# Patient Record
Sex: Female | Born: 1945
Health system: Southern US, Community
[De-identification: ages and names within clinical notes are randomized; demographics above are authoritative.]

## PROBLEM LIST (undated history)

## (undated) DIAGNOSIS — D496 Neoplasm of unspecified behavior of brain: Secondary | ICD-10-CM

## (undated) DIAGNOSIS — I10 Essential (primary) hypertension: Secondary | ICD-10-CM

## (undated) DIAGNOSIS — J479 Bronchiectasis, uncomplicated: Secondary | ICD-10-CM

## (undated) DIAGNOSIS — T7840XA Allergy, unspecified, initial encounter: Secondary | ICD-10-CM

## (undated) DIAGNOSIS — E119 Type 2 diabetes mellitus without complications: Secondary | ICD-10-CM

## (undated) HISTORY — DX: Type 2 diabetes mellitus without complications: E11.9

## (undated) HISTORY — PX: BREAST BIOPSY: SHX20

## (undated) HISTORY — DX: Allergy, unspecified, initial encounter: T78.40XA

## (undated) HISTORY — PX: ABDOMINAL HYSTERECTOMY: SHX81

## (undated) HISTORY — DX: Neoplasm of unspecified behavior of brain: D49.6

## (undated) HISTORY — PX: HAND SURGERY: SHX662

## (undated) HISTORY — DX: Essential (primary) hypertension: I10

## (undated) HISTORY — DX: Bronchiectasis, uncomplicated: J47.9

## (undated) HISTORY — PX: STOMACH SURGERY: SHX791

---

## 2013-02-22 DIAGNOSIS — E669 Obesity, unspecified: Secondary | ICD-10-CM | POA: Insufficient documentation

## 2013-02-22 DIAGNOSIS — F411 Generalized anxiety disorder: Secondary | ICD-10-CM | POA: Insufficient documentation

## 2013-02-22 DIAGNOSIS — R32 Unspecified urinary incontinence: Secondary | ICD-10-CM | POA: Insufficient documentation

## 2013-06-19 DIAGNOSIS — K219 Gastro-esophageal reflux disease without esophagitis: Secondary | ICD-10-CM | POA: Insufficient documentation

## 2014-01-23 DIAGNOSIS — K5909 Other constipation: Secondary | ICD-10-CM | POA: Insufficient documentation

## 2014-01-23 DIAGNOSIS — E785 Hyperlipidemia, unspecified: Secondary | ICD-10-CM | POA: Insufficient documentation

## 2014-01-23 DIAGNOSIS — I6529 Occlusion and stenosis of unspecified carotid artery: Secondary | ICD-10-CM | POA: Insufficient documentation

## 2014-01-23 DIAGNOSIS — L209 Atopic dermatitis, unspecified: Secondary | ICD-10-CM | POA: Insufficient documentation

## 2014-01-23 DIAGNOSIS — D649 Anemia, unspecified: Secondary | ICD-10-CM | POA: Insufficient documentation

## 2014-01-23 DIAGNOSIS — E7800 Pure hypercholesterolemia, unspecified: Secondary | ICD-10-CM | POA: Insufficient documentation

## 2014-01-23 DIAGNOSIS — J309 Allergic rhinitis, unspecified: Secondary | ICD-10-CM | POA: Insufficient documentation

## 2014-05-15 DIAGNOSIS — E876 Hypokalemia: Secondary | ICD-10-CM | POA: Insufficient documentation

## 2014-09-21 DIAGNOSIS — I10 Essential (primary) hypertension: Secondary | ICD-10-CM | POA: Insufficient documentation

## 2015-01-11 DIAGNOSIS — K5730 Diverticulosis of large intestine without perforation or abscess without bleeding: Secondary | ICD-10-CM | POA: Insufficient documentation

## 2015-02-04 DIAGNOSIS — H43393 Other vitreous opacities, bilateral: Secondary | ICD-10-CM | POA: Insufficient documentation

## 2015-02-04 DIAGNOSIS — H43811 Vitreous degeneration, right eye: Secondary | ICD-10-CM | POA: Insufficient documentation

## 2015-05-25 DIAGNOSIS — Z9889 Other specified postprocedural states: Secondary | ICD-10-CM | POA: Insufficient documentation

## 2015-08-26 DIAGNOSIS — C49A0 Gastrointestinal stromal tumor, unspecified site: Secondary | ICD-10-CM | POA: Insufficient documentation

## 2015-09-14 DIAGNOSIS — N6310 Unspecified lump in the right breast, unspecified quadrant: Secondary | ICD-10-CM | POA: Insufficient documentation

## 2015-09-14 DIAGNOSIS — N2889 Other specified disorders of kidney and ureter: Secondary | ICD-10-CM | POA: Insufficient documentation

## 2015-10-21 DIAGNOSIS — J471 Bronchiectasis with (acute) exacerbation: Secondary | ICD-10-CM | POA: Insufficient documentation

## 2016-01-22 DIAGNOSIS — E11319 Type 2 diabetes mellitus with unspecified diabetic retinopathy without macular edema: Secondary | ICD-10-CM | POA: Insufficient documentation

## 2016-01-22 DIAGNOSIS — H2513 Age-related nuclear cataract, bilateral: Secondary | ICD-10-CM | POA: Insufficient documentation

## 2016-02-01 DIAGNOSIS — R7989 Other specified abnormal findings of blood chemistry: Secondary | ICD-10-CM | POA: Insufficient documentation

## 2016-02-23 DIAGNOSIS — M810 Age-related osteoporosis without current pathological fracture: Secondary | ICD-10-CM | POA: Insufficient documentation

## 2016-06-15 DIAGNOSIS — G8929 Other chronic pain: Secondary | ICD-10-CM | POA: Insufficient documentation

## 2016-12-06 DIAGNOSIS — E119 Type 2 diabetes mellitus without complications: Secondary | ICD-10-CM | POA: Insufficient documentation

## 2017-05-10 DIAGNOSIS — R55 Syncope and collapse: Secondary | ICD-10-CM | POA: Insufficient documentation

## 2018-09-19 DIAGNOSIS — R300 Dysuria: Secondary | ICD-10-CM | POA: Diagnosis not present

## 2018-09-19 DIAGNOSIS — I10 Essential (primary) hypertension: Secondary | ICD-10-CM | POA: Diagnosis not present

## 2018-09-19 DIAGNOSIS — E119 Type 2 diabetes mellitus without complications: Secondary | ICD-10-CM | POA: Diagnosis not present

## 2018-09-19 DIAGNOSIS — R8271 Bacteriuria: Secondary | ICD-10-CM | POA: Diagnosis not present

## 2018-09-19 DIAGNOSIS — E7800 Pure hypercholesterolemia, unspecified: Secondary | ICD-10-CM | POA: Diagnosis not present

## 2018-09-19 DIAGNOSIS — Z794 Long term (current) use of insulin: Secondary | ICD-10-CM | POA: Diagnosis not present

## 2018-09-19 DIAGNOSIS — N3000 Acute cystitis without hematuria: Secondary | ICD-10-CM | POA: Diagnosis not present

## 2018-09-26 DIAGNOSIS — E119 Type 2 diabetes mellitus without complications: Secondary | ICD-10-CM | POA: Diagnosis not present

## 2018-09-26 DIAGNOSIS — I10 Essential (primary) hypertension: Secondary | ICD-10-CM | POA: Diagnosis not present

## 2018-09-26 DIAGNOSIS — R300 Dysuria: Secondary | ICD-10-CM | POA: Diagnosis not present

## 2018-09-26 DIAGNOSIS — E7800 Pure hypercholesterolemia, unspecified: Secondary | ICD-10-CM | POA: Diagnosis not present

## 2018-10-04 ENCOUNTER — Other Ambulatory Visit: Payer: Self-pay

## 2018-10-04 DIAGNOSIS — J471 Bronchiectasis with (acute) exacerbation: Secondary | ICD-10-CM | POA: Diagnosis not present

## 2018-10-04 NOTE — Patient Outreach (Signed)
[facility]) Care Management  10/04/2018  Azhane Eckart 08/06/1946 [id number]   Referral Date: 10/04/2018 Referral Source: Nurse line Referral Reason: trouble breathing   Outreach Attempt: spoke with patient.  She is able to verify HIPAA.  Discussed reason for call.  She states that she did go to the urgent care attached to her pulmonary's office and she was given two prescriptions and she has had them filled.  She states one was an antibiotic but did not have the names. She states she feels some better and getting ready to go out.  Cautioned patient on going out and to make sure she is protected from the cold.  She verbalized understanding and denies any concerns.     Plan: RN CM will send in basket to C-SNP nurse of call and sign off case.      Jone Baseman, RN, MSN [facility] Care Management Care Management Coordinator Direct Line [phone] Toll Free: [phone]  Fax: [phone]

## 2018-10-10 ENCOUNTER — Other Ambulatory Visit: Payer: Self-pay

## 2018-10-10 NOTE — Patient Outreach (Signed)
  [facility]) Care Management Chronic Special Needs Program    10/10/2018  Name: Julea Hutto, DOB: 1945-10-22  MRN: [id number]    [facility]) Care Management Chronic Special Needs Program  10/10/2018  Name: Judie Hollick DOB: July 27, 1946  MRN: [id number]  Ms. Niomi Valent is enrolled in a Chronic Special Needs Plan.   Nurse advice line call received today: troubles with her breathing and has diabetes. Saw pulmonologist last week and prescribed prednisone and azithromycin (per record: finished prescriptions yesterday).   RNCM called follow up on nurse advice call; review health risk assessment and complete individualized care plan. Client answered. She reports she is feeling better then this morning when she called the nurse advice line. She states she has an appointment with her primary care on tomorrow. But states she has company and request a call back to complete call.    Plan: Chronic care management coordinator will attempt outreach in about an hour per client request.   Thea Silversmith, RN, MSN, [place] [phone]

## 2018-10-10 NOTE — Patient Outreach (Signed)
[facility]) Care Management Chronic Special Needs Program  10/10/2018  Name: Candice Pineda DOB: [birth year]  MRN: [id number]  Candice Pineda is enrolled in a chronic special needs plan for Diabetes. Chronic Care Management Coordinator telephoned client to review health risk assessment and to develop individualized care plan.  Introduced the chronic care management program, importance of client participation, and taking their care plan to all provider appointments and inpatient facilities.  Reviewed the transition of care process and possible referral to community care management.  Subjective: client reports she is feeling better, but still has periods of shortness of breath and feeling tired. Client reports she has an appointment with her primary care provider tomorrow.Client reports history of bronchiectasis. She states she is supposed to be using Symbicort inhaler, but is not able to afford it. She denies any other issues with her medications. Client is also requesting advanced directive information.  Outpatient Encounter Medications as of 10/10/2018  Medication Sig  . aspirin EC 81 MG tablet Take 81 mg by mouth daily.  . calcium-vitamin D (OSCAL WITH D) 500-200 MG-UNIT tablet Take 1 tablet by mouth 2 (two) times daily.  . ferrous sulfate 325 (65 FE) MG tablet Take 325 mg by mouth daily with breakfast.  . fluticasone (FLONASE) 50 MCG/ACT nasal spray Place 1 spray into both nostrils daily as needed for allergies or rhinitis.  . folic acid (FOLVITE) 762 MCG tablet Take 400 mcg by mouth daily.  . hydrochlorothiazide (HYDRODIURIL) 25 MG tablet Take 25 mg by mouth daily.  . insulin NPH-regular Human (70-30) 100 UNIT/ML injection Inject 15 Units into the skin 2 (two) times daily with a meal.  . losartan (COZAAR) 50 MG tablet Take 50 mg by mouth daily.  . metFORMIN (GLUCOPHAGE) 500 MG tablet Take 500 mg by mouth 2 (two) times daily with a meal.  . omeprazole (PRILOSEC) 40 MG capsule Take  40 mg by mouth daily.  . potassium chloride SA (K-DUR,KLOR-CON) 20 MEQ tablet Take 20 mEq by mouth 2 (two) times daily.  . simvastatin (ZOCOR) 40 MG tablet Take 40 mg by mouth daily.  . vitamin C (ASCORBIC ACID) 500 MG tablet Take 500 mg by mouth daily.  . budesonide-formoterol (SYMBICORT) 80-4.5 MCG/ACT inhaler Inhale 2 puffs into the lungs 2 (two) times daily.   No facility-administered encounter medications on file as of 10/10/2018.     Goals Addressed            This Visit's Progress   . Client understands the importance of follow-up with providers by attending scheduled visits      . Client will increase activity tolerance as evidence by verbalization of decrease shortness of breath with activity within the next 49months       Follow up with your doctor for recommendations. Silver Sneakers Benefit.     . Client will report abillity to obtain Medications within the next 6 months      . Client will use Assistive Devices as needed and verbalize understanding of device use      . Client will verbalize knowledge of chronic lung disease as evidenced by no ED visits or Inpatient stays related to chronic lung disease       . Client will verbalize knowledge of self management of Hypertension as evidences by BP reading of 140/90 or less; or as defined by provider      . HEMOGLOBIN A1C < 7.0      . Maintain timely refills of diabetic medication as prescribed  within the year .      [place] Obtain annual  Lipid Profile, LDL-C      . Obtain Annual Eye (retinal)  Exam       . Obtain Annual Foot Exam      . Obtain annual screen for micro albuminuria (urine) , nephropathy (kidney problems)      . Obtain Hemoglobin A1C at least 2 times per year      . Visit Primary Care Provider or Endocrinologist at least 2 times per year          Plan:  Send successful outreach letter with a copy of their individualized care plan, Send individual care plan to provider and Send educational material.  Chronic care  management coordination will follow up next month.  Will refer client to:  Social Work and Pharmacy   RNCM will increase to Moderate Tier due to reports of pulmonary issues/difficulty getting inhaler.   Thea Silversmith, RN, MSN, [place] [phone]

## 2018-10-11 ENCOUNTER — Telehealth: Payer: Self-pay | Admitting: Pharmacist

## 2018-10-11 DIAGNOSIS — E119 Type 2 diabetes mellitus without complications: Secondary | ICD-10-CM | POA: Diagnosis not present

## 2018-10-11 DIAGNOSIS — R0602 Shortness of breath: Secondary | ICD-10-CM | POA: Diagnosis not present

## 2018-10-11 DIAGNOSIS — R5383 Other fatigue: Secondary | ICD-10-CM | POA: Diagnosis not present

## 2018-10-11 DIAGNOSIS — R05 Cough: Secondary | ICD-10-CM | POA: Diagnosis not present

## 2018-10-11 NOTE — Patient Outreach (Signed)
[facility]) Care Management  [place]   10/11/2018  Candice Pineda Jun 13, 1946 [id number]  Reason for referral: Medication Assistance with Symbicort  Referral source: [employer] Current insurance:[employer]  PMHx includes but not limited to: COPD, T2DM, HTN, HLD  Outreach:  Successful telephone call with Ms. Maselli.  HIPAA identifiers verified. Patient agreeable to review medications telephonically.  Subjective:  Patient does not have a Symbicort inhaler nor a rescue inhaler. Patient reports she has not tried to fill her Symbicort this year. She reports checking her BG twice daily.   Objective:  Allergies  Allergen Reactions  . Tetanus Toxoids     Arm extremely painful, arm turned red and swollen    Medications Reviewed Today    Reviewed by Elayne Guerin, [facility] (Pharmacist) on 10/11/18 at [place] List Status: <None>  Medication Order Taking? Sig Documenting Provider Last Dose Status Informant  albuterol (VENTOLIN HFA) 108 (90 Base) MCG/ACT inhaler [id number] No INHALE 2 PUFFS EVERY 4 HOURS AS NEEDED WHEEZING AND FOR SHORTNESS OF BREATH [provider] Not Taking Active   aspirin EC 81 MG tablet [id number] Yes Take 81 mg by mouth daily. [provider] Taking Active Self  Blood Glucose Monitoring Suppl (ACCU-CHEK AVIVA) device [id number] Yes Use daily to check blood sugar daily [provider] Taking Active   budesonide-formoterol (SYMBICORT) 80-4.5 MCG/ACT inhaler [id number] No Inhale 2 puffs into the lungs 2 (two) times daily. [provider] Not Taking Active Self  calcium-vitamin D (OSCAL WITH D) 500-200 MG-UNIT tablet [id number] Yes Take 1 tablet by mouth 2 (two) times daily. [provider] Taking Active   ferrous sulfate 325 (65 FE) MG tablet [id number] Yes Take 325 mg by mouth daily with breakfast. [provider] Taking Active Self  fluticasone (FLONASE) 50 MCG/ACT nasal spray  [id number] Yes Place 1 spray into both nostrils daily as needed for allergies or rhinitis. [provider] Taking Active Self  folic acid (FOLVITE) 024 MCG tablet [id number] Yes Take 400 mcg by mouth daily. [provider] Taking Active Self  hydrochlorothiazide (HYDRODIURIL) 25 MG tablet [id number] Yes Take 25 mg by mouth daily. [provider] Taking Active Self  insulin NPH-regular Human (70-30) 100 UNIT/ML injection [id number] Yes Inject 15 Units into the skin 2 (two) times daily with a meal. [provider] Taking Active Self  LORazepam (ATIVAN) 0.5 MG tablet [id number] Yes Take 1/2 - 1 tab po qhs prn anxiety or insomnia [provider] Taking Active   losartan (COZAAR) 50 MG tablet [id number] Yes Take 50 mg by mouth daily. [provider] Taking Active Self  metFORMIN (GLUCOPHAGE) 500 MG tablet [id number] Yes Take 500 mg by mouth 2 (two) times daily with a meal. [provider] Taking Active Self  omeprazole (PRILOSEC) 40 MG capsule [id number] Yes Take 40 mg by mouth daily. [provider] Taking Active Self  potassium chloride SA (K-DUR,KLOR-CON) 20 MEQ tablet [id number] Yes Take 20 mEq by mouth 2 (two) times daily. [provider] Taking Active Self  simvastatin (ZOCOR) 40 MG tablet [id number] Yes Take 40 mg by mouth daily. [provider] Taking Active Self  vitamin C (ASCORBIC ACID) 500 MG tablet [id number] Yes Take 500 mg by mouth daily. [provider] Taking Active Self          Assessment:  Care coordination call to [facility] who report no Symbicort prescription on file.  Care coordination call to Dr. Elijio Miles, patient's pulmonologist. Left  urgent message that patient does not have any active prescriptions for inhalers (specficially Symbicort) at her pharmacy and requested prescription be sent in.   Drugs sorted by system:  Neurologic: lorazepam   Cardiovascular: aspirin 81mg ,  hydrochlorothiazide, losartan, simvastatin  Pulmonary/Allergy: fluticasone nasal   Gastrointestinal: omeprazole   Endocrine: insulin NPH- regular 70/30, metformin  Vitamins/Minerals/Supplements: calcium-vitamin D, ferrous sulfate, folic acid, potassium, vitamin C  Medication Review Findings:  . Omeprazole: patient's bottle omeprazole says take twice a day, but patient is taking once a day . Metformin: could be further optimized to 1000mg  BID given uncontrolled A1c 8.4%  . Insulin NPH-regular: could be optimized on a long-acting regimen      Plan: Will follow up with patient next week to apply for patient assistance.    [place] Faster PharmD Candidate Class of 2020  [facility]

## 2018-10-14 ENCOUNTER — Other Ambulatory Visit: Payer: Self-pay | Admitting: Pharmacist

## 2018-10-14 ENCOUNTER — Other Ambulatory Visit: Payer: Self-pay

## 2018-10-14 NOTE — Patient Outreach (Signed)
  [facility]) Care Management Chronic Special Needs Program    10/14/2018  Name: Candice Pineda, DOB: 1945-11-11  MRN: [id number]   [name] is enrolled in a chronic special needs plan for Diabetes.   RNCM returned call to client-Client left voice message this morning regarding cost of her inhaler. RNCM spoke with [facility] pharmacist, Denyse Amass who has already been in contact today and is working on client's medication issue. RNCM spoke with client and she has no other needs at this time.   Plan: continue to follow as previously scheduled.  Thea Silversmith, RN, MSN, [place] [phone]

## 2018-10-14 NOTE — Patient Outreach (Signed)
[place] [facility]) Care Management  10/14/2018  Bailey Faiella [birth year] [id number]   Patient was called to follow up on medication assistance. HIPAA identifiers were obtained. Patient confirmed that a Spiriva prescription was sent to her pharmacy but when she went to pick it up, the copay was $90.  [facility] pharmacy was called and the Pharmacy Technician said the prescription was filled for generic Symbicort. She was asked to rebill the prescription and fill it for the brand name Symbicort as generic Symbicort is a tier 4 medication on [place] formulary. Brand name Symbicort is a tier 3 medications which carries a $45 copay.   Although patient said she would be able to pay the $45 this time, it is cost prohibitive for her.  If her provider would agree, Symbicort could be substituted for [facility] and the patient could also get Proventil for PRN use if necessary. Both Dulera and Proventil HFA can be obtained from [facility] Patient Assistance Program at no cost.  Patient said she would go and pick up the Symbicort today.  An attempt was made to complete an Extra Help application for the patient today via telephone.  Unfortunately, there was an issue with her Social Security Number according to the website.   Plan: Send a barrier's letter to the patient's provider to request a therapeutic substitution and signature of Patient Assistance Forms for [facility] and Proventil HFA.  Send patient the [employer] Application so she can take it with her to her provider's office.   Since patient will have at least 1 month of therapy, follow up with her before her next visit with her Pulmonologist on 11/04/2018.   Elayne Guerin, PharmD, [place] Clinical Pharmacist [phone]

## 2018-10-14 NOTE — Patient Outreach (Signed)
[place] [facility]) Care Management  10/14/2018  Candice Pineda 20-Sep-1945 [id number]  Successful outreach to the patient on today's date, HIPAA identifiers confirmed. BSW introduced self to the patient and the reason for today's call, indicating the patient had been referred for completion of advance directives. The patient is interested in learning what advance directives are and why they are needed. BSW educated the patient on the importance of an advance directive and situations they would be utilized. The patient is agreeable to BSW mailing an advance directive to her home for review. BSW will plan to outreach the patient in the next three weeks to confirm receipt of resource. BSW will assist the patient with completion if desired by the patient.  Daneen Schick, BSW, CDP [facility] [phone]

## 2018-10-15 ENCOUNTER — Other Ambulatory Visit: Payer: Self-pay | Admitting: Pharmacy Technician

## 2018-10-15 NOTE — Patient Outreach (Signed)
[place] Candice Pineda [facility] Care Management  10/15/2018  [name] 11-21-1945 [id number]    Received patient assistance referral from [place] for [name] and Proventil HFA through [employer] patient assistance program.  Prepared patient and provider portions to be mailed to patient so that patient can take both parts of the application to her upcoming Pulmonary appointment.  Will followup with patient in 5-7 business days to confirm receipt of application.  Shawnda Mauney P. Emersyn Kotarski, [place] Management [phone]

## 2018-10-16 NOTE — Telephone Encounter (Signed)
This encounter was created in error - please disregard.

## 2018-10-21 ENCOUNTER — Other Ambulatory Visit: Payer: Self-pay

## 2018-10-21 NOTE — Patient Outreach (Signed)
  [place] [facility]) Care Management Chronic Special Needs Program    10/21/2018  Name: Candice Pineda, DOB: 1946/03/17  MRN: [id number]   Ms.LoisBracyis enrolled in a chronic special needs plan forDiabetes.  RNCM received voice message from client that she received an application for her inhaler. Client with questions regarding next steps in the process. RNCM relayed information to pharmacy with request to follow up with client.   Plan: RNCM will continue to follow.  Thea Silversmith, RN, MSN, [place] [phone]

## 2018-10-22 ENCOUNTER — Telehealth: Payer: Self-pay | Admitting: Pharmacist

## 2018-10-22 NOTE — Telephone Encounter (Signed)
-----   Message from Luretha Rued, RN sent at 10/21/2018 12:34 PM EST ----- Regarding: received call W[place] Fillers,  I received a call from Ms. Valentina Gu stating that she received the application for the inhaler and she had a question about what she is to do with the application. She mentioned something about a September 16th appointment. Can you follow up with her on this? Thank you for all you do.  Thea Silversmith, RN, MSN, [place] [phone]

## 2018-10-22 NOTE — Patient Outreach (Signed)
[place] [facility]) Care Management  10/22/2018  Teshia Mahone 1946-08-20 [id number]  Patient was called regarding medication assistance. Unfortunately , she did not answer the phone. HIPAA compliant message was left on her voicemail.  An inbasket message was sent from Thea Silversmith stating the patient had questions about her next steps.    The plan from the 10/14/2018 visit was as follows:   Send a barrier's letter to the patient's provider to request a therapeutic substitution and signature of Patient Assistance Forms for [facility] and Proventil HFA.  Send patient the [employer] Application so she can take it with her to her provider's office.   Since patient will have at least 1 month of therapy, follow up with her before her next visit with her Pulmonologist on 11/04/2018.   Plan: Call patient back in 3-5 business days.   Elayne Guerin, PharmD, [place] Clinical Pharmacist [phone]

## 2018-10-24 ENCOUNTER — Other Ambulatory Visit: Payer: Self-pay | Admitting: Pharmacist

## 2018-10-25 ENCOUNTER — Other Ambulatory Visit: Payer: Self-pay

## 2018-10-25 NOTE — Patient Outreach (Signed)
[facility]) Care Management  10/25/2018  Candice Pineda Jul 03, 1946 [id number]   Patient called me back. HIPAA identifiers were obtained. She was educated on taking the patient assistance forms she was given to her provider's office on 11/04/18.     Elayne Guerin, PharmD, [place] Clinical Pharmacist [phone]

## 2018-10-25 NOTE — Patient Outreach (Signed)
[facility] [facility]) Care Management  10/25/2018  Candice Pineda 1946-05-12 [id number]  Unsuccessful outreach to the patient to confirm receipt of mailed resources. BSW left a HIPAA compliant voice message requesting a return call. BSW will attempt to contact the patient within the next four business days.  Daneen Schick, BSW, CDP [facility] [phone]

## 2018-10-28 ENCOUNTER — Ambulatory Visit: Payer: Self-pay

## 2018-10-28 ENCOUNTER — Other Ambulatory Visit: Payer: Self-pay

## 2018-10-28 NOTE — Patient Outreach (Signed)
[place] [facility]) Care Management  10/28/2018  Lacinda Curvin 1946/08/03 [id number]  Second unsuccessful outreach attempt to confirm receipt of mailed advance directive packet and assist with completion. BSW left a HIPAA compliant voice message requesting a return call. BSW will mail the patient an unsuccessful outreach letter. The patient will be contacted within the next week as a third and final outreach attempt.  Daneen Schick, BSW, CDP [facility] [phone]

## 2018-10-30 ENCOUNTER — Ambulatory Visit: Payer: PPO | Admitting: Pharmacist

## 2018-10-31 ENCOUNTER — Ambulatory Visit: Payer: PPO

## 2018-11-01 ENCOUNTER — Ambulatory Visit: Payer: Self-pay

## 2018-11-01 ENCOUNTER — Ambulatory Visit: Payer: Self-pay | Admitting: Pharmacist

## 2018-11-01 ENCOUNTER — Other Ambulatory Visit: Payer: Self-pay

## 2018-11-01 NOTE — Patient Outreach (Signed)
[place] [facility]) Care Management  11/01/2018  Candice Pineda 1946/02/24 [id number]   Third unsuccessful outreach attempt to confirm receipt of mailed advance directive packet and assist with completion. BSW left a HIPAA compliant voice message requesting a return call. Unsuccessful outreach letter mailed by Nadara Eaton, on 10/28/18.  BSW closing case due to inability to contact.   Ronn Melena, BSW Social Worker [phone]

## 2018-11-04 DIAGNOSIS — J310 Chronic rhinitis: Secondary | ICD-10-CM | POA: Diagnosis not present

## 2018-11-04 DIAGNOSIS — J479 Bronchiectasis, uncomplicated: Secondary | ICD-10-CM | POA: Diagnosis not present

## 2018-11-06 ENCOUNTER — Other Ambulatory Visit: Payer: Self-pay | Admitting: Pharmacist

## 2018-11-06 ENCOUNTER — Ambulatory Visit: Payer: Self-pay | Admitting: Pharmacist

## 2018-11-06 NOTE — Patient Outreach (Addendum)
[place] [facility]) Care Management  11/06/2018  Candice Pineda [birth year] [id number]   Patient was called regarding medication assistance and to follow up after her pulmonary appointment at [facility].  According to the note from Dr. Greggory Stallion on 11/04/2018, the patient is still on Symbicort and has not been changed to [facility] as requested. She was started on a prednisone taper.  Patient was sent the actual application to take to her provider's office. A barrier's letter was also sent to her provider's office on 10/14/2018.  Plan:  Call patient back in 5-7 business days.   Elayne Guerin, PharmD, [place] Clinical Pharmacist [phone]

## 2018-11-07 ENCOUNTER — Other Ambulatory Visit: Payer: Self-pay | Admitting: Pharmacist

## 2018-11-07 NOTE — Patient Outreach (Signed)
[place] [facility]) Care Management  11/07/2018  Candice Pineda [birth year] [id number]   Patient called me back. HIPAA identifiers were obtained. Patient confirmed she went to her pulmonology appointment on 11/04/2018 and that she took the patient assistance forms for [facility] to her provider.    A barrier's letter was sent to her provider last month explaining that the patient was having difficulty affording Symbicort but had not spent the required $600 in out-of-pocket medication expenses that is required by the AZ&Me Program. If deemed therapeutically appropriate by her provider, she could be switched to [facility] which is available from [employer] and does not require an out-of-pocket spend. The barriers letter also requested that the signed application be sent back to [facility] for [place] to handle the correspondence with the program. (In an effort to alleviate stress for the provider.)  Her office visit note from 11/04/2018 was reviewed and there was no mention of Dulera.  Patient confirmed that she gave all of her information to her provider's nurse who said she would fax the application in for her.  Patient said her provider said she should be able to get Symbicort at no cost for 1 year.  That is true IF she spends the $600 in out-of-pocket medication expenses (which she has not and proof would need to accompany the application).  Plan: Left a message for Kellie Simmering CMA to give me a call back for clarification Route note to [facility] Follow up in 1 week. Close pharmacy episode if the provider's office will be taking over patient assistance.   Candice Pineda, PharmD, [place] Clinical Pharmacist [phone]

## 2018-11-08 ENCOUNTER — Other Ambulatory Visit: Payer: Self-pay | Admitting: Pharmacist

## 2018-11-08 NOTE — Patient Outreach (Signed)
[place] [facility]) Care Management  11/08/2018  Adhira Jamil [birth year] [id number]   Butch Penny from [place] office called me back. I explained to her why we sent an application for [facility] to their office (because the patient has not met the out-of-pocket expenditure to receive Symbicort at no cost from AZ&Me).    Butch Penny said she would discuss Dulera with Elijio Miles and if approved send the application directly from their office to [employer].  Plan: Close patient's pharmacy case as her medication assistance will be handled by her provider.   Patient is still part of the CSNP program.   Elayne Guerin, PharmD, [place] Clinical Pharmacist [phone]

## 2018-11-11 ENCOUNTER — Other Ambulatory Visit: Payer: Self-pay

## 2018-11-11 ENCOUNTER — Other Ambulatory Visit: Payer: Self-pay | Admitting: Pharmacist

## 2018-11-11 NOTE — Patient Outreach (Signed)
[place] [facility]) Care Management  11/11/2018  Aracely Rickett June 05, 1946 [id number]   Jasmin from Dr. Elane Fritz office called regarding Ruthe Mannan for Ms. Demchak.  Jasmin and I discussed Dulera and Proventil HFA being available from [employer]. Jasmin said she would download the application and complete the process with the patient.  Elayne Guerin, PharmD, [place] Clinical Pharmacist [phone]  (Pharmacy case will remain closed)

## 2018-11-11 NOTE — Patient Outreach (Signed)
  [place] [facility]) Care Management Chronic Special Needs Program  11/11/2018  Name: Candice Pineda DOB: Mar 12, 1946  MRN: [id number]  [name] is enrolled in a chronic special needs plan for Diabetes. Reviewed and updated care plan.  Subjective: Client reports feeling better and reports breathing improved. She reports her doctors are completing paperwork for her inhaler. RNCM reinforced what she needs to do to get more strips for her meter. She denies any other issues or concerns.   Goals Addressed            This Visit's Progress   . Client understands the importance of follow-up with providers by attending scheduled visits   On track   . Client will increase activity tolerance as evidence by verbalization of decrease shortness of breath with activity within the next 37months   On track    Follow up with your doctor for recommendations. Silver Sneakers Benefit.     . Client will report abillity to obtain Medications within the next 6 months   On track   . Client will use Assistive Devices as needed and verbalize understanding of device use   On track   . Client will verbalize knowledge of chronic lung disease as evidenced by no ED visits or Inpatient stays related to chronic lung disease    On track   . Client will verbalize knowledge of self management of Hypertension as evidences by BP reading of 140/90 or less; or as defined by provider   On track   . Maintain timely refills of diabetic medication as prescribed within the year .   On track   . Obtain annual  Lipid Profile, LDL-C   On track   . Obtain Annual Eye (retinal)  Exam    On track   . Obtain Annual Foot Exam   On track   . Obtain annual screen for micro albuminuria (urine) , nephropathy (kidney problems)   On track   . Obtain Hemoglobin A1C at least 2 times per year   On track   . Visit Primary Care Provider or Endocrinologist at least 2 times per year    On track      Plan:  Send updated care plan to  client. Chronic care management coordinator will outreach in:  6 Months   Thea Silversmith, RN, MSN, [place] [phone]   .

## 2018-11-13 DIAGNOSIS — E119 Type 2 diabetes mellitus without complications: Secondary | ICD-10-CM | POA: Diagnosis not present

## 2018-11-13 DIAGNOSIS — L600 Ingrowing nail: Secondary | ICD-10-CM | POA: Diagnosis not present

## 2018-11-15 ENCOUNTER — Ambulatory Visit: Payer: Self-pay | Admitting: Pharmacist

## 2018-11-15 DIAGNOSIS — H524 Presbyopia: Secondary | ICD-10-CM | POA: Diagnosis not present

## 2018-11-15 DIAGNOSIS — H0100A Unspecified blepharitis right eye, upper and lower eyelids: Secondary | ICD-10-CM | POA: Diagnosis not present

## 2018-11-15 DIAGNOSIS — Z794 Long term (current) use of insulin: Secondary | ICD-10-CM | POA: Diagnosis not present

## 2018-11-15 DIAGNOSIS — H52203 Unspecified astigmatism, bilateral: Secondary | ICD-10-CM | POA: Insufficient documentation

## 2018-11-15 DIAGNOSIS — H2513 Age-related nuclear cataract, bilateral: Secondary | ICD-10-CM | POA: Diagnosis not present

## 2018-11-15 DIAGNOSIS — E113292 Type 2 diabetes mellitus with mild nonproliferative diabetic retinopathy without macular edema, left eye: Secondary | ICD-10-CM | POA: Diagnosis not present

## 2018-11-19 ENCOUNTER — Other Ambulatory Visit: Payer: Self-pay | Admitting: Pharmacist

## 2018-11-19 NOTE — Patient Outreach (Signed)
[place] [facility]) Care Management  11/19/2018  Candice Pineda 1946-09-15 [id number]   Patient called to inquire about Symbicort vs Dulera. HIPAA identifies were obtained. Patient said she was approved for LIS. She went to pick up Symbicort from her local pharmacy and her copay was $8.95.  Patient was educated to ask her provider to send all her chronic medications in for 90-day supplies.  Since patient has Full LIS, she will not be affected by the donut hole.  Plan: Patient's case will remain closed.  Elayne Guerin, PharmD, [place] Clinical Pharmacist [phone]

## 2019-01-29 DIAGNOSIS — R5382 Chronic fatigue, unspecified: Secondary | ICD-10-CM | POA: Diagnosis not present

## 2019-01-29 DIAGNOSIS — E119 Type 2 diabetes mellitus without complications: Secondary | ICD-10-CM | POA: Diagnosis not present

## 2019-02-05 DIAGNOSIS — I6521 Occlusion and stenosis of right carotid artery: Secondary | ICD-10-CM | POA: Diagnosis not present

## 2019-02-05 DIAGNOSIS — E119 Type 2 diabetes mellitus without complications: Secondary | ICD-10-CM | POA: Diagnosis not present

## 2019-02-05 DIAGNOSIS — K219 Gastro-esophageal reflux disease without esophagitis: Secondary | ICD-10-CM | POA: Diagnosis not present

## 2019-02-05 DIAGNOSIS — E7800 Pure hypercholesterolemia, unspecified: Secondary | ICD-10-CM | POA: Diagnosis not present

## 2019-02-07 DIAGNOSIS — I6523 Occlusion and stenosis of bilateral carotid arteries: Secondary | ICD-10-CM | POA: Diagnosis not present

## 2019-02-07 DIAGNOSIS — I6521 Occlusion and stenosis of right carotid artery: Secondary | ICD-10-CM | POA: Diagnosis not present

## 2019-02-19 DIAGNOSIS — K640 First degree hemorrhoids: Secondary | ICD-10-CM | POA: Diagnosis not present

## 2019-02-19 DIAGNOSIS — K5903 Drug induced constipation: Secondary | ICD-10-CM | POA: Diagnosis not present

## 2019-02-28 DIAGNOSIS — Z08 Encounter for follow-up examination after completed treatment for malignant neoplasm: Secondary | ICD-10-CM | POA: Diagnosis not present

## 2019-02-28 DIAGNOSIS — Z8509 Personal history of malignant neoplasm of other digestive organs: Secondary | ICD-10-CM | POA: Diagnosis not present

## 2019-02-28 DIAGNOSIS — C49A0 Gastrointestinal stromal tumor, unspecified site: Secondary | ICD-10-CM | POA: Diagnosis not present

## 2019-03-07 DIAGNOSIS — L0292 Furuncle, unspecified: Secondary | ICD-10-CM | POA: Diagnosis not present

## 2019-03-07 DIAGNOSIS — K644 Residual hemorrhoidal skin tags: Secondary | ICD-10-CM | POA: Diagnosis not present

## 2019-03-21 DIAGNOSIS — R21 Rash and other nonspecific skin eruption: Secondary | ICD-10-CM | POA: Diagnosis not present

## 2019-03-26 ENCOUNTER — Other Ambulatory Visit: Payer: Self-pay | Admitting: Pharmacist

## 2019-03-26 NOTE — Patient Outreach (Addendum)
[place] [facility]) Care Management  03/26/2019  Candice Pineda 1945/10/21 [id number]   Patient was called due to statin adherence. Unfortunately, she did not answer her phone. HIPAA compliant message was left on her voicemail.  [facility] said they did not have simvastatin on the patient's profile.    Care Everywhere was researched, simvastatin was still on the medication list from [place] and spoke with Ples Specter, CPhT.  Patient had Simvastatin filled on the following dates for a 30 day supply:  10/14/2018 11/10/2018 3/10/20020 12/16/2018 01/09/2019 02/06/2019 03/05/2019  Since she has LIS, she is eligible to have a 32-month supply filled for a $8.95.  Spoke with Karrie Meres, PharmD. Lennette Bihari said he spoke with Deanne at [facility] and was told they did not have any fill history of Simvastatin.   Plan: Await call back from patient. Call patient back in 5-7 business days.   Elayne Guerin, PharmD, [place] Clinical Pharmacist [phone]

## 2019-04-02 ENCOUNTER — Other Ambulatory Visit: Payer: Self-pay | Admitting: Pharmacist

## 2019-04-02 NOTE — Patient Outreach (Signed)
[place] [facility]) Care Management  04/02/2019  Cait Locust 08-Jul-1946 [id number]   Patient was called about simvastatin adherence. HIPAA identifiers were obtained.  Patient said she was still taking simvastatin despite there not being any fill history to support a refill.    Patient was asked to read off the fill date and she said 07/16/2005.  She said she uses old bottles.  It was discussed that her pharmacy did not have a fill history for simvastatin and also did not have it on her profile.  Patient's provider (Dr[facility] Loistine [facility]as at [facility]) was called and a message was left requesting a new prescription for a 90 day supply of  Simvastatin be sent to the patient's pharmacy.  Patient also wondered about a bill she received from her medical provider.   [facility] Concierge was called with the patient on conference call to decipher her bill.  Plan: Call patient back/follow up  in 7-10 business days about Simvastatin.   Elayne Guerin, PharmD, [place] Clinical Pharmacist [phone]

## 2019-04-04 DIAGNOSIS — Z20828 Contact with and (suspected) exposure to other viral communicable diseases: Secondary | ICD-10-CM | POA: Diagnosis not present

## 2019-04-04 DIAGNOSIS — M25511 Pain in right shoulder: Secondary | ICD-10-CM | POA: Diagnosis not present

## 2019-05-12 ENCOUNTER — Other Ambulatory Visit: Payer: Self-pay

## 2019-05-12 NOTE — Patient Outreach (Signed)
\   [place] [facility]) Care Management Chronic Special Needs Program  05/12/2019  Name: Candice Pineda DOB: 1946/09/11  MRN: [id number]  [name] is enrolled in a Chronic Special Needs Plan. RNCM called to follow up and review individualized care plan. No answer. HIPPA compliant message left.   Plan: Await return call. Chronic care management coordinator will attempt outreach witinin 1-2 weeks, if no return call.  Thea Silversmith, RN, MSN, [place] [phone]

## 2019-05-16 ENCOUNTER — Other Ambulatory Visit: Payer: Self-pay

## 2019-05-16 NOTE — Patient Outreach (Signed)
  [place] [facility]) Care Management Chronic Special Needs Program  05/16/2019  Name: Candice Pineda DOB: 05/26/1946  MRN: [id number]  [name] is enrolled in a Chronic Special Needs Plan. RNCM called to follow up and review individualized care plan. No answer. HIPPA compliant message left. 2nd outreach attempt to follow up.  Plan: Chronic care management coordinator will attempt outreach within 1-2 weeks.    Thea Silversmith, RN, MSN, [place] [phone]

## 2019-05-19 ENCOUNTER — Other Ambulatory Visit: Payer: Self-pay

## 2019-05-19 NOTE — Patient Outreach (Signed)
[place] [facility]) Care Management Chronic Special Needs Program  05/19/2019  Name: Candice Pineda DOB: 1946/04/01  MRN: [id number]  [name] is enrolled in a chronic special needs plan for Diabetes. Outreach to follow up, review and update care plan.  RNCM received return call from client.  Subjective: Client reports she is managing her health. She denies any difficulty with obtaining medications at this time. She reports she has been attending scheduled visits and has upcoming appointments with her pulmonologist on 05/23/2019, foot doctor on 05/29/2019 and primary care provider on 06/16/2019. She reports an eye exam is scheduled for December this year. Client reports her primary issue is that she is unable to pay her total electric bill and request if assistance if available. Client is agreeable to a [facility] social work referral.   Goals Addressed            This Visit's Progress   . COMPLETED: Client understands the importance of follow-up with providers by attending scheduled visits   On track    Voiced understanding    . Client will increase activity tolerance as evidence by verbalization of decrease shortness of breath with activity within the next 53months   On track    Continue to take medications as prescribed. Continue to attend follow up visits as scheduled.     . COMPLETED: Client will report abillity to obtain Medications within the next 6 months   On track    Voiced ability to obtain medications.    . COMPLETED: Client will use Assistive Devices as needed and verbalize understanding of device use   On track    Denies any difficulty with use of glucose meter.    . Client will verbalize knowledge of chronic lung disease as evidenced by no ED visits or Inpatient stays related to chronic lung disease    On track   . COMPLETED: Client will verbalize knowledge of self management of Hypertension as evidences by BP reading of 140/90 or less; or as defined by provider   On  track    Verbalized takes medications as prescribed, attends follow up appointments as scheduled, monitors salt intake.     . COMPLETED: HEMOGLOBIN A1C < 7       Per note Controlled A1C 7.4; Goal <8.    . Maintain timely refills of diabetic medication as prescribed within the year .   On track   . COMPLETED: Obtain annual  Lipid Profile, LDL-C       Done 01/29/2019    . Obtain Annual Eye (retinal)  Exam    On track    Reports upcoming appointment December 2020.    Marland Kitchen Obtain Annual Foot Exam   On track    Client reports upcoming visit scheduled for September 10,2020    . Obtain annual screen for micro albuminuria (urine) , nephropathy (kidney problems)   On track   . COMPLETED: Obtain Hemoglobin A1C at least 2 times per year       Per chart done 09/26/2018 and 02/05/2019    . COMPLETED: Visit Primary Care Provider or Endocrinologist at least 2 times per year        Per chart PCP at least two time/year.       Plan:  Send individual care plan to provider; send updated care plan to client. Chronic care management coordinator will outreach within 6 Months. Will refer to:  Social Work    Thea Silversmith, RN, MSN, [facility] Chronic Care Management Coordinator [place] [phone]   .

## 2019-05-21 ENCOUNTER — Other Ambulatory Visit: Payer: Self-pay

## 2019-05-21 ENCOUNTER — Ambulatory Visit: Payer: Self-pay

## 2019-05-21 NOTE — Patient Outreach (Signed)
[place] [facility]) Care Management  05/21/2019  Nairi Rominger 1946/07/13 [id number]   Social work referral received from [facility], Thea Silversmith, to assist patient with resources for paying past due electricity bill.   Unsuccessful outreach to patient today.  Left voicemail message and mailed Unsuccessful Outreach Letter. Will attempt to reach again within four business days.  Ronn Melena, BSW Social Worker [phone]

## 2019-05-22 ENCOUNTER — Other Ambulatory Visit: Payer: Self-pay

## 2019-05-22 NOTE — Patient Outreach (Signed)
[place] [facility]) Care Management  05/22/2019  Candice Pineda [birth year] [id number]   Social work referral received from [facility], Thea Silversmith, to assist patient with resources for paying past due electricity bill.   Left voicemail message for patient yesterday.  Did receive return call from her but was on phone with another patient at the time.  Attempted to call her back yesterday but she did not answer.   Called again today but had to leave another voicemail message.  Unsuccessful Outreach letter mailed yesterday.  Will make final attempt to reach again within four business days.  Ronn Melena, BSW Social Worker [phone]

## 2019-05-23 ENCOUNTER — Ambulatory Visit: Payer: HMO

## 2019-05-23 DIAGNOSIS — J309 Allergic rhinitis, unspecified: Secondary | ICD-10-CM | POA: Diagnosis not present

## 2019-05-23 DIAGNOSIS — J479 Bronchiectasis, uncomplicated: Secondary | ICD-10-CM | POA: Diagnosis not present

## 2019-05-23 DIAGNOSIS — Z23 Encounter for immunization: Secondary | ICD-10-CM | POA: Diagnosis not present

## 2019-05-27 ENCOUNTER — Other Ambulatory Visit: Payer: Self-pay

## 2019-05-27 NOTE — Patient Outreach (Signed)
[place] [facility]) Care Management  05/27/2019  Candice Pineda 1946-08-27 [id number]   Social work referral received from [facility], Thea Silversmith, to assist patient with resources for paying past due electricity bill.  Successful outreach to patient today.  Patient reports that she was able to "scrape together" the money to pay past due bill.  She did request that a list of resources be mailed to her for future use as she often has difficulty paying all of her bills.  She was provided with contact information for the following:  [place] [phone] [employer] [place] [phone] [place] [phone] [place] [employer] [phone] [place] [phone] [employer] [phone] [place] 211 Just dial 211.    Will follow up with her within the next two weeks to ensure receipt of resources.  Ronn Melena, BSW Social Worker [phone]

## 2019-05-29 DIAGNOSIS — M2042 Other hammer toe(s) (acquired), left foot: Secondary | ICD-10-CM | POA: Diagnosis not present

## 2019-05-29 DIAGNOSIS — E119 Type 2 diabetes mellitus without complications: Secondary | ICD-10-CM | POA: Diagnosis not present

## 2019-05-29 DIAGNOSIS — L603 Nail dystrophy: Secondary | ICD-10-CM | POA: Diagnosis not present

## 2019-05-29 DIAGNOSIS — M2041 Other hammer toe(s) (acquired), right foot: Secondary | ICD-10-CM | POA: Diagnosis not present

## 2019-06-03 DIAGNOSIS — E559 Vitamin D deficiency, unspecified: Secondary | ICD-10-CM | POA: Diagnosis not present

## 2019-06-03 DIAGNOSIS — E119 Type 2 diabetes mellitus without complications: Secondary | ICD-10-CM | POA: Diagnosis not present

## 2019-06-10 ENCOUNTER — Ambulatory Visit: Payer: Self-pay

## 2019-06-10 ENCOUNTER — Other Ambulatory Visit: Payer: Self-pay

## 2019-06-10 DIAGNOSIS — E119 Type 2 diabetes mellitus without complications: Secondary | ICD-10-CM | POA: Diagnosis not present

## 2019-06-10 DIAGNOSIS — E7800 Pure hypercholesterolemia, unspecified: Secondary | ICD-10-CM | POA: Diagnosis not present

## 2019-06-10 DIAGNOSIS — I10 Essential (primary) hypertension: Secondary | ICD-10-CM | POA: Diagnosis not present

## 2019-06-10 DIAGNOSIS — Z0000 Encounter for general adult medical examination without abnormal findings: Secondary | ICD-10-CM | POA: Diagnosis not present

## 2019-06-10 DIAGNOSIS — E559 Vitamin D deficiency, unspecified: Secondary | ICD-10-CM | POA: Diagnosis not present

## 2019-06-10 NOTE — Patient Outreach (Signed)
[place] [facility]) Care Management  06/10/2019  Candice Pineda August 29, 1946 [id number]   Attempted to contact patient to ensure receipt of financial resources mailed on 05/27/19.  Left voicemail message.  Will attempt to reach again within four business days.  Ronn Melena, BSW Social Worker [phone]

## 2019-06-13 ENCOUNTER — Ambulatory Visit: Payer: Self-pay

## 2019-06-13 ENCOUNTER — Other Ambulatory Visit: Payer: Self-pay

## 2019-06-13 NOTE — Patient Outreach (Signed)
[place] [facility]) Care Management  06/13/2019  Tanaysha Maready April 21, 1946 [id number]   Second unsuccessful outreach to patient to ensure receipt of financial resources on 05/27/19.  Will attempt to reach again within four business days.  Ronn Melena, BSW Social Worker [phone]

## 2019-06-16 ENCOUNTER — Other Ambulatory Visit: Payer: Self-pay

## 2019-06-16 ENCOUNTER — Ambulatory Visit: Payer: Self-pay

## 2019-06-16 NOTE — Patient Outreach (Signed)
[place] [facility]) Care Management  06/16/2019  Lajoyce Bonito 07-07-1946 [id number]   Third unsuccessful outreach to patient to ensure receipt of financial resources on 05/27/19.  Closing social work case due to inability to maintain contact.   Ronn Melena, BSW Social Worker [phone]

## 2019-06-24 DIAGNOSIS — S0990XA Unspecified injury of head, initial encounter: Secondary | ICD-10-CM | POA: Diagnosis not present

## 2019-06-24 DIAGNOSIS — M25561 Pain in right knee: Secondary | ICD-10-CM | POA: Diagnosis not present

## 2019-06-24 DIAGNOSIS — S0003XA Contusion of scalp, initial encounter: Secondary | ICD-10-CM | POA: Diagnosis not present

## 2019-06-24 DIAGNOSIS — M542 Cervicalgia: Secondary | ICD-10-CM | POA: Diagnosis not present

## 2019-06-24 DIAGNOSIS — E669 Obesity, unspecified: Secondary | ICD-10-CM | POA: Diagnosis not present

## 2019-06-24 DIAGNOSIS — S8991XA Unspecified injury of right lower leg, initial encounter: Secondary | ICD-10-CM | POA: Diagnosis not present

## 2019-06-24 DIAGNOSIS — S0093XA Contusion of unspecified part of head, initial encounter: Secondary | ICD-10-CM | POA: Diagnosis not present

## 2019-06-24 DIAGNOSIS — S8002XA Contusion of left knee, initial encounter: Secondary | ICD-10-CM | POA: Diagnosis not present

## 2019-06-24 DIAGNOSIS — S8001XA Contusion of right knee, initial encounter: Secondary | ICD-10-CM | POA: Diagnosis not present

## 2019-06-26 DIAGNOSIS — Z1231 Encounter for screening mammogram for malignant neoplasm of breast: Secondary | ICD-10-CM | POA: Diagnosis not present

## 2019-07-02 DIAGNOSIS — M25561 Pain in right knee: Secondary | ICD-10-CM | POA: Diagnosis not present

## 2019-07-02 DIAGNOSIS — M25562 Pain in left knee: Secondary | ICD-10-CM | POA: Diagnosis not present

## 2019-07-02 DIAGNOSIS — W101XXA Fall (on)(from) sidewalk curb, initial encounter: Secondary | ICD-10-CM | POA: Diagnosis not present

## 2019-07-02 DIAGNOSIS — R519 Headache, unspecified: Secondary | ICD-10-CM | POA: Diagnosis not present

## 2019-08-14 DIAGNOSIS — Z1211 Encounter for screening for malignant neoplasm of colon: Secondary | ICD-10-CM | POA: Diagnosis not present

## 2019-09-05 DIAGNOSIS — J479 Bronchiectasis, uncomplicated: Secondary | ICD-10-CM | POA: Diagnosis not present

## 2019-09-05 DIAGNOSIS — J301 Allergic rhinitis due to pollen: Secondary | ICD-10-CM | POA: Diagnosis not present

## 2019-09-09 DIAGNOSIS — R05 Cough: Secondary | ICD-10-CM | POA: Diagnosis not present

## 2019-09-09 DIAGNOSIS — J309 Allergic rhinitis, unspecified: Secondary | ICD-10-CM | POA: Diagnosis not present

## 2019-09-09 DIAGNOSIS — R062 Wheezing: Secondary | ICD-10-CM | POA: Diagnosis not present

## 2019-09-09 DIAGNOSIS — J479 Bronchiectasis, uncomplicated: Secondary | ICD-10-CM | POA: Diagnosis not present

## 2019-09-18 DIAGNOSIS — R05 Cough: Secondary | ICD-10-CM | POA: Diagnosis not present

## 2019-09-18 DIAGNOSIS — R062 Wheezing: Secondary | ICD-10-CM | POA: Diagnosis not present

## 2019-09-18 DIAGNOSIS — R197 Diarrhea, unspecified: Secondary | ICD-10-CM | POA: Diagnosis not present

## 2019-09-18 DIAGNOSIS — R0981 Nasal congestion: Secondary | ICD-10-CM | POA: Diagnosis not present

## 2019-09-18 DIAGNOSIS — J029 Acute pharyngitis, unspecified: Secondary | ICD-10-CM | POA: Diagnosis not present

## 2019-09-18 DIAGNOSIS — Z20828 Contact with and (suspected) exposure to other viral communicable diseases: Secondary | ICD-10-CM | POA: Diagnosis not present

## 2019-09-18 DIAGNOSIS — R5383 Other fatigue: Secondary | ICD-10-CM | POA: Diagnosis not present

## 2019-09-18 DIAGNOSIS — H9209 Otalgia, unspecified ear: Secondary | ICD-10-CM | POA: Diagnosis not present

## 2019-09-19 HISTORY — PX: COLONOSCOPY: SHX174

## 2019-10-07 DIAGNOSIS — Z1329 Encounter for screening for other suspected endocrine disorder: Secondary | ICD-10-CM | POA: Diagnosis not present

## 2019-10-07 DIAGNOSIS — I10 Essential (primary) hypertension: Secondary | ICD-10-CM | POA: Diagnosis not present

## 2019-10-07 DIAGNOSIS — E119 Type 2 diabetes mellitus without complications: Secondary | ICD-10-CM | POA: Diagnosis not present

## 2019-10-07 DIAGNOSIS — E7800 Pure hypercholesterolemia, unspecified: Secondary | ICD-10-CM | POA: Diagnosis not present

## 2019-10-13 DIAGNOSIS — E7800 Pure hypercholesterolemia, unspecified: Secondary | ICD-10-CM | POA: Diagnosis not present

## 2019-10-13 DIAGNOSIS — E119 Type 2 diabetes mellitus without complications: Secondary | ICD-10-CM | POA: Diagnosis not present

## 2019-10-13 DIAGNOSIS — R718 Other abnormality of red blood cells: Secondary | ICD-10-CM | POA: Diagnosis not present

## 2019-10-13 DIAGNOSIS — E113292 Type 2 diabetes mellitus with mild nonproliferative diabetic retinopathy without macular edema, left eye: Secondary | ICD-10-CM | POA: Diagnosis not present

## 2019-10-13 DIAGNOSIS — E559 Vitamin D deficiency, unspecified: Secondary | ICD-10-CM | POA: Diagnosis not present

## 2019-10-14 ENCOUNTER — Other Ambulatory Visit: Payer: Self-pay

## 2019-10-14 NOTE — Patient Outreach (Signed)
  [place] [facility]) Care Management Chronic Special Needs Program    10/14/2019  Name: Candice Pineda, DOB: 31-Oct-1945  MRN: [id number]   [name] is enrolled in a chronic special needs plan for Diabetes. RNCM called to complete health risk assessment and update individualized care plan. RNCM spoke with client who reports this is not a good time and request call be rescheduled. In addition client states someone called her to ask her the questions. However, health risk assessment is not available.   Plan RNCM will follow up call within the next 1-2 weeks to allow time to see if health risk assessment has already been completed.  Thea Silversmith, RN, MSN, [place] [phone]

## 2019-10-21 DIAGNOSIS — B028 Zoster with other complications: Secondary | ICD-10-CM | POA: Diagnosis not present

## 2019-11-04 ENCOUNTER — Other Ambulatory Visit: Payer: Self-pay

## 2019-11-04 NOTE — Patient Outreach (Signed)
[place] [facility]) Care Management Chronic Special Needs Program  11/04/2019  Name: Jlisa Brys DOB: Jun 28, 1946  MRN: [id number]  Ms. Tamikia Siebert is enrolled in a chronic special needs plan for Diabetes. Chronic Care Management Coordinator telephoned client to complete health risk assessment and to update individualized care plan.  Reviewed the chronic care management program, importance of client participation, and taking their care plan to all provider appointments and inpatient facilities.    Subjective: reports history of diabetes, lung problems states bronchiectasis and high blood pressure. She states she is independent with activities of daily living. She states she attends all provider visits and denies any transportation issues. She reports she does not have a blood pressure cuff and is unable to afford one. Client has not completed her advanced directive and is receptive to receiving another packet to complete at her leisure.   Medications reviewed. Client denies any difficulty obtaining  Medications and denies any questions.  Client with a question regarding usage of the OTC card.   Goals Addressed            This Visit's Progress   . COMPLETED: Client will increase activity tolerance as evidence by verbalization of decrease shortness of breath with activity within the next 43months       11/04/2019 client reports since beginning 2020 increase in activity tolerance-continues to be independent with activities of daily living.      . Client will verbalize knowledge of chronic lung disease as evidenced by no ED visits or Inpatient stays related to chronic lung disease    On track    Year 2020-No ED visits or hospitalizations related to chronic lung disease.  It is important for you to attend provider visits as scheduled for recommendations and needed prescriptions. Mailed education: "Bronchiectasis in adults". Please review and call if you have any questions. Discussed the  importance of having prescribed medications/inhalers and using as prescribed.     . Client will verbalize knowledge of self management of Hypertension as evidences by BP reading of 140/90 or less; or as defined by provider   On track    Follow up with your doctor as scheduled. Ask your doctor "what is my target blood pressure range". I am having a Blood Pressure cuff sent to you. Please call if you have any questions.   Monitor your blood pressure periodically and take to your doctor's appointment.  Continue to take your medications as recommended by your provider. Mailed education article: "High blood pressure: what you can do". Please call if you have any questions.    Marland Kitchen HEMOGLOBIN A1C < 8 (pt-stated)       Per chart note: primary care provider goal <8.   A1C 7.6 on 10/07/2019 Diabetes self management actions:  Glucose monitoring per provider recommendations  Eat Healthy  Check feet daily  Visit provider every 3-6 months as directed  Hbg A1C level every 3-6 months.  Eye Exam yearly  Ask your doctor, "what is my Target A1C goal?"  Ask your doctor, "what is my Target blood sugar range?"     . COMPLETED: Maintain timely refills of diabetic medication as prescribed within the year .       2020-Client denies any difficulty obtaining medication.    . Obtain annual  Lipid Profile, LDL-C   On track    Per review done 01/29/2019 Last done 10/07/2019.  It is important to follow up with your primary care provider as scheduled for yearly physical and recommended labs/procedures.    Marland Kitchen  Obtain Annual Eye (retinal)  Exam    On track    Client reports done-"I made all my appointments" It is important to follow up with your primary care provider as scheduled for recommended exam. Next appointment with eye doctor Opthalmology 02/25/2020    . Obtain Annual Foot Exam   On track    Client reports done year 2020.   Per chart foot exam done 10/13/2019 It is important to follow up with your  provider as scheduled for recommended exam.     . Obtain annual screen for micro albuminuria (urine) , nephropathy (kidney problems)   On track    Per client-Done 2020  It is important to follow up with your primary care provider as scheduled for recommended exam and labs.    Illa Level Hemoglobin A1C at least 2 times per year   On track    Per chart done 09/26/2018 and 02/05/2019  It is important to follow up with your primary care provider as scheduled for yearly physical and recommended labs.     . Visit Primary Care Provider or Endocrinologist at least 2 times per year    On track    It is important to follow up with your primary care provider as scheduled for yearly physical and recommended labs/procedures. If you do not have an annual exam scheduled, please call and schedule. Per chart: Next appointment with primary care provider 04/26/2020      Covid-19 precautions discussed. RNCM encouraged client to call 24 hour nurse advice line as needed. RNCM encouraged client to call healthcare concierge for benefits questions. Client encouraged to call RNCM for resource needs or with health questions or concerns.  Warm transfer to health care concierge regarding OTC card usage.  Plan: send care plan to client; send care plan to primary care. send educational material; send blood pressure monitor to client. Send advanced care packet. RNCM will outreach per tier level in 6 months.    Thea Silversmith, RN, MSN, [place] [phone]

## 2019-12-02 DIAGNOSIS — J309 Allergic rhinitis, unspecified: Secondary | ICD-10-CM | POA: Diagnosis not present

## 2019-12-02 DIAGNOSIS — B351 Tinea unguium: Secondary | ICD-10-CM | POA: Diagnosis not present

## 2019-12-02 DIAGNOSIS — M2041 Other hammer toe(s) (acquired), right foot: Secondary | ICD-10-CM | POA: Diagnosis not present

## 2019-12-02 DIAGNOSIS — J479 Bronchiectasis, uncomplicated: Secondary | ICD-10-CM | POA: Diagnosis not present

## 2019-12-02 DIAGNOSIS — M2042 Other hammer toe(s) (acquired), left foot: Secondary | ICD-10-CM | POA: Diagnosis not present

## 2019-12-02 DIAGNOSIS — E119 Type 2 diabetes mellitus without complications: Secondary | ICD-10-CM | POA: Diagnosis not present

## 2019-12-29 ENCOUNTER — Other Ambulatory Visit: Payer: Self-pay

## 2019-12-30 ENCOUNTER — Telehealth: Payer: Self-pay | Admitting: Nurse Practitioner

## 2019-12-30 ENCOUNTER — Encounter: Payer: Self-pay | Admitting: Nurse Practitioner

## 2019-12-30 ENCOUNTER — Ambulatory Visit (INDEPENDENT_AMBULATORY_CARE_PROVIDER_SITE_OTHER): Payer: HMO | Admitting: Nurse Practitioner

## 2019-12-30 VITALS — BP 124/60 | HR 77 | Temp 97.5°F | Ht 70.08 in | Wt 178.8 lb

## 2019-12-30 DIAGNOSIS — Z794 Long term (current) use of insulin: Secondary | ICD-10-CM

## 2019-12-30 DIAGNOSIS — E1169 Type 2 diabetes mellitus with other specified complication: Secondary | ICD-10-CM | POA: Diagnosis not present

## 2019-12-30 DIAGNOSIS — I10 Essential (primary) hypertension: Secondary | ICD-10-CM

## 2019-12-30 DIAGNOSIS — I6529 Occlusion and stenosis of unspecified carotid artery: Secondary | ICD-10-CM

## 2019-12-30 DIAGNOSIS — E7800 Pure hypercholesterolemia, unspecified: Secondary | ICD-10-CM

## 2019-12-30 DIAGNOSIS — R7989 Other specified abnormal findings of blood chemistry: Secondary | ICD-10-CM | POA: Diagnosis not present

## 2019-12-30 LAB — CBC
HCT: 35.8 % — ABNORMAL LOW (ref 36.0–46.0)
Hemoglobin: 11.4 g/dL — ABNORMAL LOW (ref 12.0–15.0)
MCHC: 31.7 g/dL (ref 30.0–36.0)
MCV: 70.3 fl — ABNORMAL LOW (ref 78.0–100.0)
Platelets: 289 10*3/uL (ref 150.0–400.0)
RBC: 5.1 Mil/uL (ref 3.87–5.11)
RDW: 16.4 % — ABNORMAL HIGH (ref 11.5–15.5)
WBC: 9.1 10*3/uL (ref 4.0–10.5)

## 2019-12-30 LAB — IBC + FERRITIN
Ferritin: 166.7 ng/mL (ref 10.0–291.0)
Iron: 69 ug/dL (ref 42–145)
Saturation Ratios: 22.9 % (ref 20.0–50.0)
Transferrin: 215 mg/dL (ref 212.0–360.0)

## 2019-12-30 LAB — HEPATIC FUNCTION PANEL
ALT: 13 U/L (ref 0–35)
AST: 15 U/L (ref 0–37)
Albumin: 3.9 g/dL (ref 3.5–5.2)
Alkaline Phosphatase: 59 U/L (ref 39–117)
Bilirubin, Direct: 0.1 mg/dL (ref 0.0–0.3)
Total Bilirubin: 0.4 mg/dL (ref 0.2–1.2)
Total Protein: 7.2 g/dL (ref 6.0–8.3)

## 2019-12-30 LAB — LIPID PANEL
Cholesterol: 212 mg/dL — ABNORMAL HIGH (ref 0–200)
HDL: 93.2 mg/dL (ref >39.00)
LDL Cholesterol: 106 mg/dL — ABNORMAL HIGH (ref 0–99)
NonHDL: 119.02
Total CHOL/HDL Ratio: 2
Triglycerides: 64 mg/dL (ref 0.0–149.0)
VLDL: 12.8 mg/dL (ref 0.0–40.0)

## 2019-12-30 LAB — MICROALBUMIN / CREATININE URINE RATIO
Creatinine,U: 51.1 mg/dL
Microalb Creat Ratio: 1.4 mg/g (ref 0.0–30.0)
Microalb, Ur: <0.7 mg/dL (ref 0.0–1.9)

## 2019-12-30 LAB — BASIC METABOLIC PANEL
BUN: 21 mg/dL (ref 6–23)
CO2: 31 mEq/L (ref 19–32)
Calcium: 10.1 mg/dL (ref 8.4–10.5)
Chloride: 100 mEq/L (ref 96–112)
Creatinine, Ser: 0.8 mg/dL (ref 0.40–1.20)
GFR: 69.9 mL/min (ref >60.00)
Glucose, Bld: 174 mg/dL — ABNORMAL HIGH (ref 70–99)
Potassium: 4.4 mEq/L (ref 3.5–5.1)
Sodium: 138 mEq/L (ref 135–145)

## 2019-12-30 LAB — HEMOGLOBIN A1C: Hgb A1c MFr Bld: 7.3 % — ABNORMAL HIGH (ref 4.6–6.5)

## 2019-12-30 LAB — TSH: TSH: 0.78 u[IU]/mL (ref 0.35–4.50)

## 2019-12-30 MED ORDER — ONETOUCH ULTRASOFT LANCETS MISC: 1.0000 | Freq: Two times a day (BID) | 3 refills | Status: DC

## 2019-12-30 MED ORDER — ONETOUCH ULTRA VI STRP: 1.0000 | ORAL_STRIP | Freq: Two times a day (BID) | 3 refills | Status: DC

## 2019-12-30 NOTE — Telephone Encounter (Signed)
Patient called to let Candice Pineda know the brand of BP machine she uses is a One Touch Ultra.

## 2019-12-30 NOTE — Progress Notes (Signed)
Subjective:  Patient ID: Candice Pineda, female    DOB: Jan 28, 1946  Age: 74 y.o. MRN: [id number]  CC: Establish Care (New Pt-CPE-Pt had cereal and coffee at 6AM//eye exam due with Constance Haw Tsamis//ont sure on last colonoscopy or where)  HPI Ms. Hutcheson is here to establish care. Her previous pcp was Dr. Loistine Simas with [facility], last OV 10/2019. She is a widow with 2adult children. She lives alone. She has a hx of DM, HTN Hyperlipidemia, carotid stenosis, bronchiectasis, diabetic retinopathy, cataracts, and neuropathy, Elevated Ferritin She has a pulmonologist and retina specialist within [facility]. She plans to maintain these providers  DM: controlled with Hgb A1c of 7.6 Current use of metformin and humulin She denies any hypoglycemic episode, but does not check glucose at home. LDL at goal Negative urine microalbumin  Reviewed previous lab results and OV noted from previous pcp: Dr. Loistine Simas.  Reviewed past Medical, Social and Family history today.  Outpatient Medications Prior to Visit  Medication Sig Dispense Refill  . acyclovir cream (ZOVIRAX) 5 % Apply to affected area BID prn.    . albuterol (VENTOLIN HFA) 108 (90 Base) MCG/ACT inhaler INHALE 2 PUFFS EVERY 4 HOURS AS NEEDED WHEEZING AND FOR SHORTNESS OF BREATH    . aspirin EC 81 MG tablet Take 81 mg by mouth daily.    . Blood Glucose Calibration (ACCU-CHEK AVIVA) SOLN USE AS DIRECTED    . Blood Glucose Monitoring Suppl (ACCU-CHEK AVIVA) device Use daily to check blood sugar daily    . budesonide-formoterol (SYMBICORT) 80-4.5 MCG/ACT inhaler Inhale 2 puffs into the lungs 2 (two) times daily.    . calcium-vitamin D (OSCAL WITH D) 500-200 MG-UNIT tablet Take 1 tablet by mouth 2 (two) times daily.    . cetirizine (ZYRTEC) 10 MG tablet Take 10 mg by mouth daily.    Mariane Baumgarten Sodium (DSS) 100 MG CAPS Take by mouth.    . ferrous sulfate 325 (65 FE) MG tablet Take 325 mg by mouth daily with breakfast.    . fluticasone (FLONASE) 50 MCG/ACT nasal  spray Place 1 spray into both nostrils daily as needed for allergies or rhinitis.    . folic acid (FOLVITE) A999333 MCG tablet Take 400 mcg by mouth daily.    . hydrochlorothiazide (HYDRODIURIL) 25 MG tablet Take 25 mg by mouth daily.    . insulin NPH-regular Human (70-30) 100 UNIT/ML injection Inject 15 Units into the skin 2 (two) times daily with a meal.    . Lidocaine-Hydrocortisone Ace 3-0.5 % CREA Apply twice a day.    Marland Kitchen LORazepam (ATIVAN) 0.5 MG tablet Take 1/2 - 1 tab po qhs prn anxiety or insomnia    . losartan (COZAAR) 50 MG tablet Take 50 mg by mouth daily.    . metFORMIN (GLUCOPHAGE) 500 MG tablet Take 500 mg by mouth 2 (two) times daily with a meal.    . Multiple Vitamin (MULTI-VITAMIN DAILY PO) Take 1 tablet by mouth daily.    Marland Kitchen omeprazole (PRILOSEC) 40 MG capsule Take 40 mg by mouth daily.    . potassium chloride SA (K-DUR,KLOR-CON) 20 MEQ tablet Take 20 mEq by mouth 2 (two) times daily.    . simvastatin (ZOCOR) 40 MG tablet Take 40 mg by mouth daily.    Marland Kitchen triamcinolone ointment (KENALOG) 0.5 % Apply 3-4 times as needed.  Do not use on face.    . vitamin C (ASCORBIC ACID) 500 MG tablet Take 500 mg by mouth daily.    . Accu-Chek FastClix Lancets  MISC Frequency:   Dosage:0     Instructions:  Note:USE TO CHECK BLOOD SUGAR EVERY MORNING FASTING AND 2 HOURS AFTER DINNER    . omeprazole (PRILOSEC) 40 MG capsule Take by mouth.    Glory Rosebush ULTRA test strip CHECK BLOOD SUGAR TWICE DAILY    . potassium chloride SA (KLOR-CON) 20 MEQ tablet TAKE 1 TABLET TWICE DAILY     No facility-administered medications prior to visit.    ROS See HPI  Objective:  BP 124/60   Pulse 77   Temp (!) 97.5 F (36.4 C) (Tympanic)   Ht 5' 10.08" (1.78 m)   Wt 178 lb 12.8 oz (81.1 kg)   SpO2 97%   BMI 25.60 kg/m   BP Readings from Last 3 Encounters:  12/30/19 124/60    Wt Readings from Last 3 Encounters:  12/30/19 178 lb 12.8 oz (81.1 kg)    Physical Exam Constitutional:      Appearance: She is  obese.  Cardiovascular:     Rate and Rhythm: Normal rate and regular rhythm.     Pulses: Normal pulses.     Heart sounds: Normal heart sounds.  Pulmonary:     Effort: Pulmonary effort is normal.     Breath sounds: Normal breath sounds.  Abdominal:     General: Bowel sounds are normal.     Palpations: Abdomen is soft.  Musculoskeletal:     Right lower leg: Edema present.     Left lower leg: Edema present.  Skin:    General: Skin is warm and dry.     Comments: Abnormal foot exam  Neurological:     Mental Status: She is alert and oriented to person, place, and time.  Psychiatric:        Mood and Affect: Mood normal.        Behavior: Behavior normal.     Lab Results  Component Value Date   WBC 9.1 12/30/2019   HGB 11.4 (L) 12/30/2019   HCT 35.8 (L) 12/30/2019   PLT 289.0 12/30/2019   GLUCOSE 174 (H) 12/30/2019   CHOL 212 (H) 12/30/2019   TRIG 64.0 12/30/2019   HDL 93.20 12/30/2019   LDLCALC 106 (H) 12/30/2019   ALT 13 12/30/2019   AST 15 12/30/2019   NA 138 12/30/2019   K 4.4 12/30/2019   CL 100 12/30/2019   CREATININE 0.80 12/30/2019   BUN 21 12/30/2019   CO2 31 12/30/2019   TSH 0.78 12/30/2019   HGBA1C 7.3 (H) 12/30/2019   MICROALBUR <0.7 12/30/2019    Assessment & Plan:  This visit occurred during the SARS-CoV-2 public health emergency.  Safety protocols were in place, including screening questions prior to the visit, additional usage of staff PPE, and extensive cleaning of exam room while observing appropriate contact time as indicated for disinfecting solutions.   Misbah was seen today for establish care.  Diagnoses and all orders for this visit:  Essential hypertension -     Basic metabolic panel -     TSH -     CBC  Type 2 diabetes mellitus with other specified complication, with long-term current use of insulin (HCC) -     Microalbumin / creatinine urine ratio -     Hemoglobin A1c -     Basic metabolic panel -     Lancets (ONETOUCH ULTRASOFT)  lancets; 1 each by Other route in the morning and at bedtime. Use as instructed -     ONETOUCH ULTRA test strip; 1 each  by Other route in the morning and at bedtime. Use as instructed  Hypercholesterolemia -     Hepatic function panel -     Lipid panel  Asymptomatic carotid artery stenosis, unspecified laterality -     Hepatic function panel -     Lipid panel  Elevated ferritin level -     IBC + Ferritin  Elevated ferritin   I have discontinued Davisha Bauer's Accu-Chek FastClix Lancets. I have also changed her OneTouch Ultra. Additionally, I am having her start on onetouch ultrasoft. Lastly, I am having her maintain her vitamin C, calcium-vitamin D, ferrous sulfate, folic acid, fluticasone, aspirin EC, hydrochlorothiazide, losartan, potassium chloride SA, simvastatin, omeprazole, insulin NPH-regular Human, metFORMIN, budesonide-formoterol, Accu-Chek Aviva, albuterol, LORazepam, Multiple Vitamin (MULTI-VITAMIN DAILY PO), acyclovir cream, Accu-Chek Aviva, DSS, Lidocaine-Hydrocortisone Ace, triamcinolone ointment, and cetirizine.  Meds ordered this encounter  Medications  . Lancets (ONETOUCH ULTRASOFT) lancets    Sig: 1 each by Other route in the morning and at bedtime. Use as instructed    Dispense:  200 each    Refill:  3    Order Specific Question:   Supervising Provider    Answer:   Ronnald Nian [id number]  . ONETOUCH ULTRA test strip    Sig: 1 each by Other route in the morning and at bedtime. 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 Denies any hypoglycemic episodes Positive DM retinopathy and cataracts, has retina specialist within [facility]. LDL at goal Negative urine microalbumin  Advised to start glucose monitoring AM and PM F/up in 87month      Relevant Medications   Lancets (ONETOUCH ULTRASOFT) lancets   ONETOUCH ULTRA test strip     Other   Elevated ferritin    resolved      Hypercholesterolemia    Positive carotid stenosis, HTN, and DM LDL at 106 Current use of simvastatin.      Relevant Orders   Hepatic function panel (Completed)   Lipid panel (Completed)    Other Visit Diagnoses    Elevated ferritin level       Relevant Orders   IBC + Ferritin (Completed)       Follow-up: Return in about 3 months (around 03/30/2020) for DM and HTN, hyperlipidemia (F2F, 40mins).  [name], NP

## 2019-12-30 NOTE — Telephone Encounter (Signed)
Lancet and strip sent

## 2019-12-30 NOTE — Patient Instructions (Addendum)
Normal urine microalbuminuria, TSH, iron panel, renal and liver function. CBC indicates anemia HgbA1c of 7.3, DM is controlled. F/up in 72month  Call office with glucometer name Will send strips and lancets then It is important to check glucose before breakfast and before supper.  Hypoglycemia Hypoglycemia is when the sugar (glucose) level in your blood is too low. Signs of low blood sugar may include:  Feeling: ? Hungry. ? Worried or nervous (anxious). ? Sweaty and clammy. ? Confused. ? Dizzy. ? Sleepy. ? Sick to your stomach (nauseous).  Having: ? A fast heartbeat. ? A headache. ? A change in your vision. ? Tingling or no feeling (numbness) around your mouth, lips, or tongue. ? Jerky movements that you cannot control (seizure).  Having trouble with: ? Moving (coordination). ? Sleeping. ? Passing out (fainting). ? Getting upset easily (irritability). Low blood sugar can happen to people who have diabetes and people who do not have diabetes. Low blood sugar can happen quickly, and it can be an emergency. Treating low blood sugar Low blood sugar is often treated by eating or drinking something sugary right away, such as:  Fruit juice, 4-6 oz (120-150 mL).  Regular soda (not diet soda), 4-6 oz (120-150 mL).  Low-fat milk, 4 oz (120 mL).  Several pieces of hard candy.  Sugar or honey, 1 Tbsp (15 mL). Treating low blood sugar if you have diabetes If you can think clearly and swallow safely, follow the 15:15 rule:  Take 15 grams of a fast-acting carb (carbohydrate). Talk with your doctor about how much you should take.  Always keep a source of fast-acting carb with you, such as: ? Sugar tablets (glucose pills). Take 3-4 pills. ? 6-8 pieces of hard candy. ? 4-6 oz (120-150 mL) of fruit juice. ? 4-6 oz (120-150 mL) of regular (not diet) soda. ? 1 Tbsp (15 mL) honey or sugar.  Check your blood sugar 15 minutes after you take the carb.  If your blood sugar is still  at or below 70 mg/dL (3.9 mmol/L), take 15 grams of a carb again.  If your blood sugar does not go above 70 mg/dL (3.9 mmol/L) after 3 tries, get help right away.  After your blood sugar goes back to normal, eat a meal or a snack within 1 hour.  Treating very low blood sugar If your blood sugar is at or below 54 mg/dL (3 mmol/L), you have very low blood sugar (severe hypoglycemia). This may also cause:  Passing out.  Jerky movements you cannot control (seizure).  Losing consciousness (coma). This is an emergency. Do not wait to see if the symptoms will go away. Get medical help right away. Call your local emergency services (911 in the [place] Do not drive yourself to the hospital. If you have very low blood sugar and you cannot eat or drink, you may need a glucagon shot (injection). A family member or friend should learn how to check your blood sugar and how to give you a glucagon shot. Ask your doctor if you need to have a glucagon shot kit at home. Follow these instructions at home: General instructions  Take over-the-counter and prescription medicines only as told by your doctor.  Stay aware of your blood sugar as told by your doctor.  Limit alcohol intake to no more than 1 drink a day for nonpregnant women and 2 drinks a day for men. One drink equals 12 oz of beer (355 mL), 5 oz of wine (148 mL), or 1  oz of hard liquor (44 mL).  Keep all follow-up visits as told by your doctor. This is important. If you have diabetes:   Follow your diabetes care plan as told by your doctor. Make sure you: ? Know the signs of low blood sugar. ? Take your medicines as told. ? Follow your exercise and meal plan. ? Eat on time. Do not skip meals. ? Check your blood sugar as often as told by your doctor. Always check it before and after exercise. ? Follow your sick day plan when you cannot eat or drink normally. Make this plan ahead of time with your doctor.  Share your diabetes care plan  with: ? Your work or school. ? People you live with.  Check your pee (urine) for ketones: ? When you are sick. ? As told by your doctor.  Carry a card or wear jewelry that says you have diabetes. Contact a doctor if:  You have trouble keeping your blood sugar in your target range.  You have low blood sugar often. Get help right away if:  You still have symptoms after you eat or drink something sugary.  Your blood sugar is at or below 54 mg/dL (3 mmol/L).  You have jerky movements that you cannot control.  You pass out. These symptoms may be an emergency. Do not wait to see if the symptoms will go away. Get medical help right away. Call your local emergency services (911 in the [place] Do not drive yourself to the hospital. Summary  Hypoglycemia happens when the level of sugar (glucose) in your blood is too low.  Low blood sugar can happen to people who have diabetes and people who do not have diabetes. Low blood sugar can happen quickly, and it can be an emergency.  Make sure you know the signs of low blood sugar and know how to treat it.  Always keep a source of sugar (fast-acting carb) with you to treat low blood sugar. This information is not intended to replace advice given to you by your health care provider. Make sure you discuss any questions you have with your health care provider. Document Revised: 12/26/2018 Document Reviewed: 10/08/2015 Elsevier Patient Education  2020 [employer].

## 2020-01-01 NOTE — Telephone Encounter (Signed)
Pt notified to pick up lancets and strips from Pharmacy.

## 2020-01-03 NOTE — Assessment & Plan Note (Addendum)
Controlled with HgbA1c at 7.3 Current use of Humulin and merformin. Denies any hypoglycemic episodes Positive DM retinopathy and cataracts, has retina specialist within [facility]. LDL at goal Negative urine microalbumin  Advised to start glucose monitoring AM and PM F/up in 8month

## 2020-01-03 NOTE — Assessment & Plan Note (Signed)
BP at goal with losartan

## 2020-01-03 NOTE — Assessment & Plan Note (Signed)
resolved 

## 2020-01-03 NOTE — Assessment & Plan Note (Signed)
Positive carotid stenosis, HTN, and DM LDL at 106 Current use of simvastatin.

## 2020-01-30 DIAGNOSIS — R062 Wheezing: Secondary | ICD-10-CM | POA: Diagnosis not present

## 2020-01-30 DIAGNOSIS — J309 Allergic rhinitis, unspecified: Secondary | ICD-10-CM | POA: Diagnosis not present

## 2020-01-30 DIAGNOSIS — J479 Bronchiectasis, uncomplicated: Secondary | ICD-10-CM | POA: Diagnosis not present

## 2020-02-03 ENCOUNTER — Telehealth: Payer: Self-pay | Admitting: Nurse Practitioner

## 2020-02-03 NOTE — Telephone Encounter (Signed)
Baldo Ash I just received these and put them on your desk for your initials.

## 2020-02-03 NOTE — Telephone Encounter (Signed)
Erline Levine is calling and stated that she sent over a clearance for aspirin and haven't received it back yet. Fax number [phone]. CB is [phone]

## 2020-02-04 NOTE — Telephone Encounter (Signed)
Form signed.

## 2020-02-04 NOTE — Telephone Encounter (Signed)
Form signed and faxed

## 2020-02-23 ENCOUNTER — Other Ambulatory Visit: Payer: Self-pay

## 2020-02-23 NOTE — Patient Outreach (Signed)
  [facility]) Care Management Chronic Special Needs Program    02/23/2020  Name: Candice Pineda, DOB: 02-15-1946  MRN: [id number]   [name] is enrolled in a chronic special needs plan.  Care Coordination: RNCM received referral from [facility]. RNCM called to clarify referral and spoke with [place]. [name] states client called and request transportation to doctor appointment on 03/03/2020 in [place].    Plan: send referral to social work.  Thea Silversmith, RN, MSN, [place] [phone]

## 2020-02-24 ENCOUNTER — Other Ambulatory Visit: Payer: Self-pay | Admitting: *Deleted

## 2020-02-24 NOTE — Patient Outreach (Signed)
[facility] [facility]) Care Management  02/24/2020  Candice Pineda Sep 02, 1946 [id number]   CSW received high alert referral for pt on yesterday to assist with transportation needs.  CSW contacted pt and confirmed pt idenitity.  CSW introduced self, role and reason for call. Per pt, she can only drive "to the grocery store nearby" and has a daughter recovering from a stroke and unable.  Pt reports having 2 appointments back to back in [place] on 03/03/2020.  CSW has completed TTS transportation request form and await word from them with pt's confirmed plans. Pt appreciative.  Pt also mentioned needing some help with "a nuisance" outside her home.  "There is a tree overgrown and theres a lot of snakes.Marland KitchenMarland KitchenMarland KitchenMarland Kitchen want to get somebody to take care of it".  CSW acknowledged pt's concern and fears related to her safety.  CSW made suggestions to pt to call the city, landlord and/or Legal Aide to seek support/guidance.    CSW will plan to outreach pt again to confirm transportation has been arranged prior to 03/03/2020.  Eduard Clos, MSW, [place] Worker  [place] [phone]

## 2020-02-25 DIAGNOSIS — H52203 Unspecified astigmatism, bilateral: Secondary | ICD-10-CM | POA: Diagnosis not present

## 2020-02-25 DIAGNOSIS — H5201 Hypermetropia, right eye: Secondary | ICD-10-CM | POA: Diagnosis not present

## 2020-02-25 DIAGNOSIS — H35033 Hypertensive retinopathy, bilateral: Secondary | ICD-10-CM | POA: Diagnosis not present

## 2020-02-25 DIAGNOSIS — H524 Presbyopia: Secondary | ICD-10-CM | POA: Diagnosis not present

## 2020-02-25 DIAGNOSIS — H2513 Age-related nuclear cataract, bilateral: Secondary | ICD-10-CM | POA: Diagnosis not present

## 2020-02-25 DIAGNOSIS — E113292 Type 2 diabetes mellitus with mild nonproliferative diabetic retinopathy without macular edema, left eye: Secondary | ICD-10-CM | POA: Diagnosis not present

## 2020-02-25 DIAGNOSIS — H0100A Unspecified blepharitis right eye, upper and lower eyelids: Secondary | ICD-10-CM | POA: Diagnosis not present

## 2020-02-25 DIAGNOSIS — Z794 Long term (current) use of insulin: Secondary | ICD-10-CM | POA: Diagnosis not present

## 2020-03-01 ENCOUNTER — Other Ambulatory Visit: Payer: Self-pay

## 2020-03-01 NOTE — Patient Outreach (Signed)
  [facility] [facility]) Care Management Chronic Special Needs Program    03/01/2020  Name: Candice Pineda, DOB: 03/09/1946  MRN: [id number]   [name] is enrolled in a chronic special needs plan.  Coordination: Spoke with [facility] social worker-confirmed transportation arranged to provider visit.  Plan: outreach as previously scheduled.  Thea Silversmith, RN, MSN, [place] [phone]

## 2020-03-02 ENCOUNTER — Other Ambulatory Visit: Payer: Self-pay | Admitting: *Deleted

## 2020-03-02 NOTE — Patient Outreach (Signed)
[facility]) Care Management  03/02/2020  Candice Pineda 1945/11/28 [id number]   CSW received call this morning from pt inquiring about her transportation arrangements for tomorrow.  CSW communicated with the TTS transportation rep and have confirmed plans for her pickup tomorrow to go to 2 appointments in [facility] and to return home.  CSW called pt back to confirm her understanding of this- she concurs and is reminded to call CSW if problems arise or if other rides are needed prior to follow up call.   Eduard Clos, MSW, [place] Worker  [place] [phone]

## 2020-03-03 DIAGNOSIS — Z8509 Personal history of malignant neoplasm of other digestive organs: Secondary | ICD-10-CM | POA: Diagnosis not present

## 2020-03-03 DIAGNOSIS — Z08 Encounter for follow-up examination after completed treatment for malignant neoplasm: Secondary | ICD-10-CM | POA: Diagnosis not present

## 2020-03-03 DIAGNOSIS — C49A2 Gastrointestinal stromal tumor of stomach: Secondary | ICD-10-CM | POA: Diagnosis not present

## 2020-03-03 DIAGNOSIS — C49A0 Gastrointestinal stromal tumor, unspecified site: Secondary | ICD-10-CM | POA: Diagnosis not present

## 2020-03-12 ENCOUNTER — Other Ambulatory Visit: Payer: Self-pay | Admitting: *Deleted

## 2020-03-12 NOTE — Patient Outreach (Signed)
[facility]) Care Management  03/12/2020  Candice Pineda 1945/11/08 [id number]   CSW spoke with pt who reports her transportation arrangements went great.  CSW inquired about upcoming needs and provided pt with the direct number for pt to call  [facility] to request.([phone])  Per pt, she has a need for a ride in mid July. CSW instructed pt to call a week in advance or more and to call CSW if any issues arise. CSW will sign off at this time and advise PCP and [facility] team of above plans.    Eduard Clos, MSW, [place] Worker  [place] [phone]

## 2020-03-25 ENCOUNTER — Ambulatory Visit (INDEPENDENT_AMBULATORY_CARE_PROVIDER_SITE_OTHER): Payer: HMO

## 2020-03-25 VITALS — Ht 70.0 in | Wt 178.0 lb

## 2020-03-25 DIAGNOSIS — Z0000 Encounter for general adult medical examination without abnormal findings: Secondary | ICD-10-CM

## 2020-03-25 NOTE — Patient Instructions (Addendum)
[name] , Thank you for taking time to come for your Medicare Wellness Visit. I appreciate your ongoing commitment to your health goals. Please review the following plan we discussed and let me know if I can assist you in the future.   Screening recommendations/referrals: Colonoscopy: Completed 03/11/2015 per our discussion.  Mammogram: Completed 06/2019 per our discussion Bone Density: Unsure of last date. To be completed with next mammogram Recommended yearly ophthalmology/optometry visit for glaucoma screening and checkup Recommended yearly dental visit for hygiene and checkup  Vaccinations: Influenza vaccine: Up to date-Due 05/2020 Pneumococcal vaccine: Completed vaccines Tdap vaccine: Allergic Shingles vaccine: Discuss with pharmacy   Covid-19:Completed vaccines  Advanced directives: Discussed. Information mailed  Conditions/risks identified: See problem list  Next appointment: Follow up in one year for your annual wellness visit 03/31/2021 @ 9am  Preventive Care 65 Years and Older, Female Preventive care refers to lifestyle choices and visits with your health care provider that can promote health and wellness. What does preventive care include?  A yearly physical exam. This is also called an annual well check.  Dental exams once or twice a year.  Routine eye exams. Ask your health care provider how often you should have your eyes checked.  Personal lifestyle choices, including:  Daily care of your teeth and gums.  Regular physical activity.  Eating a healthy diet.  Avoiding tobacco and drug use.  Limiting alcohol use.  Practicing safe sex.  Taking low-dose aspirin every day.  Taking vitamin and mineral supplements as recommended by your health care provider. What happens during an annual well check? The services and screenings done by your health care provider during your annual well check will depend on your age, overall health, lifestyle risk factors, and  family history of disease. Counseling  Your health care provider may ask you questions about your:  Alcohol use.  Tobacco use.  Drug use.  Emotional well-being.  Home and relationship well-being.  Sexual activity.  Eating habits.  History of falls.  Memory and ability to understand (cognition).  Work and work Statistician.  Reproductive health. Screening  You may have the following tests or measurements:  Height, weight, and BMI.  Blood pressure.  Lipid and cholesterol levels. These may be checked every 5 years, or more frequently if you are over 52 years old.  Skin check.  Lung cancer screening. You may have this screening every year starting at age 43 if you have a 30-pack-year history of smoking and currently smoke or have quit within the past 15 years.  Fecal occult blood test (FOBT) of the stool. You may have this test every year starting at age 17.  Flexible sigmoidoscopy or colonoscopy. You may have a sigmoidoscopy every 5 years or a colonoscopy every 10 years starting at age 83.  Hepatitis C blood test.  Hepatitis B blood test.  Sexually transmitted disease (STD) testing.  Diabetes screening. This is done by checking your blood sugar (glucose) after you have not eaten for a while (fasting). You may have this done every 1-3 years.  Bone density scan. This is done to screen for osteoporosis. You may have this done starting at age 54.  Mammogram. This may be done every 1-2 years. Talk to your health care provider about how often you should have regular mammograms. Talk with your health care provider about your test results, treatment options, and if necessary, the need for more tests. Vaccines  Your health care provider may recommend certain vaccines, such as:  Influenza  vaccine. This is recommended every year.    Zoster vaccine. You may need this after age 65.  Pneumococcal 13-valent conjugate (PCV13) vaccine. One dose is recommended after age  53.  Pneumococcal polysaccharide (PPSV23) vaccine. One dose is recommended after age 9. Talk to your health care provider about which screenings and vaccines you need and how often you need them. This information is not intended to replace advice given to you by your health care provider. Make sure you discuss any questions you have with your health care provider. Document Released: 10/01/2015 Document Revised: 05/24/2016 Document Reviewed: 07/06/2015 Elsevier Interactive Patient Education  2017 [place] Prevention in the Home Falls can cause injuries. They can happen to people of all ages. There are many things you can do to make your home safe and to help prevent falls. What can I do on the outside of my home?  Regularly fix the edges of walkways and driveways and fix any cracks.  Remove anything that might make you trip as you walk through a door, such as a raised step or threshold.  Trim any bushes or trees on the path to your home.  Use bright outdoor lighting.  Clear any walking paths of anything that might make someone trip, such as rocks or tools.  Regularly check to see if handrails are loose or broken. Make sure that both sides of any steps have handrails.  Any raised decks and porches should have guardrails on the edges.  Have any leaves, snow, or ice cleared regularly.  Use sand or salt on walking paths during winter.  Clean up any spills in your garage right away. This includes oil or grease spills. What can I do in the bathroom?  Use night lights.  Install grab bars by the toilet and in the tub and shower. Do not use towel bars as grab bars.  Use non-skid mats or decals in the tub or shower.  If you need to sit down in the shower, use a plastic, non-slip stool.  Keep the floor dry. Clean up any water that spills on the floor as soon as it happens.  Remove soap buildup in the tub or shower regularly.  Attach bath mats securely with double-sided  non-slip rug tape.  Do not have throw rugs and other things on the floor that can make you trip. What can I do in the bedroom?  Use night lights.  Make sure that you have a light by your bed that is easy to reach.  Do not use any sheets or blankets that are too big for your bed. They should not hang down onto the floor.  Have a firm chair that has side arms. You can use this for support while you get dressed.  Do not have throw rugs and other things on the floor that can make you trip. What can I do in the kitchen?  Clean up any spills right away.  Avoid walking on wet floors.  Keep items that you use a lot in easy-to-reach places.  If you need to reach something above you, use a strong step stool that has a grab bar.  Keep electrical cords out of the way.  Do not use floor polish or wax that makes floors slippery. If you must use wax, use non-skid floor wax.  Do not have throw rugs and other things on the floor that can make you trip. What can I do with my stairs?  Do not leave any items  on the stairs.  Make sure that there are handrails on both sides of the stairs and use them. Fix handrails that are broken or loose. Make sure that handrails are as long as the stairways.  Check any carpeting to make sure that it is firmly attached to the stairs. Fix any carpet that is loose or worn.  Avoid having throw rugs at the top or bottom of the stairs. If you do have throw rugs, attach them to the floor with carpet tape.  Make sure that you have a light switch at the top of the stairs and the bottom of the stairs. If you do not have them, ask someone to add them for you. What else can I do to help prevent falls?  Wear shoes that:  Do not have high heels.  Have rubber bottoms.  Are comfortable and fit you well.  Are closed at the toe. Do not wear sandals.  If you use a stepladder:  Make sure that it is fully opened. Do not climb a closed stepladder.  Make sure that both  sides of the stepladder are locked into place.  Ask someone to hold it for you, if possible.  Clearly mark and make sure that you can see:  Any grab bars or handrails.  First and last steps.  Where the edge of each step is.  Use tools that help you move around (mobility aids) if they are needed. These include:  Canes.  Walkers.  Scooters.  Crutches.  Turn on the lights when you go into a dark area. Replace any light bulbs as soon as they burn out.  Set up your furniture so you have a clear path. Avoid moving your furniture around.  If any of your floors are uneven, fix them.  If there are any pets around you, be aware of where they are.  Review your medicines with your doctor. Some medicines can make you feel dizzy. This can increase your chance of falling. Ask your doctor what other things that you can do to help prevent falls. This information is not intended to replace advice given to you by your health care provider. Make sure you discuss any questions you have with your health care provider. Document Released: 07/01/2009 Document Revised: 02/10/2016 Document Reviewed: 10/09/2014 Elsevier Interactive Patient Education  2017 [employer].

## 2020-03-25 NOTE — Progress Notes (Signed)
Subjective:   Candice Pineda is a 74 y.o. female who presents for an Initial Medicare Annual Wellness Visit.  I connected with [name] today by telephone and verified that I am speaking with the correct person using two identifiers. Location patient: home Location provider: work Persons participating in the virtual visit: patient, Marine scientist.    I discussed the limitations, risks, security and privacy concerns of performing an evaluation and management service by telephone and the availability of in person appointments. I also discussed with the patient that there may be a patient responsible charge related to this service. The patient expressed understanding and verbally consented to this telephonic visit.    Interactive audio and video telecommunications were attempted between this provider and patient, however failed, due to patient having technical difficulties OR patient did not have access to video capability.  We continued and completed visit with audio only.  Some vital signs may be absent or patient reported.   Time Spent with patient on telephone encounter: 35 minutes  Review of Systems      Cardiac Risk Factors include: advanced age (>47men, >56 women);diabetes mellitus;dyslipidemia;hypertension;sedentary lifestyle     Objective:    Today's Vitals   03/25/20 0900  Weight: 178 lb (80.7 kg)  Height: 5\' 10"  (1.778 m)   Body mass index is 25.54 kg/m.  Advanced Directives 03/25/2020 11/04/2019 10/10/2018  Does Patient Have a Medical Advance Directive? No No No  Would patient like information on creating a medical advance directive? Yes (MAU/Ambulatory/Procedural Areas - Information given) Yes (MAU/Ambulatory/Procedural Areas - Information given) -    Current Medications (verified) Outpatient Encounter Medications as of 03/25/2020  Medication Sig  . albuterol (VENTOLIN HFA) 108 (90 Base) MCG/ACT inhaler INHALE 2 PUFFS EVERY 4 HOURS AS NEEDED WHEEZING AND FOR SHORTNESS OF BREATH  .  aspirin EC 81 MG tablet Take 81 mg by mouth daily.  . Blood Glucose Calibration (ACCU-CHEK AVIVA) SOLN USE AS DIRECTED  . Blood Glucose Monitoring Suppl (ACCU-CHEK AVIVA) device Use daily to check blood sugar daily  . budesonide-formoterol (SYMBICORT) 80-4.5 MCG/ACT inhaler Inhale 2 puffs into the lungs 2 (two) times daily.  . calcium-vitamin D (OSCAL WITH D) 500-200 MG-UNIT tablet Take 1 tablet by mouth 2 (two) times daily.  . ferrous sulfate 325 (65 FE) MG tablet Take 325 mg by mouth daily with breakfast.  . folic acid (FOLVITE) 413 MCG tablet Take 400 mcg by mouth daily.  . hydrochlorothiazide (HYDRODIURIL) 25 MG tablet Take 25 mg by mouth daily.  . insulin NPH-regular Human (70-30) 100 UNIT/ML injection Inject 15 Units into the skin 2 (two) times daily with a meal.  . Lancets (ONETOUCH ULTRASOFT) lancets 1 each by Other route in the morning and at bedtime. Use as instructed  . losartan (COZAAR) 50 MG tablet Take 50 mg by mouth daily.  . metFORMIN (GLUCOPHAGE) 500 MG tablet Take 500 mg by mouth 2 (two) times daily with a meal.  . Multiple Vitamin (MULTI-VITAMIN DAILY PO) Take 1 tablet by mouth daily.  Marland Kitchen omeprazole (PRILOSEC) 40 MG capsule Take 40 mg by mouth daily.  Glory Rosebush ULTRA test strip 1 each by Other route in the morning and at bedtime. Use as instructed  . potassium chloride SA (K-DUR,KLOR-CON) 20 MEQ tablet Take 20 mEq by mouth 2 (two) times daily.  . simvastatin (ZOCOR) 40 MG tablet Take 40 mg by mouth daily.  . vitamin C (ASCORBIC ACID) 500 MG tablet Take 500 mg by mouth daily.  Marland Kitchen acyclovir cream (ZOVIRAX)  5 % Apply to affected area BID prn. (Patient not taking: Reported on 03/25/2020)  . cetirizine (ZYRTEC) 10 MG tablet Take 10 mg by mouth daily. (Patient not taking: Reported on 03/25/2020)  . Docusate Sodium (DSS) 100 MG CAPS Take by mouth. (Patient not taking: Reported on 03/25/2020)  . fluticasone (FLONASE) 50 MCG/ACT nasal spray Place 1 spray into both nostrils daily as needed for  allergies or rhinitis. (Patient not taking: Reported on 03/25/2020)  . Lidocaine-Hydrocortisone Ace 3-0.5 % CREA Apply twice a day. (Patient not taking: Reported on 03/25/2020)  . LORazepam (ATIVAN) 0.5 MG tablet Take 1/2 - 1 tab po qhs prn anxiety or insomnia (Patient not taking: Reported on 03/25/2020)  . triamcinolone ointment (KENALOG) 0.5 % Apply 3-4 times as needed.  Do not use on face. (Patient not taking: Reported on 03/25/2020)   No facility-administered encounter medications on file as of 03/25/2020.    Allergies (verified) Montelukast, Tetanus toxoids, and Alendronate   History: Past Medical History:  Diagnosis Date  . Allergy   . Bronchiectasis ([place])   . Diabetes mellitus without complication ([place])   . Hypertension    Past Surgical History:  Procedure Laterality Date  . ABDOMINAL HYSTERECTOMY     Family History  Problem Relation Age of Onset  . Hypertension Sister   . Hypertension Brother   . Stroke Daughter    Social History   Socioeconomic History  . Marital status: Widowed    Spouse name: Not on file  . Number of children: Not on file  . Years of education: Not on file  . Highest education level: Not on file  Occupational History  . Occupation: Retired  Tobacco Use  . Smoking status: Never Smoker  . Smokeless tobacco: Never Used  Vaping Use  . Vaping Use: Never used  Substance and Sexual Activity  . Alcohol use: Yes    Comment: occasional  . Drug use: Not Currently  . Sexual activity: Not Currently  Other Topics Concern  . Not on file  Social History Narrative  . Not on file   Social Determinants of Health   Financial Resource Strain: Medium Risk  . Difficulty of Paying Living Expenses: Somewhat hard  Food Insecurity: No Food Insecurity  . Worried About Charity fundraiser in the Last Year: Never true  . Ran Out of Food in the Last Year: Never true  Transportation Needs: No Transportation Needs  . Lack of Transportation (Medical): No  . Lack of  Transportation (Non-Medical): No  Physical Activity:   . Days of Exercise per Week:   . Minutes of Exercise per Session:   Stress: No Stress Concern Present  . Feeling of Stress : Not at all  Social Connections: Moderately Integrated  . Frequency of Communication with Friends and Family: Three times a week  . Frequency of Social Gatherings with Friends and Family: More than three times a week  . Attends Religious Services: More than 4 times per year  . Active Member of Clubs or Organizations: Yes  . Attends Archivist Meetings: More than 4 times per year  . Marital Status: Widowed    Tobacco Counseling Counseling given: Not Answered   Clinical Intake:  Pre-visit preparation completed: Yes  Pain : No/denies pain     Nutritional Status: BMI 25 -29 Overweight Nutritional Risks: None Diabetes: Yes CBG done?: Yes (133 fasting per pt) CBG resulted in Enter/ Edit results?: No Did pt. bring in CBG monitor from home?: No (virtual visit)  How often do you need to have someone help you when you read instructions, pamphlets, or other written materials from your doctor or pharmacy?: 1 - Never What is the last grade level you completed in school?: high school graduate  Nutrition Risk Assessment:  Has the patient had any N/V/D within the last 2 months?  No  Does the patient have any non-healing wounds?  No  Has the patient had any unintentional weight loss or weight gain?  No   Diabetes:  Is the patient diabetic?  Yes  If diabetic, was a CBG obtained today?  Yes 133 fasting per pt Did the patient bring in their glucometer from home?  No virtual visit How often do you monitor your CBG's? daily.   Financial Strains and Diabetes Management:  Are you having any financial strains with the device, your supplies or your medication? No .  Does the patient want to be seen by Chronic Care Management for management of their diabetes?  No  Would the patient like to be referred to  a Nutritionist or for Diabetic Management?  No   Diabetic Exams:   Diabetic Eye Exam: .02/2020-per patient-waiting on report  Diabetic Foot Exam: Completed 12/30/2019.   Interpreter Needed?: No  Information entered by :: Caroleen Hamman LPN   Activities of Daily Living In your present state of health, do you have any difficulty performing the following activities: 03/25/2020 11/04/2019  Hearing? N N  Vision? N N  Difficulty concentrating or making decisions? N N  Walking or climbing stairs? N N  Dressing or bathing? N N  Doing errands, shopping? N N  Preparing Food and eating ? N N  Using the Toilet? N N  In the past six months, have you accidently leaked urine? N N  Do you have problems with loss of bowel control? N N  Managing your Medications? N N  Managing your Finances? N N  Housekeeping or managing your Housekeeping? N N  Some recent data might be hidden     Immunizations and Health Maintenance Immunization History  Administered Date(s) Administered  . Influenza, High Dose Seasonal PF 05/26/2015  . Influenza,inj,Quad PF,6+ Mos 07/03/2017, 05/23/2019  . PFIZER SARS-COV-2 Vaccination 11/17/2019, 12/15/2019  . Pneumococcal Conjugate-13 01/12/2015, 01/12/2015  . Pneumococcal Polysaccharide-23 08/20/2012, 08/20/2012  . Zoster 09/18/2010, 09/18/2010   Health Maintenance Due  Topic Date Due  . Hepatitis C Screening  Never done  . OPHTHALMOLOGY EXAM  Never done  . DEXA SCAN  Never done    Patient Care Team: [name], [name], NP as PCP - General (Internal Medicine) Luretha Rued, RN as [place] any recent Medical Services you may have received from other than [facility] providers in the past year (date may be approximate).     Assessment:   This is a routine wellness examination for Candice Pineda.  Hearing/Vision screen  Hearing Screening   125Hz  250Hz  500Hz  1000Hz  2000Hz  3000Hz  4000Hz  6000Hz  8000Hz   Right ear:           Left ear:            Comments: No hearing loss  Vision Screening Comments: Last eye exam-02/2020  Dietary issues and exercise activities discussed: Current Exercise Habits: The patient does not participate in regular exercise at present, Exercise limited by: None identified  Goals Addressed            This Visit's Progress   . Patient Stated       Would  like to start exercising and continue eating healthy      Depression Screen PHQ 2/9 Scores 03/25/2020 12/30/2019 11/04/2019 10/10/2018  PHQ - 2 Score 0 0 0 0    Fall Risk Fall Risk  03/25/2020 12/30/2019  Falls in the past year? 0 -  Number falls in past yr: 0 0  Injury with Fall? 0 0  Follow up Falls prevention discussed -    FALL RISK PREVENTION PERTAINING TO THE HOME:  Any stairs in or around the home? No    Home free of loose throw rugs in walkways, pet beds, electrical cords, etc? Yes  Adequate lighting in your home to reduce risk of falls? Yes   ASSISTIVE DEVICES UTILIZED TO PREVENT FALLS:  Life alert? No  Use of a cane, walker or w/c? No  Grab bars in the bathroom? No  Shower chair or bench in shower? No  Elevated toilet seat or a handicapped toilet? No    TIMED UP AND GO:  Was the test performed? No . Virtual visit   Cognitive Function: Patient does word search frequently.     6CIT Screen 03/25/2020  What Year? 0 points  What month? 0 points  What time? 0 points  Count back from 20 0 points  Months in reverse 0 points  Repeat phrase 0 points  Total Score 0    Screening Tests Health Maintenance  Topic Date Due  . Hepatitis C Screening  Never done  . OPHTHALMOLOGY EXAM  Never done  . DEXA SCAN  Never done  . TETANUS/TDAP  03/25/2021 (Originally 11/19/1964)  . INFLUENZA VACCINE  04/18/2020  . HEMOGLOBIN A1C  06/30/2020  . FOOT EXAM  12/29/2020  . COLONOSCOPY  03/10/2025  . COVID-19 Vaccine  Completed  . PNA vac Low Risk Adult  Completed    Qualifies for Shingles Vaccine? Yes  Zostavax completed yes. Due  for Shingrix. Education has been provided regarding the importance of this vaccine. Pt has been advised to call insurance company to determine out of pocket expense. Advised may also receive vaccine at local pharmacy or Health Dept. Verbalized acceptance and understanding.  Tdap: Patient is allergic  Flu Vaccine: Due 05/2020  Pneumococcal Vaccine: Completed vaccines  COVID-19 vaccine:  Vaccines received  Cancer Screenings:  Colorectal Screening: Completed 03/11/2015. Per patient & KPN report.   Mammogram: Completed 06/2019-per patient. Repeat every year;   Bone Density: Patient unsure if or when she has had. She would like to have done when she has her next mammogram in October.  Lung Cancer Screening: (Low Dose CT Chest recommended if Age 3-80 years, 30 pack-year currently smoking OR have quit w/in 15years.) does not qualify.     Additional Screening:  Hepatitis C Screening: does qualify; Discuss with PCP   Dental Screening: Recommended annual dental exams for proper oral hygiene  Community Resource Referral:  CRR required this visit?  Yes   Patient would like information on assistance with utility bill     Plan:  I have personally reviewed and addressed the Medicare Annual Wellness questionnaire and have noted the following in the patient's chart:  A. Medical and social history B. Use of alcohol, tobacco or illicit drugs  C. Current medications and supplements D. Functional ability and status E.  Nutritional status F.  Physical activity G. Advance directives H. List of other physicians I.  Hospitalizations, surgeries, and ER visits in previous 12 months J.  [place] such as hearing and vision if needed, cognitive and  depression L. Referrals and appointments   In addition, I have reviewed and discussed with patient certain preventive protocols, quality metrics, and best practice recommendations. A written personalized care plan for preventive services as  well as general preventive health recommendations were provided to patient.  Due to this being a telephonic visit, the after visit summary with patients personalized plan was offered to patient via mail or my-chart. Per request, patient was mailed a copy of AVS.   Signed,    Marta Antu, LPN   02/23/3418  Nurse Health Advisor    Nurse Notes: None

## 2020-04-06 ENCOUNTER — Ambulatory Visit: Payer: HMO | Admitting: Nurse Practitioner

## 2020-04-12 ENCOUNTER — Other Ambulatory Visit: Payer: Self-pay

## 2020-04-12 ENCOUNTER — Telehealth: Payer: Self-pay | Admitting: Nurse Practitioner

## 2020-04-13 ENCOUNTER — Ambulatory Visit (INDEPENDENT_AMBULATORY_CARE_PROVIDER_SITE_OTHER): Payer: HMO | Admitting: Nurse Practitioner

## 2020-04-13 ENCOUNTER — Encounter: Payer: Self-pay | Admitting: Nurse Practitioner

## 2020-04-13 VITALS — BP 98/60 | HR 77 | Temp 96.5°F | Ht 70.0 in | Wt 183.2 lb

## 2020-04-13 DIAGNOSIS — R350 Frequency of micturition: Secondary | ICD-10-CM | POA: Diagnosis not present

## 2020-04-13 DIAGNOSIS — E7800 Pure hypercholesterolemia, unspecified: Secondary | ICD-10-CM | POA: Diagnosis not present

## 2020-04-13 DIAGNOSIS — R42 Dizziness and giddiness: Secondary | ICD-10-CM | POA: Insufficient documentation

## 2020-04-13 DIAGNOSIS — R700 Elevated erythrocyte sedimentation rate: Secondary | ICD-10-CM | POA: Diagnosis not present

## 2020-04-13 DIAGNOSIS — I952 Hypotension due to drugs: Secondary | ICD-10-CM | POA: Diagnosis not present

## 2020-04-13 DIAGNOSIS — Z794 Long term (current) use of insulin: Secondary | ICD-10-CM | POA: Diagnosis not present

## 2020-04-13 DIAGNOSIS — E162 Hypoglycemia, unspecified: Secondary | ICD-10-CM | POA: Diagnosis not present

## 2020-04-13 DIAGNOSIS — I10 Essential (primary) hypertension: Secondary | ICD-10-CM

## 2020-04-13 DIAGNOSIS — E113292 Type 2 diabetes mellitus with mild nonproliferative diabetic retinopathy without macular edema, left eye: Secondary | ICD-10-CM | POA: Diagnosis not present

## 2020-04-13 DIAGNOSIS — R600 Localized edema: Secondary | ICD-10-CM

## 2020-04-13 LAB — SEDIMENTATION RATE: Sed Rate: 110 mm/hr — ABNORMAL HIGH (ref 0–30)

## 2020-04-13 LAB — BASIC METABOLIC PANEL
BUN: 16 mg/dL (ref 6–23)
CO2: 31 mEq/L (ref 19–32)
Calcium: 10.4 mg/dL (ref 8.4–10.5)
Chloride: 100 mEq/L (ref 96–112)
Creatinine, Ser: 0.76 mg/dL (ref 0.40–1.20)
GFR: 74.11 mL/min (ref >60.00)
Glucose, Bld: 84 mg/dL (ref 70–99)
Potassium: 4.2 mEq/L (ref 3.5–5.1)
Sodium: 138 mEq/L (ref 135–145)

## 2020-04-13 LAB — CBC WITH DIFFERENTIAL/PLATELET
Basophils Relative: 1.4 % (ref 0.0–3.0)
Eosinophils Relative: 0.6 % (ref 0.0–5.0)
HCT: 36.4 % (ref 36.0–46.0)
Hemoglobin: 11.5 g/dL — ABNORMAL LOW (ref 12.0–15.0)
Lymphocytes Relative: 40.8 % (ref 12.0–46.0)
MCHC: 31.6 g/dL (ref 30.0–36.0)
MCV: 70.6 fl — ABNORMAL LOW (ref 78.0–100.0)
Monocytes Relative: 7.3 % (ref 3.0–12.0)
Neutrophils Relative %: 49.9 % (ref 43.0–77.0)
Platelets: 265 10*3/uL (ref 150.0–400.0)
RBC: 5.16 Mil/uL — ABNORMAL HIGH (ref 3.87–5.11)
RDW: 16 % — ABNORMAL HIGH (ref 11.5–15.5)
WBC: 9.5 10*3/uL (ref 4.0–10.5)

## 2020-04-13 LAB — HEMOGLOBIN A1C: Hgb A1c MFr Bld: 7.6 % — ABNORMAL HIGH (ref 4.6–6.5)

## 2020-04-13 LAB — CK: Total CK: 162 U/L (ref 7–177)

## 2020-04-13 MED ORDER — SIMVASTATIN 40 MG PO TABS: 40.0000 mg | ORAL_TABLET | Freq: Every day | ORAL | 3 refills | Status: DC

## 2020-04-13 MED ORDER — LOSARTAN POTASSIUM 25 MG PO TABS: 25.0000 mg | ORAL_TABLET | Freq: Every day | ORAL | 3 refills | Status: DC

## 2020-04-13 NOTE — Patient Instructions (Signed)
You will be contacted to schedule appt for echocardiogram and carotid US.  Go to lab for blood draw  Stop novolin due to hypoglycemia Decrease losartant to 25mg  due to hypotension.  Maintain DASH diet and adequate oral hydration.  Check glucose 3x/week in AM (before breakfast)  F/up in 2weeks (video)

## 2020-04-13 NOTE — Assessment & Plan Note (Signed)
Low BP noted today. Possibly related to dizziness and fatigue? BP Readings from Last 3 Encounters:  04/13/20 (!) 98/60  12/30/19 124/60   Decrease losartan to 25mg  Continue HCTZ and potassium due to chronic LE edema Repeat BMP Advised to maintain adquate oral hydration and DASh diet F/up in 2weeks

## 2020-04-13 NOTE — Assessment & Plan Note (Addendum)
Chronic, intermittent, worsening in last 10month reports syncopal episode in 2020 due to vertigo. Denies any cardiac work up in past.  CT head completed (no acute finding) carotid duplex done (bilateral 21-39% stenosis).  Unsure is related to hypoglycemic or hypotension or anemia? Ordered echocardiogram Repeat BMP, and CBC Check ESR. Made medication changes

## 2020-04-13 NOTE — Assessment & Plan Note (Signed)
Reports AM hypoglycemia:70s, symptomatic Does not check glucose daily due to finger callus and pain. Takes novolin 15units BID and metformin.  D/c novolin Continue metformin Repeat HgbA1c Check glucose 3x/week in AM F/up in 2weeks

## 2020-04-13 NOTE — Progress Notes (Addendum)
Subjective:  Patient ID: Candice Pineda, female    DOB: 01/16/1946  Age: 74 y.o. MRN: [id number]  CC: Hypertension (3 month follow up/DM)  Dizziness This is a recurrent problem. The current episode started more than 1 month ago. The problem occurs intermittently. The problem has been waxing and waning. Associated symptoms include vertigo. Pertinent negatives include no chills, diaphoresis, fatigue, fever, headaches, myalgias, nausea, numbness, urinary symptoms, visual change, vomiting or weakness. The symptoms are aggravated by bending, walking and twisting. She has tried nothing for the symptoms.  Urinary Frequency  This is a new problem. The current episode started 1 to 4 weeks ago. The problem occurs every urination. The problem has been unchanged. The pain is at a severity of 0/10. There is no history of pyelonephritis. Associated symptoms include frequency and urgency. Pertinent negatives include no chills, discharge, flank pain, hematuria, hesitancy, nausea, possible pregnancy, sweats or vomiting. She has tried nothing for the symptoms. There is no history of catheterization, kidney stones, recurrent UTIs or a urological procedure.  reports syncopal episode in 2020 due to vertigo. Denies any cardiac work up in past.  Denies any palpitations or chest pain. CT head completed (no acute finding) carotid duplex done (bilateral 21-39% stenosis).  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 Takes novolin 15units BID and metformin.  D/c novolin Continue metformin Repeat HgbA1c Check glucose 3x/week in AM F/up in 2weeks  Essential hypertension Low BP noted today. Possibly related to dizziness and fatigue? BP Readings from Last 3 Encounters:  04/13/20 (!) 98/60  12/30/19 124/60    Decrease losartan to 16m Continue HCTZ and potassium due to chronic LE edema Repeat BMP Advised to maintain adquate oral hydration and DASh diet F/up in 2weeks  Dizziness Chronic, intermittent, worsening in last 139monthreports syncopal episode in 2020 due to vertigo. Denies any cardiac work up in past.  CT head completed (no acute finding) carotid duplex done (bilateral 21-39% stenosis).  Unsure is related to hypoglycemic or hypotension or anemia? Ordered echocardiogram Repeat BMP, and CBC Check ESR. Made medication changes  Reviewed past Medical, Social and Family history today.  Outpatient Medications Prior to Visit  Medication Sig Dispense Refill  . albuterol (VENTOLIN HFA) 108 (90 Base) MCG/ACT inhaler INHALE 2 PUFFS EVERY 4 HOURS AS NEEDED WHEEZING AND FOR SHORTNESS OF BREATH    . Blood Glucose Calibration (ACCU-CHEK AVIVA) SOLN USE AS DIRECTED    . Blood Glucose Monitoring Suppl (ACCU-CHEK AVIVA) device Use daily to check blood sugar daily    . budesonide-formoterol (SYMBICORT) 80-4.5 MCG/ACT inhaler Inhale 2 puffs into the lungs 2 (two) times daily.    . calcium-vitamin D (OSCAL WITH D) 500-200 MG-UNIT tablet Take 1 tablet by mouth 2 (two) times daily.    . ferrous sulfate 325 (65 FE) MG tablet Take 325 mg by mouth daily with breakfast.    . fluticasone (FLONASE) 50 MCG/ACT nasal spray Place 1 spray into both nostrils daily as needed for allergies or rhinitis.     . folic acid (FOLVITE) 40258CG tablet Take 400 mcg by mouth daily.    . hydrochlorothiazide (HYDRODIURIL) 25 MG tablet Take 25 mg by mouth daily.    . Lancets (ONETOUCH ULTRASOFT) lancets 1 each by Other route in the morning and at bedtime. Use as instructed 200 each 3  .  metFORMIN (GLUCOPHAGE) 500 MG tablet Take 500 mg by mouth 2 (two) times daily with a meal.    . Multiple Vitamin (MULTI-VITAMIN DAILY PO) Take 1 tablet by mouth daily.    Marland Kitchen omeprazole (PRILOSEC) 40 MG capsule Take 40 mg by mouth daily.    Glory Rosebush ULTRA test strip 1 each by Other route in the morning and at bedtime. Use as instructed 200 each 3  . potassium chloride SA (K-DUR,KLOR-CON) 20 MEQ tablet Take 20 mEq by mouth 2 (two) times daily.    . insulin NPH-regular Human (70-30) 100 UNIT/ML injection Inject 15 Units into the skin 2 (two) times daily with a meal.    . losartan (COZAAR) 50 MG tablet Take 50 mg by mouth daily.    . simvastatin (ZOCOR) 40 MG tablet Take 40 mg by mouth daily.    Marland Kitchen aspirin EC 81 MG tablet Take 81 mg by mouth daily.    Marland Kitchen triamcinolone ointment (KENALOG) 0.5 % Apply 3-4 times as needed.  Do not use on face.    Marland Kitchen acyclovir cream (ZOVIRAX) 5 % Apply to affected area BID prn. (Patient not taking: Reported on 03/25/2020)    . cetirizine (ZYRTEC) 10 MG tablet Take 10 mg by mouth daily. (Patient not taking: Reported on 03/25/2020)    . Docusate Sodium (DSS) 100 MG CAPS Take by mouth. (Patient not taking: Reported on 03/25/2020)    . Lidocaine-Hydrocortisone Ace 3-0.5 % CREA Apply twice a day. (Patient not taking: Reported on 03/25/2020)    . LORazepam (ATIVAN) 0.5 MG tablet Take 1/2 - 1 tab po qhs prn anxiety or insomnia (Patient not taking: Reported on 03/25/2020)    . vitamin C (ASCORBIC ACID) 500 MG tablet Take 500 mg by mouth daily. (Patient not taking: Reported on 04/13/2020)     No facility-administered medications prior to visit.    ROS See HPI  Objective:  BP (!) 98/60 (BP Location: Left Arm, Patient Position: Sitting, Cuff Size: Normal)   Pulse 77   Temp (!) 96.5 F (35.8 C) (Temporal)   Ht _0  (1.778 m)   Wt 183 lb 3.2 oz (83.1 kg)   SpO2 99%   BMI 26.29 kg/m   Physical Exam Eyes:     Extraocular Movements: Extraocular movements intact.     Conjunctiva/sclera: Conjunctivae normal.     Pupils: Pupils are equal, round, and reactive to light.  Cardiovascular:     Rate and Rhythm: Normal rate and regular rhythm.     Pulses:          Carotid pulses are 0 on the right side and 0 on the left  side.    Heart sounds: Normal heart sounds.  Pulmonary:     Effort: Pulmonary effort is normal.     Breath sounds: Normal breath sounds.  Musculoskeletal:     Right lower leg: Edema present.     Left lower leg: Edema present.  Skin:    Findings: No erythema.  Neurological:     Mental Status: She is alert and oriented to person, place, and time.     Cranial Nerves: No cranial nerve deficit.  Psychiatric:        Mood and Affect: Mood normal.        Behavior: Behavior normal.        Thought Content: Thought content normal.     Assessment & Plan:  This visit occurred during the SARS-CoV-2 public health emergency.  Safety protocols were in place, including  screening questions prior to the visit, additional usage of staff PPE, and extensive cleaning of exam room while observing appropriate contact time as indicated for disinfecting solutions.   Kingslee was seen today for hypertension.  Diagnoses and all orders for this visit:  Essential hypertension -     losartan (COZAAR) 25 MG tablet; Take 1 tablet (25 mg total) by mouth daily. 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 -     CBC w/Diff  Hypoglycemia  Hypercholesterolemia -     simvastatin (ZOCOR) 40 MG tablet; Take 1 tablet (40 mg total) by mouth daily. -     CK (Creatine Kinase)  Dizziness -     CBC w/Diff -     Sedimentation rate -     Cancel: VAS US CAROTID; Future -     ECHOCARDIOGRAM COMPLETE; Future -     predniSONE (DELTASONE) 20 MG tablet; Take 2 tablets (40 mg total) by mouth daily with breakfast for 5 days. -     Sedimentation rate; Future -     C-reactive protein; Future -     Ambulatory referral to  Neurology  Bilateral leg edema -     ECHOCARDIOGRAM COMPLETE; Future  Urinary frequency -     Urinalysis w microscopic + reflex cultur  Elevated sed rate -     predniSONE (DELTASONE) 20 MG tablet; Take 2 tablets (40 mg total) by mouth daily with breakfast for 5 days. -     Sedimentation rate; Future -     C-reactive protein; Future -     Ambulatory referral to Neurology  Other orders -     REFLEXIVE URINE CULTURE    Problem List Items Addressed This Visit      Cardiovascular and Mediastinum   Essential hypertension - Primary    Low BP noted today. Possibly related to dizziness and fatigue? 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 Takes novolin 15units BID and metformin.  D/c novolin Continue metformin Repeat HgbA1c Check glucose 3x/week in AM F/up in 2weeks      Relevant Medications   losartan (COZAAR) 25 MG tablet   simvastatin (ZOCOR) 40 MG tablet   Other Relevant Orders   Hemoglobin A1c (Completed)     Other   Dizziness    Chronic, intermittent, worsening in last 160monthreports syncopal episode in 2020 due to vertigo. Denies any cardiac work up in past.  CT head completed (no acute finding) carotid  duplex done (bilateral 21-39% stenosis).  Unsure is related to hypoglycemic or hypotension or anemia? Ordered  echocardiogram Repeat BMP, and CBC Check ESR. Made medication changes      Relevant Medications   predniSONE (DELTASONE) 20 MG tablet   Other Relevant Orders   CBC w/Diff (Completed)   Sedimentation rate (Completed)   ECHOCARDIOGRAM COMPLETE   Sedimentation rate   C-reactive protein   Ambulatory referral to Neurology   Hypercholesterolemia   Relevant Medications   losartan (COZAAR) 25 MG tablet   simvastatin (ZOCOR) 40 MG tablet   Other Relevant Orders   CK (Creatine Kinase) (Completed)    Other Visit Diagnoses    Hypotension due to drugs       Relevant Medications   losartan (COZAAR) 25 MG tablet   simvastatin (ZOCOR) 40 MG tablet   Other Relevant Orders   CBC w/Diff (Completed)   Hypoglycemia       Bilateral leg edema       Relevant Orders   ECHOCARDIOGRAM COMPLETE   Urinary frequency       Relevant Orders   Urinalysis w microscopic + reflex cultur (Completed)   Elevated sed rate       Relevant Medications   predniSONE (DELTASONE) 20 MG tablet   Other Relevant Orders   Sedimentation rate   C-reactive protein   Ambulatory referral to Neurology      I have spent 60 mins with this patient regarding history taking, documentation, review of previous labs and radiology from previous pcp with [facility], formulating plan and discussing treatment options with patient.  Follow-up: Return in about 2 weeks (around 04/27/2020) for DM (video, 52mns).  CWilfred Lacy NP

## 2020-04-14 LAB — URINALYSIS W MICROSCOPIC + REFLEX CULTURE
Bacteria, UA: NONE SEEN /HPF
Bilirubin Urine: NEGATIVE
Glucose, UA: NEGATIVE
Hgb urine dipstick: NEGATIVE
Hyaline Cast: NONE SEEN /LPF
Ketones, ur: NEGATIVE
Leukocyte Esterase: NEGATIVE
Nitrites, Initial: NEGATIVE
Protein, ur: NEGATIVE
RBC / HPF: NONE SEEN /HPF (ref 0–2)
Specific Gravity, Urine: 1.011 (ref 1.001–1.03)
Squamous Epithelial / HPF: NONE SEEN /HPF (ref <=5)
WBC, UA: NONE SEEN /HPF (ref 0–5)
pH: <=5 (ref 5.0–8.0)

## 2020-04-14 LAB — NO CULTURE INDICATED

## 2020-04-14 NOTE — Telephone Encounter (Signed)
Error

## 2020-04-15 MED ORDER — PREDNISONE 20 MG PO TABS: 40.0000 mg | ORAL_TABLET | Freq: Every day | ORAL | 0 refills | Status: AC

## 2020-04-15 NOTE — Addendum Note (Signed)
Addended by: Leana Gamer on: 04/15/2020 05:22 PM   Modules accepted: Orders

## 2020-04-16 NOTE — Progress Notes (Signed)
Patient verbally understood lab results appointment scheduled for repeat labs. Per patient she will pick up Rx today and start on them.

## 2020-04-21 ENCOUNTER — Ambulatory Visit: Payer: HMO | Admitting: Neurology

## 2020-04-21 ENCOUNTER — Encounter: Payer: Self-pay | Admitting: Neurology

## 2020-04-21 VITALS — Ht 70.0 in | Wt 179.5 lb

## 2020-04-21 DIAGNOSIS — G4719 Other hypersomnia: Secondary | ICD-10-CM | POA: Diagnosis not present

## 2020-04-21 DIAGNOSIS — I951 Orthostatic hypotension: Secondary | ICD-10-CM

## 2020-04-21 DIAGNOSIS — R42 Dizziness and giddiness: Secondary | ICD-10-CM | POA: Diagnosis not present

## 2020-04-21 DIAGNOSIS — R55 Syncope and collapse: Secondary | ICD-10-CM | POA: Diagnosis not present

## 2020-04-21 MED ORDER — PYRIDOSTIGMINE BROMIDE 60 MG PO TABS
60.0000 mg | ORAL_TABLET | Freq: Three times a day (TID) | ORAL | 5 refills | Status: DC
Start: 2020-04-21 — End: 2020-11-05

## 2020-04-21 NOTE — Progress Notes (Signed)
[facility]  PATIENT: Candice Pineda DOB: Aug 14, 1946  REFERRING DOCTOR OR PCP: [name], NP SOURCE: Patient, notes from primary care, lab reports  _________________________________   HISTORICAL  CHIEF COMPLAINT:  Chief Complaint  Patient presents with  . New Patient (Initial Visit)    RM 12, alone. Internal referral from [name], NP at [facility] for dizziness and increased sed rate. Had more than one syncopal episode in 2020. Dizziness occurs with bending, walking and twisting. Saw PCP on 04/13/20 and she had low BP reading then and BP med adjusted. Pt has not been as dizzy since seeing PCP. She reports being more tired than normal. Has difficulty with breathing. Sees pulmonologist, on symbicort. Has echocardiogram scheduled for 04/26/20.     HISTORY OF PRESENT ILLNESS:  I had the pleasure of seeing patient, Candice Pineda, at [facility] for neurologic consultation regarding her episodes of syncope and lightheadedness.  She is a 74 year old woman with several episodes of syncope and many episodes of presyncope.   When she stands up, she often feels lightheaded and does worse as she begins walking.   All 3 episodes occurred about 1-2 minute after standing up.    Every episode went from lightheadedness to pre-syncope to syncope and witnesses say that she just slumped down.  She quickly regains consciousness but is lightheaded upon standing.  She went to the ED with one episode and her evaluation was normal.  She sometimes has some lightheadedness and never while lying down.   After laying sown, she notes she needs to sit a while before standing.    She has noted a fine tremor at times while holding something but not otherwise.    She denies weakness.   No SOB in general but she feels she breathes better in bed laying on her left side.    She denies numbness.   She has urinary frequency/urgency.     She has essential hypertension and  is on losartan 25 mg.  This was reduced recently.  She has DM and was on insulin but now is back off.   She has an Echocardiogram scheduled next week.     She snores a little bit has not woken up gasping/snorting.    She has some daytime sleepiness  EPWORTH SLEEPINESS SCALE  On a scale of 0 - 3 what is the chance of dozing:  Sitting and Reading:   0 Watching TV:    2 Sitting inactive in a public place: 0 Passenger in car for one hour: 3 Lying down to rest in the afternoon: 3 Sitting and talking to someone: 1 Sitting quietly after lunch:  2 In a car, stopped in traffic:  0  Total (out of 24):  11/24   Mild excessive sleepiness   REVIEW OF SYSTEMS: Constitutional: No fevers, chills, sweats, or change in appetite Eyes: No visual changes, double vision, eye pain Ear, nose and throat: No hearing loss, ear pain, nasal congestion, sore throat Cardiovascular: No chest pain, palpitations Respiratory: No shortness of breath at rest or with exertion.   No wheezes GastrointestinaI: No nausea, vomiting, diarrhea, abdominal pain, fecal incontinence Genitourinary: No dysuria, urinary retention or frequency.  No nocturia. Musculoskeletal: No neck pain, back pain Integumentary: No rash, pruritus, skin lesions Neurological: as above Psychiatric: No depression at this time.  No anxiety Endocrine: No palpitations, diaphoresis, change in appetite, change in weigh or increased thirst Hematologic/Lymphatic: No anemia, purpura, petechiae. Allergic/Immunologic: No itchy/runny eyes, nasal  congestion, recent allergic reactions, rashes  ALLERGIES: Allergies  Allergen Reactions  . Montelukast     Other reaction(s): Other (See Comments) Dizziness Dizziness   . Tetanus Toxoids     Arm extremely painful, arm turned red and swollen  . Alendronate Nausea Only    And loose stools. Patient states that she can and wants to continue this medication at this time. And loose stools. Patient states that  she can and wants to continue this medication at this time.     HOME MEDICATIONS:  Current Outpatient Medications:  .  albuterol (VENTOLIN HFA) 108 (90 Base) MCG/ACT inhaler, INHALE 2 PUFFS EVERY 4 HOURS AS NEEDED WHEEZING AND FOR SHORTNESS OF BREATH, Disp: , Rfl:  .  aspirin EC 81 MG tablet, Take 81 mg by mouth daily., Disp: , Rfl:  .  Blood Glucose Calibration (ACCU-CHEK AVIVA) SOLN, USE AS DIRECTED, Disp: , Rfl:  .  Blood Glucose Monitoring Suppl (ACCU-CHEK AVIVA) device, Use daily to check blood sugar daily, Disp: , Rfl:  .  budesonide-formoterol (SYMBICORT) 80-4.5 MCG/ACT inhaler, Inhale 2 puffs into the lungs 2 (two) times daily., Disp: , Rfl:  .  calcium-vitamin D (OSCAL WITH D) 500-200 MG-UNIT tablet, Take 1 tablet by mouth 2 (two) times daily., Disp: , Rfl:  .  ferrous sulfate 325 (65 FE) MG tablet, Take 325 mg by mouth daily with breakfast., Disp: , Rfl:  .  fluticasone (FLONASE) 50 MCG/ACT nasal spray, Place 1 spray into both nostrils daily as needed for allergies or rhinitis. , Disp: , Rfl:  .  folic acid (FOLVITE) 884 MCG tablet, Take 400 mcg by mouth daily., Disp: , Rfl:  .  hydrochlorothiazide (HYDRODIURIL) 25 MG tablet, Take 25 mg by mouth daily., Disp: , Rfl:  .  Lancets (ONETOUCH ULTRASOFT) lancets, 1 each by Other route in the morning and at bedtime. Use as instructed, Disp: 200 each, Rfl: 3 .  losartan (COZAAR) 25 MG tablet, Take 1 tablet (25 mg total) by mouth daily., Disp: 90 tablet, Rfl: 3 .  metFORMIN (GLUCOPHAGE) 500 MG tablet, Take 500 mg by mouth 2 (two) times daily with a meal., Disp: , Rfl:  .  Multiple Vitamin (MULTI-VITAMIN DAILY PO), Take 1 tablet by mouth daily., Disp: , Rfl:  .  omeprazole (PRILOSEC) 40 MG capsule, Take 40 mg by mouth daily., Disp: , Rfl:  .  ONETOUCH ULTRA test strip, 1 each by Other route in the morning and at bedtime. Use as instructed, Disp: 200 each, Rfl: 3 .  potassium chloride SA (K-DUR,KLOR-CON) 20 MEQ tablet, Take 20 mEq by mouth 2  (two) times daily., Disp: , Rfl:  .  simvastatin (ZOCOR) 40 MG tablet, Take 1 tablet (40 mg total) by mouth daily., Disp: 30 tablet, Rfl: 3 .  triamcinolone ointment (KENALOG) 0.5 %, Apply 3-4 times as needed.  Do not use on face., Disp: , Rfl:   PAST MEDICAL HISTORY: Past Medical History:  Diagnosis Date  . Allergy   . Bronchiectasis ([place])   . Diabetes mellitus without complication ([place])   . Hypertension     PAST SURGICAL HISTORY: Past Surgical History:  Procedure Laterality Date  . ABDOMINAL HYSTERECTOMY    . HAND SURGERY Right    cyst removed  . STOMACH SURGERY      FAMILY HISTORY: Family History  Problem Relation Age of Onset  . Hypertension Sister   . Hypertension Brother   . Stroke Daughter     SOCIAL HISTORY:  Social History  Socioeconomic History  . Marital status: Widowed    Spouse name: Not on file  . Number of children: Not on file  . Years of education: Not on file  . Highest education level: Not on file  Occupational History  . Occupation: Retired  Tobacco Use  . Smoking status: Never Smoker  . Smokeless tobacco: Never Used  Vaping Use  . Vaping Use: Never used  Substance and Sexual Activity  . Alcohol use: Yes    Comment: occasional  . Drug use: Not Currently  . Sexual activity: Not Currently  Other Topics Concern  . Not on file  Social History Narrative   Right handed    Lives alone   Caffeine use: Coffee daily   Social Determinants of Health   Financial Resource Strain: Medium Risk  . Difficulty of Paying Living Expenses: Somewhat hard  Food Insecurity: No Food Insecurity  . Worried About Charity fundraiser in the Last Year: Never true  . Ran Out of Food in the Last Year: Never true  Transportation Needs: No Transportation Needs  . Lack of Transportation (Medical): No  . Lack of Transportation (Non-Medical): No  Physical Activity:   . Days of Exercise per Week:   . Minutes of Exercise per Session:   Stress: No Stress Concern  Present  . Feeling of Stress : Not at all  Social Connections: Moderately Integrated  . Frequency of Communication with Friends and Family: Three times a week  . Frequency of Social Gatherings with Friends and Family: More than three times a week  . Attends Religious Services: More than 4 times per year  . Active Member of Clubs or Organizations: Yes  . Attends Archivist Meetings: More than 4 times per year  . Marital Status: Widowed  Intimate Partner Violence: Not At Risk  . Fear of Current or Ex-Partner: No  . Emotionally Abused: No  . Physically Abused: No  . Sexually Abused: No     PHYSICAL EXAM  Vitals:   04/21/20 0735 04/21/20 0815  SpO2: 98% 98%  Weight: 179 lb 8 oz (81.4 kg)   Height: 5\' 10"  (1.778 m)    Orthostatic VS for the past 72 hrs (Last 3 readings):  Orthostatic BP Patient Position BP Location Cuff Size Orthostatic Pulse  04/21/20 0817 (!) 89/52 Standing Right Arm Normal 96  04/21/20 0815 121/64 Sitting Right Arm Normal 80  04/21/20 0735 127/64 Supine Right Arm Normal 80    Body mass index is 25.76 kg/m.   General: The patient is well-developed and well-nourished and in no acute distress  HEENT:  Head is [place]/AT.  Sclera are anicteric.    Neck: No carotid bruits are noted.  The neck is nontender.  Cardiovascular: The heart has a regular rate and rhythm with a normal S1 and S2. There were no murmurs, gallops or rubs.    Skin: Extremities are without rash or  edema.  Musculoskeletal:  Back is nontender  Neurologic Exam  Mental status: The patient is alert and oriented x 3 at the time of the examination. The patient has apparent normal recent and remote memory, with an apparently normal attention span and concentration ability.   Speech is normal.  Cranial nerves: Extraocular movements are full. Pupils are equal, round, and reactive to light and accomodation.  Visual fields are full.  Facial symmetry is present. There is good facial sensation  to soft touch bilaterally.Facial strength is normal.  Trapezius and sternocleidomastoid strength is normal. No  dysarthria is noted.  The tongue is midline, and the patient has symmetric elevation of the soft palate. No obvious hearing deficits are noted.  Motor:  Muscle bulk is normal.   Tone is normal. Strength is  5 / 5 in all 4 extremities.   Sensory: Sensory testing is intact to pinprick, soft touch and vibration sensation in all 4 extremities, including toes  Coordination: Cerebellar testing reveals good finger-nose-finger and heel-to-shin bilaterally.  Gait and station: Station is normal.   Gait is mildly wide. Tandem is poor.   She has retropulsion. . Romberg is negative.   Reflexes: Deep tendon reflexes are symmetric and normal bilaterally.   Plantar responses are flexor.    DIAGNOSTIC DATA (LABS, IMAGING, TESTING) - I reviewed patient records, labs, notes, testing and imaging myself where available.  Lab Results  Component Value Date   WBC 9.5 04/13/2020   HGB 11.5 (L) 04/13/2020   HCT 36.4 04/13/2020   MCV 70.6 (L) 04/13/2020   PLT 265.0 04/13/2020      Component Value Date/Time   NA 138 04/13/2020 1103   K 4.2 04/13/2020 1103   CL 100 04/13/2020 1103   CO2 31 04/13/2020 1103   GLUCOSE 84 04/13/2020 1103   BUN 16 04/13/2020 1103   CREATININE 0.76 04/13/2020 1103   CALCIUM 10.4 04/13/2020 1103   PROT 7.2 12/30/2019 1207   ALBUMIN 3.9 12/30/2019 1207   AST 15 12/30/2019 1207   ALT 13 12/30/2019 1207   ALKPHOS 59 12/30/2019 1207   BILITOT 0.4 12/30/2019 1207   Lab Results  Component Value Date   CHOL 212 (H) 12/30/2019   HDL 93.20 12/30/2019   LDLCALC 106 (H) 12/30/2019   TRIG 64.0 12/30/2019   CHOLHDL 2 12/30/2019   Lab Results  Component Value Date   HGBA1C 7.6 (H) 04/13/2020   No results found for: VITAMINB12 Lab Results  Component Value Date   TSH 0.78 12/30/2019       ASSESSMENT AND PLAN  Syncope, unspecified syncope type  Orthostatic  hypotension  Lightheaded  Excessive daytime sleepiness  In summary, Ms. Jaclynn Guarneri is a 74 year old woman with several episodes of syncope and daily episodes of presyncope.  Today, on examination she had orthostatic hypotension with more than a 30 point drop in blood pressure and increasing heart rate upon standing.  The etiology of the orthostatic hypotension is unclear.  This could be related to her diabetes if she has a small fiber polyneuropathy though she has no evidence of a significant polyneuropathy on examination.  She does not have any parkinsonian features so Shy-Drager disease is unlikely.  She will be having an echocardiogram to help rule out cardiac disease.  Most likely, the orthostatic hypotension is incidental.  I have placed her on pyridostigmine to titrate up to 60 mg p.o. 3 times daily.  If she does not get a benefit and the echocardiogram does not show evidence of CHF, we could also consider fludrocortisone.  I would be reluctant to use ProAmatine at this age in a patient with history of hypertension as some people will have very high blood pressures on this medication while laying down.  Additionally, she has excessive daytime sleepiness.  She believes that she snores.  We discussed that if the symptoms worsen we will check a sleep study for the possibility of obstructive sleep apnea and treat depending on results.  She will return to see [place] in 4 months or sooner for new or worsening neurologic symptoms.  Brock Mokry A.  Felecia Shelling, MD, [facility] 11/20/1935, 9:02 AM Certified in Neurology, Clinical Neurophysiology, Sleep Medicine and Neuroimaging  [facility] [address], [place], [place] [address] [phone]

## 2020-04-22 ENCOUNTER — Other Ambulatory Visit: Payer: Self-pay

## 2020-04-22 ENCOUNTER — Other Ambulatory Visit (INDEPENDENT_AMBULATORY_CARE_PROVIDER_SITE_OTHER): Payer: HMO

## 2020-04-22 DIAGNOSIS — R42 Dizziness and giddiness: Secondary | ICD-10-CM

## 2020-04-22 DIAGNOSIS — R700 Elevated erythrocyte sedimentation rate: Secondary | ICD-10-CM | POA: Diagnosis not present

## 2020-04-22 LAB — C-REACTIVE PROTEIN: CRP: 1.6 mg/dL (ref 0.5–20.0)

## 2020-04-22 LAB — SEDIMENTATION RATE: Sed Rate: 79 mm/hr — ABNORMAL HIGH (ref 0–30)

## 2020-04-22 NOTE — Patient Outreach (Signed)
[facility]) Care Management Chronic Special Needs Program  04/22/2020  Name: Tashai Catino DOB: May 05, 1946  MRN: [id number]  Ms. Sharian Delia is enrolled in a chronic special needs plan for Diabetes. RNCM called to follow up.   Subjective: client reports she has all her medications and denies any difficulty with medication management. She reports she has been following up with providers as scheduled. Client reports saw neurologist on yesterday. Discussed blood pressure drop when standing. No falls reported. Client reports she is scheduled for an echo tomorrow and a follow up visit scheduled with primary care on 04/27/20. Client states she is checking blood sugar every other day as requested by her primary care and reports she is in the process of medication adjustments by primary care provider. Denies any blood sugars less than 70.  Goals Addressed              This Visit's Progress   .  Client will verbalize knowledge of chronic lung disease as evidenced by no ED visits or Inpatient stays related to chronic lung disease    On track     Continue to attend provider visits to pulmonologist(lung doctor) as scheduled.     .  Client will verbalize knowledge of self management of Hypertension as evidences by BP reading of 140/90 or less; or as defined by provider   On track     Follow up with your doctor as scheduled. Ask your doctor "what is my target blood pressure range". Take your blood pressure monitor to your doctor appointment if you needs assistance with how to use. Monitor your blood pressure periodically and take to your doctor's appointment.  Continue to take your medications as recommended by your provider.     Marland Kitchen  HEMOGLOBIN A1C < 8 (pt-stated)        Per chart note: primary care provider goal <8.   A1C 7.6 on 04/13/2020 Diabetes self management actions:  Glucose monitoring per provider recommendations  Eat Healthy, plan to eat low carbohydrate and low salt meals,  watch portion sizes and avoid sugar sweetened drinks.  Visit provider every 3-6 months as directed  Hbg A1C level every 3-6 months.  Ask your doctor, "what is my Target A1C goal?"  Ask your doctor, "what is my Target blood sugar range?"     .  LIFESTYLE - DECREASE FALLS RISK   On track     Continue to attend provider visits as scheduled. Emmi Education provided, "Orthostatic hypotension". Please review and call if you have any questions. Discussed importance of changing positions slowly. Discussed importance of taking medications as prescribed.     .  COMPLETED: Obtain annual  Lipid Profile, LDL-C        Done 12/30/2019    .  COMPLETED: Obtain Annual Eye (retinal)  Exam         Done 04/13/2020    .  COMPLETED: Obtain Annual Foot Exam        Done 12/02/2019     .  COMPLETED: Obtain annual screen for micro albuminuria (urine) , nephropathy (kidney problems)        Done 12/30/2019    .  COMPLETED: Obtain Hemoglobin A1C at least 2 times per year        A1C done 12/30/19 and 04/13/20    .  COMPLETED: Visit Primary Care Provider or Endocrinologist at least 2 times per year         Primary care provider seen 12/30/19 and 04/13/20.  Per client: Next appointment with primary care provider 04/27/2020       Plan: send updated care plan to primary care. Send Air traffic controller. Send updated careplan to client. Next outreach per tier level within the next 6 months.   Thea Silversmith, RN, MSN, [place] [phone]

## 2020-04-26 ENCOUNTER — Other Ambulatory Visit (HOSPITAL_COMMUNITY): Payer: HMO

## 2020-04-26 ENCOUNTER — Telehealth: Payer: Self-pay | Admitting: Nurse Practitioner

## 2020-04-26 NOTE — Telephone Encounter (Signed)
Maintain upcoming appt

## 2020-04-26 NOTE — Telephone Encounter (Signed)
Tried to call to make aware but no answer or no voicemail

## 2020-04-26 NOTE — Telephone Encounter (Signed)
Pt calling in saying that her ride didn't pick her up this morning to take her to her cardio vascular appt and that she will have to call them and get rescheduled, She was wondering since she has an appt with Candice Pineda tomorrow if she needs to reschedule until after she has the appt with cardio vascular or if Candice Pineda just wants to go ahead and see her tomorrow. Please advise

## 2020-04-26 NOTE — Telephone Encounter (Signed)
Pt was contacted and notified and verbally understood to keep her appointment tomorrow at [facility].

## 2020-04-27 ENCOUNTER — Encounter: Payer: Self-pay | Admitting: Nurse Practitioner

## 2020-04-27 ENCOUNTER — Ambulatory Visit (INDEPENDENT_AMBULATORY_CARE_PROVIDER_SITE_OTHER): Payer: HMO | Admitting: Nurse Practitioner

## 2020-04-27 ENCOUNTER — Ambulatory Visit (HOSPITAL_COMMUNITY): Payer: HMO | Attending: Cardiology

## 2020-04-27 ENCOUNTER — Other Ambulatory Visit: Payer: Self-pay

## 2020-04-27 VITALS — BP 128/70 | HR 73 | Temp 97.6°F | Ht 70.0 in | Wt 179.2 lb

## 2020-04-27 DIAGNOSIS — R42 Dizziness and giddiness: Secondary | ICD-10-CM

## 2020-04-27 DIAGNOSIS — I10 Essential (primary) hypertension: Secondary | ICD-10-CM

## 2020-04-27 DIAGNOSIS — I951 Orthostatic hypotension: Secondary | ICD-10-CM

## 2020-04-27 DIAGNOSIS — Z794 Long term (current) use of insulin: Secondary | ICD-10-CM

## 2020-04-27 DIAGNOSIS — E1169 Type 2 diabetes mellitus with other specified complication: Secondary | ICD-10-CM | POA: Diagnosis not present

## 2020-04-27 DIAGNOSIS — R600 Localized edema: Secondary | ICD-10-CM

## 2020-04-27 DIAGNOSIS — R700 Elevated erythrocyte sedimentation rate: Secondary | ICD-10-CM

## 2020-04-27 LAB — ECHOCARDIOGRAM COMPLETE
Area-P 1/2: 4.31 cm2
Height: 70 in
S' Lateral: 1.75 cm
Weight: 2867.2 oz

## 2020-04-27 LAB — C-REACTIVE PROTEIN: CRP: 1.6 mg/dL (ref 0.5–20.0)

## 2020-04-27 LAB — SEDIMENTATION RATE: Sed Rate: 76 mm/hr — ABNORMAL HIGH (ref 0–30)

## 2020-04-27 NOTE — Assessment & Plan Note (Signed)
Elevated fasting glucose due to recent oral prednisone used: 200-300.  Advised to take 15units of novolin today. Call office with fasting glucose tomorrow.

## 2020-04-27 NOTE — Patient Instructions (Addendum)
Take 15units of novolin today. Call office with glucose reading tomorrow.  Go to lab for blood draw Maintain appt for echocardiogram

## 2020-04-27 NOTE — Progress Notes (Signed)
Subjective:  Patient ID: Candice Pineda, female    DOB: 1946/01/19  Age: 74 y.o. MRN: [id number]  CC: Follow-up (2 week for DM-pt has last readings)  HPI  Diabetes mellitus ([place]) Elevated fasting glucose due to recent oral prednisone used: 200-300.  Advised to take 15units of novolin today. Call office with fasting glucose tomorrow.  Orthostatic hypotension Resolved hypotension and dizziness with use of mestinon prescribed by neurology. Pending echocardiogram   Elevated sed rate Improved sed rate with oral prednisone x 5days 110 to 76 Normal CRP and CK Normal neurology exam Resolved fatigue and dizziness Followed by neurology.   Reviewed past Medical, Social and Family history today.  Outpatient Medications Prior to Visit  Medication Sig Dispense Refill   albuterol (VENTOLIN HFA) 108 (90 Base) MCG/ACT inhaler INHALE 2 PUFFS EVERY 4 HOURS AS NEEDED WHEEZING AND FOR SHORTNESS OF BREATH     aspirin EC 81 MG tablet Take 81 mg by mouth daily.     Blood Glucose Calibration (ACCU-CHEK AVIVA) SOLN USE AS DIRECTED     Blood Glucose Monitoring Suppl (ACCU-CHEK AVIVA) device Use daily to check blood sugar daily     budesonide-formoterol (SYMBICORT) 80-4.5 MCG/ACT inhaler Inhale 2 puffs into the lungs 2 (two) times daily.     calcium-vitamin D (OSCAL WITH D) 500-200 MG-UNIT tablet Take 1 tablet by mouth 2 (two) times daily.     hydrochlorothiazide (HYDRODIURIL) 25 MG tablet Take 25 mg by mouth daily.     Lancets (ONETOUCH ULTRASOFT) lancets 1 each by Other route in the morning and at bedtime. Use as instructed 200 each 3   losartan (COZAAR) 25 MG tablet Take 1 tablet (25 mg total) by mouth daily. 90 tablet 3   metFORMIN (GLUCOPHAGE) 500 MG tablet Take 500 mg by mouth 2 (two) times daily with a meal.     Multiple Vitamin (MULTI-VITAMIN DAILY PO) Take 1 tablet by mouth daily.     omeprazole (PRILOSEC) 40 MG capsule Take 40 mg by mouth daily.     ONETOUCH ULTRA test strip 1 each  by Other route in the morning and at bedtime. Use as instructed 200 each 3   potassium chloride SA (K-DUR,KLOR-CON) 20 MEQ tablet Take 20 mEq by mouth 2 (two) times daily.     pyridostigmine (MESTINON) 60 MG tablet Take 1 tablet (60 mg total) by mouth 3 (three) times daily. 90 tablet 5   simvastatin (ZOCOR) 40 MG tablet Take 1 tablet (40 mg total) by mouth daily. 30 tablet 3   triamcinolone ointment (KENALOG) 0.5 % Apply 3-4 times as needed.  Do not use on face.     ferrous sulfate 325 (65 FE) MG tablet Take 325 mg by mouth daily with breakfast. (Patient not taking: Reported on 04/27/2020)     fluticasone (FLONASE) 50 MCG/ACT nasal spray Place 1 spray into both nostrils daily as needed for allergies or rhinitis.  (Patient not taking: Reported on 7/62/2633)     folic acid (FOLVITE) 354 MCG tablet Take 400 mcg by mouth daily. (Patient not taking: Reported on 04/27/2020)     No facility-administered medications prior to visit.    ROS See HPI  Objective:  BP 128/70    Pulse 73    Temp 97.6 F (36.4 C) (Tympanic)    Ht 5\' 10"  (1.778 m)    Wt 179 lb 3.2 oz (81.3 kg)    SpO2 100%    BMI 25.71 kg/m   Physical Exam Vitals reviewed.  Cardiovascular:  Rate and Rhythm: Normal rate and regular rhythm.     Pulses: Normal pulses.     Heart sounds: Normal heart sounds.  Musculoskeletal:     Right lower leg: Edema present.     Left lower leg: Edema present.  Neurological:     Mental Status: She is alert and oriented to person, place, and time.    Assessment & Plan:  This visit occurred during the SARS-CoV-2 public health emergency.  Safety protocols were in place, including screening questions prior to the visit, additional usage of staff PPE, and extensive cleaning of exam room while observing appropriate contact time as indicated for disinfecting solutions.   Guida was seen today for follow-up.  Diagnoses and all orders for this visit:  Elevated sed rate -     Sedimentation rate -      C-reactive protein  Dizziness -     Sedimentation rate -     C-reactive protein  Type 2 diabetes mellitus with other specified complication, with long-term current use of insulin (HCC)  Orthostatic hypotension   Problem List Items Addressed This Visit      Cardiovascular and Mediastinum   Orthostatic hypotension    Resolved hypotension and dizziness with use of mestinon prescribed by neurology. Pending echocardiogram         Endocrine   Diabetes mellitus ([place])    Elevated fasting glucose due to recent oral prednisone used: 200-300.  Advised to take 15units of novolin today. Call office with fasting glucose tomorrow.        Other   Elevated sed rate - Primary    Improved sed rate with oral prednisone x 5days 110 to 76 Normal CRP and CK Normal neurology exam Resolved fatigue and dizziness Followed by neurology.       Relevant Orders   Sedimentation rate (Completed)   C-reactive protein (Completed)    Other Visit Diagnoses    Dizziness       Relevant Orders   Sedimentation rate (Completed)   C-reactive protein (Completed)      Follow-up: Return in about 4 weeks (around 05/25/2020) for DM and HTN (26mins).  [name], NP

## 2020-04-27 NOTE — Assessment & Plan Note (Signed)
Resolved hypotension and dizziness with use of mestinon prescribed by neurology. Pending echocardiogram

## 2020-04-27 NOTE — Assessment & Plan Note (Addendum)
Improved sed rate with oral prednisone 40mg  x 5days 110 to 76 Normal CRP and CK Normal neurology exam Resolved fatigue and dizziness Followed by neurology.

## 2020-04-28 ENCOUNTER — Telehealth: Payer: Self-pay | Admitting: Nurse Practitioner

## 2020-04-28 NOTE — Telephone Encounter (Signed)
Patient is calling and wanted to let Baldo Ash know that her sugar was 245 when she had takeb this morning. Patient is asking should she check her blood sugars before she eats int he morning, please advise. CB is [phone].

## 2020-05-03 ENCOUNTER — Telehealth: Payer: Self-pay | Admitting: Nurse Practitioner

## 2020-05-03 DIAGNOSIS — E1169 Type 2 diabetes mellitus with other specified complication: Secondary | ICD-10-CM

## 2020-05-03 NOTE — Telephone Encounter (Signed)
Patient informed of results and has other questions as to how she is suppose to be taking her insulin. Patient states during her last visit she was informed to check blood sugars every other day and she was to take 15 units in the morning before eating but patient thought that was just for the day after her appointment on 04/27/20 and she has not took any insulin since. Please advise

## 2020-05-03 NOTE — Telephone Encounter (Signed)
Patient is calling and wanted to see if her results were back from her echocardiogram, please advise. CB is [phone]

## 2020-05-04 ENCOUNTER — Telehealth: Payer: Self-pay | Admitting: Neurology

## 2020-05-04 ENCOUNTER — Ambulatory Visit: Payer: HMO

## 2020-05-04 NOTE — Telephone Encounter (Signed)
Cut down to 1/2 pill of pyridostigmine 3 times a day

## 2020-05-04 NOTE — Telephone Encounter (Signed)
Pt states she is feeling muscle weakness, diarrhea, and stomach cramps being on the pyridostigmine (MESTINON) 60 MG tablet and is needing to discuss with RN or Provider. Please advise.

## 2020-05-04 NOTE — Telephone Encounter (Signed)
Glucose should be checked daily before breakfast. Resume insulin at 5units with breakfast and supper

## 2020-05-04 NOTE — Telephone Encounter (Signed)
Called pt back. Relayed Dr. Garth Bigness recommendation. She verbalized understanding and will try to take mestinon 60mg ; 1/2 tablet po TID. If this does not help relieve sx, she will call back and let us know.

## 2020-05-04 NOTE — Telephone Encounter (Signed)
Call office with glucose reading on Friday

## 2020-05-04 NOTE — Telephone Encounter (Signed)
Called patient back to further discuss. She has been having SE since she started mestinon 60mg  po TID. She is also having muscle cramps which she read could be a potential SE. Denies any signs/symptoms of respiratory infection or other signs of infection.  Does have rash on left buttocks that is tender/denies itching. She is unsure when this started. She will f/u with PCP on this. Advised I will send message to MD and call back with recommendation.  Per note from PCP on 04/28/20, Candice [name]., the echocardiogram results are:  "Normal EF and left ventricle wall thickness. mild impaired relaxation which is typically due to long standing HTN. We need to continue to adequate BP control to prevent any decline in heart function. No cardiology referral needed at this time"

## 2020-05-04 NOTE — Telephone Encounter (Signed)
Patient notified and verbalized understanding. 

## 2020-05-06 DIAGNOSIS — J301 Allergic rhinitis due to pollen: Secondary | ICD-10-CM | POA: Diagnosis not present

## 2020-05-06 DIAGNOSIS — R05 Cough: Secondary | ICD-10-CM | POA: Diagnosis not present

## 2020-05-06 DIAGNOSIS — J471 Bronchiectasis with (acute) exacerbation: Secondary | ICD-10-CM | POA: Diagnosis not present

## 2020-05-06 DIAGNOSIS — I700 Atherosclerosis of aorta: Secondary | ICD-10-CM | POA: Diagnosis not present

## 2020-05-06 DIAGNOSIS — R062 Wheezing: Secondary | ICD-10-CM | POA: Diagnosis not present

## 2020-05-07 MED ORDER — NOVOLIN 70/30 FLEXPEN (70-30) 100 UNIT/ML ~~LOC~~ SUPN: 15.0000 [IU] | PEN_INJECTOR | Freq: Two times a day (BID) | SUBCUTANEOUS | 11 refills | Status: DC

## 2020-05-07 NOTE — Telephone Encounter (Signed)
Please advise 

## 2020-05-07 NOTE — Addendum Note (Signed)
Addended by: [name] L on: 05/07/2020 02:05 PM   Modules accepted: Orders

## 2020-05-07 NOTE — Telephone Encounter (Signed)
Patient is calling back with blood sugar readings. She said it was 320 on the 08/18, 304 on the 08/19 and 270 at 5am this morning. She also said that her blood pressure was 107/52 this morning. She wants to know if she can have something sent in for the boil on her hip area as well. Please call her back at [phone] and advise.

## 2020-05-07 NOTE — Telephone Encounter (Signed)
Patient notified and verbalized understanding, repeating instructions back to me.

## 2020-05-07 NOTE — Telephone Encounter (Signed)
Looks like she was prescribed oral prednisone again by pulmonology. She needs to resume Novolin 70/30 15unit BID with meals. Continue glucose check BID before meals. Maintain upcoming appt with me

## 2020-05-10 ENCOUNTER — Telehealth: Payer: Self-pay | Admitting: Nurse Practitioner

## 2020-05-10 NOTE — Telephone Encounter (Signed)
Spoke with pharmacy and confirmed pt has refills on file and informed patient she could go to the pharmacy to pick up Rx.

## 2020-05-10 NOTE — Telephone Encounter (Signed)
Patient requests prescription for Novolin 70/30 100 Flexpen. Also, wants to know if she can use her old Novolin 70/30, 15 units until she gets this prescription.

## 2020-05-11 ENCOUNTER — Telehealth: Payer: Self-pay

## 2020-05-11 NOTE — Telephone Encounter (Signed)
    MA8/24/2021 1st Attempt  Name: Teretha Chalupa   MRN: [id number]   DOB: Nov 01, 1945   AGE: 74 y.o.   GENDER: female   PCP [name], [name], NP.   05/11/20 Spoke with patient about community resources for financial assistance with rent and utilities.  Resource given: [employer]gency Assistance [employer], [employer] and [employer].  Will follow-up with patient by 05/14/20.   Jabe Jeanbaptiste, AAS Paralegal, [place] . Embedded Care Coordination [facility]  Care Management  [address], [place] [address] [email or web address]  [phone]  [email or web address]

## 2020-05-11 NOTE — Telephone Encounter (Signed)
Candice Pineda from [facility] [address] called back and stated that they received a prescription for insulin and now they are needing pen needles, please advise. CB is [phone]

## 2020-05-11 NOTE — Telephone Encounter (Signed)
Spoke with pharmacy and confirmed pens needed and gave a verbal for 90 day supply.

## 2020-05-17 NOTE — Telephone Encounter (Signed)
    MA8/30/2021   Name: Candice Pineda   MRN: [id number]   DOB: May 03, 1946   AGE: 74 y.o.   GENDER: female   PCP [name], [name], NP.   05/17/20 Spoke with patient about resources given for assistance wity utilities and rent. Patient has called resources but was told she had to wait until after Sept. 1 to apply for assistance.  She plans on calling again this week.  No other resources are needed at this time.  Closing referral.    Avrie Kedzierski, AAS Paralegal, [place] . Embedded Care Coordination [facility]  Care Management  [address], [place] [address] [email or web address]  [phone]   [email or web address]

## 2020-05-25 ENCOUNTER — Other Ambulatory Visit: Payer: Self-pay | Admitting: Nurse Practitioner

## 2020-05-26 ENCOUNTER — Other Ambulatory Visit: Payer: Self-pay

## 2020-05-26 NOTE — Telephone Encounter (Signed)
Please inquire from patient who prescribed HCTZ and potassium in past?

## 2020-05-26 NOTE — Telephone Encounter (Signed)
Pt states medications were prescribed by her previous PCP Dr. Loistine Simas. Pt states she may also need her metformin and Losartan. Pt has appointment in the morning and was informed to fast.

## 2020-05-27 ENCOUNTER — Ambulatory Visit (INDEPENDENT_AMBULATORY_CARE_PROVIDER_SITE_OTHER): Payer: HMO | Admitting: Nurse Practitioner

## 2020-05-27 ENCOUNTER — Encounter: Payer: Self-pay | Admitting: Nurse Practitioner

## 2020-05-27 VITALS — BP 110/60 | HR 80 | Temp 97.0°F | Ht 70.0 in | Wt 176.8 lb

## 2020-05-27 DIAGNOSIS — I10 Essential (primary) hypertension: Secondary | ICD-10-CM | POA: Diagnosis not present

## 2020-05-27 DIAGNOSIS — Z1231 Encounter for screening mammogram for malignant neoplasm of breast: Secondary | ICD-10-CM

## 2020-05-27 DIAGNOSIS — R700 Elevated erythrocyte sedimentation rate: Secondary | ICD-10-CM

## 2020-05-27 DIAGNOSIS — E1169 Type 2 diabetes mellitus with other specified complication: Secondary | ICD-10-CM | POA: Diagnosis not present

## 2020-05-27 DIAGNOSIS — Z780 Asymptomatic menopausal state: Secondary | ICD-10-CM

## 2020-05-27 LAB — BASIC METABOLIC PANEL
BUN: 16 mg/dL (ref 6–23)
CO2: 31 mEq/L (ref 19–32)
Calcium: 9.9 mg/dL (ref 8.4–10.5)
Chloride: 101 mEq/L (ref 96–112)
Creatinine, Ser: 0.79 mg/dL (ref 0.40–1.20)
GFR: 70.85 mL/min (ref >60.00)
Glucose, Bld: 168 mg/dL — ABNORMAL HIGH (ref 70–99)
Potassium: 3.3 mEq/L — ABNORMAL LOW (ref 3.5–5.1)
Sodium: 140 mEq/L (ref 135–145)

## 2020-05-27 LAB — SEDIMENTATION RATE: Sed Rate: 85 mm/hr — ABNORMAL HIGH (ref 0–30)

## 2020-05-27 MED ORDER — POTASSIUM CHLORIDE CRYS ER 20 MEQ PO TBCR: 20.0000 meq | EXTENDED_RELEASE_TABLET | Freq: Every day | ORAL | 1 refills | Status: DC

## 2020-05-27 MED ORDER — METFORMIN HCL 500 MG PO TABS: 500.0000 mg | ORAL_TABLET | Freq: Two times a day (BID) | ORAL | 3 refills | Status: DC

## 2020-05-27 MED ORDER — HYDROCHLOROTHIAZIDE 25 MG PO TABS: 25.0000 mg | ORAL_TABLET | Freq: Every day | ORAL | 1 refills | Status: DC

## 2020-05-27 NOTE — Progress Notes (Signed)
Subjective:  Patient ID: Candice Pineda, female    DOB: [birth year]  Age: 74 y.o. MRN: [id number]  CC: Follow-up (4 week follow up on DM and HTN/ Pt is fasting today/Blood sugars have been ranging between 135-166)  HPI  Essential hypertension Stable BP BP Readings from Last 3 Encounters:  05/27/20 110/60  04/27/20 128/70  04/13/20 (!) 98/60   Continue current medications  Diabetes mellitus ([place]) Home glucose: 135-166 Denies any hypoglycemia  Continue current medications: metformin and novolin 70/30 F/up in 19months   Reviewed past Medical, Social and Family history today.  Outpatient Medications Prior to Visit  Medication Sig Dispense Refill  . albuterol (VENTOLIN HFA) 108 (90 Base) MCG/ACT inhaler INHALE 2 PUFFS EVERY 4 HOURS AS NEEDED WHEEZING AND FOR SHORTNESS OF BREATH    . aspirin EC 81 MG tablet Take 81 mg by mouth daily.    . Blood Glucose Calibration (ACCU-CHEK AVIVA) SOLN USE AS DIRECTED    . Blood Glucose Monitoring Suppl (ACCU-CHEK AVIVA) device Use daily to check blood sugar daily    . budesonide-formoterol (SYMBICORT) 80-4.5 MCG/ACT inhaler Inhale 2 puffs into the lungs 2 (two) times daily.    . calcium-vitamin D (OSCAL WITH D) 500-200 MG-UNIT tablet Take 1 tablet by mouth 2 (two) times daily.    . insulin isophane & regular human (NOVOLIN 70/30 FLEXPEN) (70-30) 100 UNIT/ML KwikPen Inject 15 Units into the skin 2 (two) times daily with a meal. 15 mL 11  . Lancets (ONETOUCH ULTRASOFT) lancets 1 each by Other route in the morning and at bedtime. Use as instructed 200 each 3  . losartan (COZAAR) 25 MG tablet Take 1 tablet (25 mg total) by mouth daily. 90 tablet 3  . Multiple Vitamin (MULTI-VITAMIN DAILY PO) Take 1 tablet by mouth daily.    Marland Kitchen omeprazole (PRILOSEC) 40 MG capsule Take 40 mg by mouth daily.    Glory Rosebush ULTRA test strip 1 each by Other route in the morning and at bedtime. Use as instructed 200 each 3  . pyridostigmine (MESTINON) 60 MG tablet Take 1 tablet  (60 mg total) by mouth 3 (three) times daily. (Patient taking differently: Take 30 mg by mouth 3 (three) times daily. ) 90 tablet 5  . simvastatin (ZOCOR) 40 MG tablet Take 1 tablet (40 mg total) by mouth daily. 30 tablet 3  . hydrochlorothiazide (HYDRODIURIL) 25 MG tablet Take 25 mg by mouth daily.    . metFORMIN (GLUCOPHAGE) 500 MG tablet Take 500 mg by mouth 2 (two) times daily with a meal.    . potassium chloride SA (K-DUR,KLOR-CON) 20 MEQ tablet Take 20 mEq by mouth 2 (two) times daily.    Marland Kitchen triamcinolone ointment (KENALOG) 0.5 % Apply 3-4 times as needed.  Do not use on face. (Patient not taking: Reported on 05/27/2020)     No facility-administered medications prior to visit.    ROS See HPI  Objective:  BP 110/60 (BP Location: Left Arm, Patient Position: Sitting, Cuff Size: Normal)   Pulse 80   Temp (!) 97 F (36.1 C) (Temporal)   Ht 5\' 10"  (1.778 m)   Wt 176 lb 12.8 oz (80.2 kg)   SpO2 99%   BMI 25.37 kg/m   Physical Exam Vitals reviewed.  Constitutional:      Appearance: She is obese.  Cardiovascular:     Rate and Rhythm: Normal rate and regular rhythm.     Pulses: Normal pulses.     Heart sounds: Normal heart sounds.  Pulmonary:  Effort: Pulmonary effort is normal.     Breath sounds: Normal breath sounds.  Musculoskeletal:     Right lower leg: Edema present.     Left lower leg: Edema present.     Comments: Improved LE edema  Skin:    General: Skin is warm and dry.     Findings: No erythema.  Neurological:     Mental Status: She is alert and oriented to person, place, and time.  Psychiatric:        Mood and Affect: Mood normal.        Behavior: Behavior normal.        Thought Content: Thought content normal.     Assessment & Plan:  This visit occurred during the SARS-CoV-2 public health emergency.  Safety protocols were in place, including screening questions prior to the visit, additional usage of staff PPE, and extensive cleaning of exam room while  observing appropriate contact time as indicated for disinfecting solutions.   Mikal was seen today for follow-up.  Diagnoses and all orders for this visit:  Essential hypertension -     hydrochlorothiazide (HYDRODIURIL) 25 MG tablet; Take 1 tablet (25 mg total) by mouth daily. -     potassium chloride SA (KLOR-CON) 20 MEQ tablet; Take 1 tablet (20 mEq total) by mouth daily. -     Basic metabolic panel  Type 2 diabetes mellitus with other specified complication, without long-term current use of insulin (HCC) -     metFORMIN (GLUCOPHAGE) 500 MG tablet; Take 1 tablet (500 mg total) by mouth 2 (two) times daily with a meal. -     Basic metabolic panel  Elevated sed rate -     Sedimentation rate  Asymptomatic postmenopausal estrogen deficiency -     DG Bone Density; Future  Encounter for screening mammogram for malignant neoplasm of breast -     MM Digital Screening; Future    Problem List Items Addressed This Visit      Cardiovascular and Mediastinum   Essential hypertension - Primary    Stable BP BP Readings from Last 3 Encounters:  05/27/20 110/60  04/27/20 128/70  04/13/20 (!) 98/60   Continue current medications      Relevant Medications   hydrochlorothiazide (HYDRODIURIL) 25 MG tablet   potassium chloride SA (KLOR-CON) 20 MEQ tablet   Other Relevant Orders   Basic metabolic panel (Completed)     Endocrine   Diabetes mellitus ([place])    Home glucose: 135-166 Denies any hypoglycemia  Continue current medications: metformin and novolin 70/30 F/up in 72months      Relevant Medications   metFORMIN (GLUCOPHAGE) 500 MG tablet   Other Relevant Orders   Basic metabolic panel (Completed)     Other   Elevated sed rate   Relevant Orders   Sedimentation rate (Completed)    Other Visit Diagnoses    Asymptomatic postmenopausal estrogen deficiency       Relevant Orders   DG Bone Density   Encounter for screening mammogram for malignant neoplasm of breast        Relevant Orders   MM Digital Screening      Follow-up: Return in about 3 months (around 08/26/2020) for DM and HTN, hyperlipidemia (fasting).  [name], NP

## 2020-05-27 NOTE — Patient Instructions (Addendum)
Maintain current medications Go to lab for blood draw  You will be contacted to schedule appt for bone density and mammogram.

## 2020-05-27 NOTE — Assessment & Plan Note (Signed)
Stable BP BP Readings from Last 3 Encounters:  05/27/20 110/60  04/27/20 128/70  04/13/20 (!) 98/60   Continue current medications

## 2020-05-27 NOTE — Assessment & Plan Note (Signed)
Home glucose: 135-166 Denies any hypoglycemia  Continue current medications: metformin and novolin 70/30 F/up in 1months

## 2020-06-01 ENCOUNTER — Other Ambulatory Visit: Payer: Self-pay

## 2020-06-01 NOTE — Patient Outreach (Addendum)
  [facility]) Care Management Chronic Special Needs Program    06/01/2020  Name: Candice Pineda, DOB: 10-05-1945  MRN: [id number]   [name] is enrolled in a chronic special needs plan. RNCM received notification that client called the 24 hour nurse advice line. Per notification: "Member called the 24 hr. nurse line 05/31/20 at 1106am. Has a colonoscopy on 06/02/20 (starts prep on 06/01/20) and wants to know foods to avoid. RN advised member to avoid red meat, seeds, & foods with peelings and foods red in color. RN also advised, in AM have only liquids member can see through such as ginger ale, jello, broth".     RNCM called to follow up on nurse advice line call. Client denies any questions regarding prep. She report she has the instructions from the doctor for her prep.  Client reports she is having difficulty with paying some of her bills and would like to know if there are any resources to help her with her bills/medical bills.  Goals Addressed              This Visit's Progress   .  Patient Stated: Client will report communication with social worker regarding community resources (pt-stated)        Discuss with client resource needs. Client expressed difficulty paying medical expenses. Social work referral made.      Plan: RNCM will send referral to social work and continue to follow.   Thea Silversmith, RN, MSN, [place] [phone]

## 2020-06-02 DIAGNOSIS — C49A2 Gastrointestinal stromal tumor of stomach: Secondary | ICD-10-CM | POA: Diagnosis not present

## 2020-06-02 DIAGNOSIS — Z8603 Personal history of neoplasm of uncertain behavior: Secondary | ICD-10-CM | POA: Diagnosis not present

## 2020-06-02 DIAGNOSIS — Z86010 Personal history of colonic polyps: Secondary | ICD-10-CM | POA: Diagnosis not present

## 2020-06-02 DIAGNOSIS — K219 Gastro-esophageal reflux disease without esophagitis: Secondary | ICD-10-CM | POA: Diagnosis not present

## 2020-06-02 DIAGNOSIS — Z1211 Encounter for screening for malignant neoplasm of colon: Secondary | ICD-10-CM | POA: Diagnosis not present

## 2020-06-02 DIAGNOSIS — Z8719 Personal history of other diseases of the digestive system: Secondary | ICD-10-CM | POA: Diagnosis not present

## 2020-06-02 DIAGNOSIS — K6389 Other specified diseases of intestine: Secondary | ICD-10-CM | POA: Diagnosis not present

## 2020-06-02 DIAGNOSIS — C49A0 Gastrointestinal stromal tumor, unspecified site: Secondary | ICD-10-CM | POA: Diagnosis not present

## 2020-06-02 DIAGNOSIS — K5730 Diverticulosis of large intestine without perforation or abscess without bleeding: Secondary | ICD-10-CM | POA: Diagnosis not present

## 2020-06-03 ENCOUNTER — Other Ambulatory Visit: Payer: Self-pay | Admitting: *Deleted

## 2020-06-03 NOTE — Patient Outreach (Signed)
[facility]) Care Management  06/03/2020  Elysse Polidore 1945/11/26 [id number]   CSW received a call from pt today requesting assistance with bills.  Pt shared with CSW that she received a call from [facility] wanting her to apply for credit before her procedure.  CSW advised pt she would need to talk back to [facility] about any arrangements with them.  CSW encouraged pt to seek family assistance with this if she is uncertain/confused.  CSW offered pt info on community resources to outreach for possible assistance with bills, as well as food pantry sites/resources which will be mailed to her.   CSW will plan to touch base with pt again next week.   Eduard Clos, MSW, [place] Worker  [place] [phone]

## 2020-06-07 DIAGNOSIS — Z20828 Contact with and (suspected) exposure to other viral communicable diseases: Secondary | ICD-10-CM | POA: Diagnosis not present

## 2020-06-07 DIAGNOSIS — B974 Respiratory syncytial virus as the cause of diseases classified elsewhere: Secondary | ICD-10-CM | POA: Diagnosis not present

## 2020-06-07 DIAGNOSIS — J101 Influenza due to other identified influenza virus with other respiratory manifestations: Secondary | ICD-10-CM | POA: Diagnosis not present

## 2020-06-07 DIAGNOSIS — U071 COVID-19: Secondary | ICD-10-CM | POA: Diagnosis not present

## 2020-06-08 ENCOUNTER — Other Ambulatory Visit: Payer: Self-pay | Admitting: *Deleted

## 2020-06-08 ENCOUNTER — Telehealth: Payer: Self-pay | Admitting: Nurse Practitioner

## 2020-06-08 NOTE — Telephone Encounter (Signed)
Patient is requesting new referral for bone density test. She states she has appt scheduled 09/15/20 in [place] but she would like a location closer to her home in [place].

## 2020-06-08 NOTE — Patient Outreach (Signed)
[facility]) Care Management  06/08/2020  Candice Pineda [birth year] [id number]   CSW made contact with pt and provided her with numbers to call for emergency assistance with bills through the [place].  CSW instructed pt to call to apply.  CSW also advised pt to contact someone at [facility] about her bill there; to see if they have assistance to offer.   CSW will touch base with pt again in 3-4 weeks for updates on the above.  CSW reminded pt to call if problems or needs arise prior to callback.   Eduard Clos, MSW, [place] Worker  [place] [phone]

## 2020-06-09 NOTE — Telephone Encounter (Signed)
Informed Aniceto Boss of this and he confirms he will look into this and follow up with patient.

## 2020-06-28 ENCOUNTER — Other Ambulatory Visit: Payer: Self-pay | Admitting: *Deleted

## 2020-06-28 ENCOUNTER — Telehealth: Payer: Self-pay | Admitting: Nurse Practitioner

## 2020-06-28 NOTE — Telephone Encounter (Signed)
Patient called and stated that she has been having frequent urination and diarrhea. Patient is asking if Baldo Ash can send a prescription in for an antibiotic to the pharmacy. Pt declined appointment, please advise. CB is [phone]

## 2020-06-29 ENCOUNTER — Encounter (HOSPITAL_BASED_OUTPATIENT_CLINIC_OR_DEPARTMENT_OTHER): Payer: Self-pay

## 2020-06-29 ENCOUNTER — Ambulatory Visit (HOSPITAL_BASED_OUTPATIENT_CLINIC_OR_DEPARTMENT_OTHER)
Admission: RE | Admit: 2020-06-29 | Discharge: 2020-06-29 | Disposition: A | Discharge status: Home / Self Care | Payer: HMO | Source: Ambulatory Visit | Attending: Nurse Practitioner | Admitting: Nurse Practitioner

## 2020-06-29 ENCOUNTER — Other Ambulatory Visit: Payer: Self-pay

## 2020-06-29 DIAGNOSIS — Z780 Asymptomatic menopausal state: Secondary | ICD-10-CM | POA: Diagnosis not present

## 2020-06-29 DIAGNOSIS — Z1231 Encounter for screening mammogram for malignant neoplasm of breast: Secondary | ICD-10-CM | POA: Diagnosis not present

## 2020-06-29 DIAGNOSIS — M8589 Other specified disorders of bone density and structure, multiple sites: Secondary | ICD-10-CM | POA: Diagnosis not present

## 2020-06-29 DIAGNOSIS — E280 Estrogen excess: Secondary | ICD-10-CM | POA: Diagnosis not present

## 2020-06-29 DIAGNOSIS — E2839 Other primary ovarian failure: Secondary | ICD-10-CM | POA: Diagnosis not present

## 2020-06-29 DIAGNOSIS — Z90710 Acquired absence of both cervix and uterus: Secondary | ICD-10-CM | POA: Diagnosis not present

## 2020-06-29 NOTE — Patient Outreach (Signed)
[facility]) Care Management  06/29/2020  Candice Pineda 10-30-1945 [id number]   [place] spoke with pt by phone on 06/28/2020.  Pt reports she has not done anything more about her outstanding medical bills and is reminded to call [facility] billing department to seek assistance from them on options.   Pt does admit to having a difficult time managing her bills and wants to find someone to take this on. CSW discussed the possibility of a Law firm or other agency assisting with this and CSW will investigate options.  Per pt, her family is not able to help with the bills; stating, "my daughter had a stroke and my granddaughter is confined".  CSW will seek options and follow up with pt.   Eduard Clos, MSW, [place] Worker  [place] [phone]

## 2020-07-06 ENCOUNTER — Other Ambulatory Visit: Payer: Self-pay | Admitting: *Deleted

## 2020-07-06 ENCOUNTER — Telehealth: Payer: Self-pay | Admitting: Nurse Practitioner

## 2020-07-06 NOTE — Telephone Encounter (Signed)
Spoke with patient and gave results. 

## 2020-07-06 NOTE — Telephone Encounter (Signed)
Patient called and wanted to see if her results were back from her mammogram and bone density, please advise. CB is [phone]

## 2020-07-07 NOTE — Patient Outreach (Signed)
[name]) Care Management  07/07/2020  Gregg Holster 1945/11/21 [id number]   CSW spoke with pt to provide info on agency support to assist with her bill payments/management.  Pt advised that the [employer] cannot accept new referrals until the new year.  encouraged by CSW to seek assistance through friends, family as she can.  CSW will plan to make referral at the end of December in hopes of getting her arranged for the new year.  Pt ok with this plan.   Eduard Clos, MSW, [place] Worker  [place] [phone]

## 2020-07-08 ENCOUNTER — Other Ambulatory Visit: Payer: Self-pay | Admitting: Nurse Practitioner

## 2020-07-08 DIAGNOSIS — E7800 Pure hypercholesterolemia, unspecified: Secondary | ICD-10-CM

## 2020-07-09 NOTE — Telephone Encounter (Signed)
Last OV 05/27/20 Last fill 04/13/20  #30/3 Next fill 08/30/20

## 2020-07-21 DIAGNOSIS — J301 Allergic rhinitis due to pollen: Secondary | ICD-10-CM | POA: Diagnosis not present

## 2020-07-21 DIAGNOSIS — J0100 Acute maxillary sinusitis, unspecified: Secondary | ICD-10-CM | POA: Diagnosis not present

## 2020-07-23 DIAGNOSIS — R058 Other specified cough: Secondary | ICD-10-CM | POA: Diagnosis not present

## 2020-07-23 DIAGNOSIS — R918 Other nonspecific abnormal finding of lung field: Secondary | ICD-10-CM | POA: Diagnosis not present

## 2020-07-23 DIAGNOSIS — R1084 Generalized abdominal pain: Secondary | ICD-10-CM | POA: Diagnosis not present

## 2020-07-23 DIAGNOSIS — I700 Atherosclerosis of aorta: Secondary | ICD-10-CM | POA: Diagnosis not present

## 2020-07-23 DIAGNOSIS — R197 Diarrhea, unspecified: Secondary | ICD-10-CM | POA: Diagnosis not present

## 2020-07-23 DIAGNOSIS — K429 Umbilical hernia without obstruction or gangrene: Secondary | ICD-10-CM | POA: Diagnosis not present

## 2020-07-23 DIAGNOSIS — R059 Cough, unspecified: Secondary | ICD-10-CM | POA: Diagnosis not present

## 2020-07-26 ENCOUNTER — Other Ambulatory Visit: Payer: Self-pay

## 2020-07-27 ENCOUNTER — Telehealth (INDEPENDENT_AMBULATORY_CARE_PROVIDER_SITE_OTHER): Payer: HMO | Admitting: Nurse Practitioner

## 2020-07-27 ENCOUNTER — Other Ambulatory Visit: Payer: HMO

## 2020-07-27 ENCOUNTER — Other Ambulatory Visit: Payer: Self-pay | Admitting: Nurse Practitioner

## 2020-07-27 ENCOUNTER — Encounter: Payer: Self-pay | Admitting: Nurse Practitioner

## 2020-07-27 VITALS — Ht 70.0 in | Wt 176.0 lb

## 2020-07-27 DIAGNOSIS — E1169 Type 2 diabetes mellitus with other specified complication: Secondary | ICD-10-CM | POA: Diagnosis not present

## 2020-07-27 DIAGNOSIS — R197 Diarrhea, unspecified: Secondary | ICD-10-CM

## 2020-07-27 LAB — CBC WITH DIFFERENTIAL/PLATELET
Basophils Absolute: 0.1 10*3/uL (ref 0.0–0.1)
Basophils Relative: 0.6 % (ref 0.0–3.0)
Eosinophils Absolute: 0.1 10*3/uL (ref 0.0–0.7)
Eosinophils Relative: 1.1 % (ref 0.0–5.0)
HCT: 36.6 % (ref 36.0–46.0)
Hemoglobin: 11.6 g/dL — ABNORMAL LOW (ref 12.0–15.0)
Lymphocytes Relative: 46.6 % — ABNORMAL HIGH (ref 12.0–46.0)
Lymphs Abs: 5.3 10*3/uL — ABNORMAL HIGH (ref 0.7–4.0)
MCHC: 31.7 g/dL (ref 30.0–36.0)
MCV: 69.4 fl — ABNORMAL LOW (ref 78.0–100.0)
Monocytes Absolute: 0.9 10*3/uL (ref 0.1–1.0)
Monocytes Relative: 8.2 % (ref 3.0–12.0)
Neutro Abs: 4.9 10*3/uL (ref 1.4–7.7)
Neutrophils Relative %: 43.5 % (ref 43.0–77.0)
Platelets: 309 10*3/uL (ref 150.0–400.0)
RBC: 5.27 Mil/uL — ABNORMAL HIGH (ref 3.87–5.11)
RDW: 15.7 % — ABNORMAL HIGH (ref 11.5–15.5)
WBC: 11.3 10*3/uL — ABNORMAL HIGH (ref 4.0–10.5)

## 2020-07-27 LAB — BASIC METABOLIC PANEL
BUN: 11 mg/dL (ref 6–23)
CO2: 29 mEq/L (ref 19–32)
Calcium: 9.6 mg/dL (ref 8.4–10.5)
Chloride: 100 mEq/L (ref 96–112)
Creatinine, Ser: 0.79 mg/dL (ref 0.40–1.20)
GFR: 73.03 mL/min (ref >60.00)
Glucose, Bld: 103 mg/dL — ABNORMAL HIGH (ref 70–99)
Potassium: 3.3 mEq/L — ABNORMAL LOW (ref 3.5–5.1)
Sodium: 137 mEq/L (ref 135–145)

## 2020-07-27 LAB — HEMOGLOBIN A1C: Hgb A1c MFr Bld: 8.7 % — ABNORMAL HIGH (ref 4.6–6.5)

## 2020-07-27 MED ORDER — CHOLESTYRAMINE 4 G PO PACK: 4.0000 g | PACK | Freq: Two times a day (BID) | ORAL | 0 refills | Status: DC

## 2020-07-27 MED ORDER — DIPHENOXYLATE-ATROPINE 2.5-0.025 MG PO TABS: 1.0000 | ORAL_TABLET | Freq: Three times a day (TID) | ORAL | 0 refills | Status: DC | PRN

## 2020-07-27 NOTE — Addendum Note (Signed)
Addended by: Lynnea Ferrier on: 07/27/2020 11:52 AM   Modules accepted: Orders

## 2020-07-27 NOTE — Patient Instructions (Signed)
Go to lab for blood draw and stool kit collection. Hold augmentin, metformin, imodium and peptobismol. Check glucose TID, do not take insulin if glucose <150. Do not start lomotil and questran till after stool sample is returned to lab. Maintain adequate oral hydration with water and zero gartorade. Maintain Brat diet.  Food Choices to Help Relieve Diarrhea, Adult When you have diarrhea, the foods you eat and your eating habits are very important. Choosing the right foods and drinks can help:  Relieve diarrhea.  Replace lost fluids and nutrients.  Prevent dehydration. What general guidelines should I follow?  Relieving diarrhea  Choose foods with less than 2 g or .07 oz. of fiber per serving.  Limit fats to less than 8 tsp (38 g or 1.34 oz.) a day.  Avoid the following: ? Foods and beverages sweetened with high-fructose corn syrup, honey, or sugar alcohols such as xylitol, sorbitol, and mannitol. ? Foods that contain a lot of fat or sugar. ? Fried, greasy, or spicy foods. ? High-fiber grains, breads, and cereals. ? Raw fruits and vegetables.  Eat foods that are rich in probiotics. These foods include dairy products such as yogurt and fermented milk products. They help increase healthy bacteria in the stomach and intestines (gastrointestinal tract, or GI tract).  If you have lactose intolerance, avoid dairy products. These may make your diarrhea worse.  Take medicine to help stop diarrhea (antidiarrheal medicine) only as told by your health care provider. Replacing nutrients  Eat small meals or snacks every 3-4 hours.  Eat bland foods, such as white rice, toast, or baked potato, until your diarrhea starts to get better. Gradually reintroduce nutrient-rich foods as tolerated or as told by your health care provider. This includes: ? Well-cooked protein foods. ? Peeled, seeded, and soft-cooked fruits and vegetables. ? Low-fat dairy products.  Take vitamin and mineral  supplements as told by your health care provider. Preventing dehydration  Start by sipping water or a special solution to prevent dehydration (oral rehydration solution, ORS). Urine that is clear or pale yellow means that you are getting enough fluid.  Try to drink at least 8-10 cups of fluid each day to help replace lost fluids.  You may add other liquids in addition to water, such as clear juice or decaffeinated sports drinks, as tolerated or as told by your health care provider.  Avoid drinks with caffeine, such as coffee, tea, or soft drinks.  Avoid alcohol. What foods are recommended?     The items listed may not be a complete list. Talk with your health care provider about what dietary choices are best for you. Grains White rice. White, [place], or pita breads (fresh or toasted), including plain rolls, buns, or bagels. White pasta. Saltine, soda, or graham crackers. Pretzels. Low-fiber cereal. Cooked cereals made with water (such as cornmeal, farina, or cream cereals). Plain muffins. Matzo. Melba toast. Zwieback. Vegetables Potatoes (without the skin). Most well-cooked and canned vegetables without skins or seeds. Tender lettuce. Fruits Apple sauce. Fruits canned in juice. Cooked apricots, cherries, grapefruit, peaches, pears, or plums. Fresh bananas and cantaloupe. Meats and other protein foods Baked or boiled chicken. Eggs. Tofu. Fish. Seafood. Smooth nut butters. Ground or well-cooked tender beef, ham, veal, lamb, pork, or poultry. Dairy Plain yogurt, kefir, and unsweetened liquid yogurt. Lactose-free milk, buttermilk, skim milk, or soy milk. Low-fat or nonfat hard cheese. Beverages Water. Low-calorie sports drinks. Fruit juices without pulp. Strained tomato and vegetable juices. Decaffeinated teas. Sugar-free beverages not sweetened with sugar  alcohols. Oral rehydration solutions, if approved by your health care provider. Seasoning and other foods Bouillon, broth, or soups made  from recommended foods. What foods are not recommended? The items listed may not be a complete list. Talk with your health care provider about what dietary choices are best for you. Grains Whole grain, whole wheat, bran, or rye breads, rolls, pastas, and crackers. Wild or brown rice. Whole grain or bran cereals. Barley. Oats and oatmeal. Corn tortillas or taco shells. Granola. Popcorn. Vegetables Raw vegetables. Fried vegetables. Cabbage, broccoli, Brussels sprouts, artichokes, baked beans, beet greens, corn, kale, legumes, peas, sweet potatoes, and yams. Potato skins. Cooked spinach and cabbage. Fruits Dried fruit, including raisins and dates. Raw fruits. Stewed or dried prunes. Canned fruits with syrup. Meat and other protein foods Fried or fatty meats. Deli meats. Chunky nut butters. Nuts and seeds. Beans and lentils. Berniece Salines. Hot dogs. Sausage. Dairy High-fat cheeses. Whole milk, chocolate milk, and beverages made with milk, such as milk shakes. Half-and-half. Cream. sour cream. Ice cream. Beverages Caffeinated beverages (such as coffee, tea, soda, or energy drinks). Alcoholic beverages. Fruit juices with pulp. Prune juice. Soft drinks sweetened with high-fructose corn syrup or sugar alcohols. High-calorie sports drinks. Fats and oils Butter. Cream sauces. Margarine. Salad oils. Plain salad dressings. Olives. Avocados. Mayonnaise. Sweets and desserts Sweet rolls, doughnuts, and sweet breads. Sugar-free desserts sweetened with sugar alcohols such as xylitol and sorbitol. Seasoning and other foods Honey. Hot sauce. Chili powder. Gravy. Cream-based or milk-based soups. Pancakes and waffles. Summary  When you have diarrhea, the foods you eat and your eating habits are very important.  Make sure you get at least 8-10 cups of fluid each day, or enough to keep your urine clear or pale yellow.  Eat bland foods and gradually reintroduce healthy, nutrient-rich foods as tolerated, or as told by  your health care provider.  Avoid high-fiber, fried, greasy, or spicy foods. This information is not intended to replace advice given to you by your health care provider. Make sure you discuss any questions you have with your health care provider. Document Revised: 12/26/2018 Document Reviewed: 09/01/2016 Elsevier Patient Education  [place].

## 2020-07-27 NOTE — Progress Notes (Signed)
Virtual Visit via Telephone Note  I connected with Candice Pineda on 07/27/20 at 10:15 AM EST by telephone and verified that I am speaking with the correct person using two identifiers.  Location: Patient:home Provider: office Participants: patient and provider.   I discussed the limitations, risks, security and privacy concerns of performing an evaluation and management service by telephone and the availability of in person appointments. I also discussed with the patient that there may be a patient responsible charge related to this service. The patient expressed understanding and agreed to proceed.  CC: diarrhea x4weeks.  History of Present Illness: Diarrhea  This is a new problem. The current episode started 1 to 4 weeks ago. The problem occurs more than 10 times per day. The problem has been unchanged. The stool consistency is described as watery. The patient states that diarrhea does not awaken her from sleep. Associated symptoms include abdominal pain, bloating, increased flatus and a URI. Pertinent negatives include no arthralgias, chills, coughing, fever, headaches, myalgias, sweats, vomiting or weight loss. Nothing aggravates the symptoms. There are no known risk factors. She has tried anti-motility drug, bismuth subsalicylate, change of diet and increased fluids for the symptoms. The treatment provided no relief.  ongoing diarrhea prior to augmentin rx. Reviewed [facility] ED notes, lab results, and radiology report. CT ABD/pelvisdone 07/21/2020: no acute finding. Today's glucose: 135.  Observations/Objective: Alert and oriented x4 Limited exam due to televisit  Assessment and Plan: Jilene was seen today for acute visit.  Diagnoses and all orders for this visit:  Diarrhea, unspecified type -     Stool culture; Future -     C. difficile GDH and Toxin A/B; Future -     Basic metabolic panel -     CBC w/Diff -     cholestyramine (QUESTRAN) 4 g packet; Take 1 packet (4 g total) by mouth  2 (two) times daily. -     diphenoxylate-atropine (LOMOTIL) 2.5-0.025 MG tablet; Take 1 tablet by mouth 3 (three) times daily as needed for diarrhea or loose stools. If >4loose stools per day  Type 2 diabetes mellitus with other specified complication, without long-term current use of insulin (HCC) -     Basic metabolic panel -     Hemoglobin A1c   Follow Up Instructions: Go to lab for blood draw and stool kit collection. Hold augmentin, metformin, imodium and peptobismol. Check glucose TID, do not take insulin if glucose <150. Do not start lomotil and questran till after stool sample is returned to lab. Maintain adequate oral hydration with water and zero gartorade. Maintain Brat diet.   I discussed the assessment and treatment plan with the patient. The patient was provided an opportunity to ask questions and all were answered. The patient agreed with the plan and demonstrated an understanding of the instructions.   The patient was advised to call back or seek an in-person evaluation if the symptoms worsen or if the condition fails to improve as anticipated.  I provided 16 minutes of non-face-to-face time during this encounter.  [name], NP

## 2020-07-27 NOTE — Addendum Note (Signed)
Addended by: Lynnea Ferrier on: 07/27/2020 11:38 AM   Modules accepted: Orders

## 2020-07-30 LAB — C. DIFFICILE GDH AND TOXIN A/B
GDH ANTIGEN: DETECTED
MICRO NUMBER:: 11179773
SPECIMEN QUALITY:: ADEQUATE
TOXIN A AND B: NOT DETECTED

## 2020-07-30 LAB — SALMONELLA/SHIGELLA CULT, CAMPY EIA AND SHIGA TOXIN RFL ECOLI
MICRO NUMBER: 11181008
MICRO NUMBER:: 11181009
MICRO NUMBER:: 11181010
Result:: NOT DETECTED
SHIGA RESULT:: NOT DETECTED
SPECIMEN QUALITY: ADEQUATE
SPECIMEN QUALITY:: ADEQUATE
SPECIMEN QUALITY:: ADEQUATE

## 2020-07-30 LAB — CLOSTRIDIUM DIFFICILE TOXIN B, QUALITATIVE, REAL-TIME PCR: Toxigenic C. Difficile by PCR: NOT DETECTED

## 2020-08-03 DIAGNOSIS — J479 Bronchiectasis, uncomplicated: Secondary | ICD-10-CM | POA: Diagnosis not present

## 2020-08-04 ENCOUNTER — Other Ambulatory Visit: Payer: Self-pay | Admitting: *Deleted

## 2020-08-04 NOTE — Patient Outreach (Signed)
[facility]) Care Management  08/04/2020  Candice Pineda 1946-07-09 [id number]   CSW made a follow up call to pt today who reports she has not followed up on the resources provided to assist her with her needs.  Pt reports she does not want to mess with it now; stating, "it will be ok".  CSW reminded pt of the suggestion made to seek support from a Guardianship program to manage her bills as she had previously voiced as a need/interest.  Pt indicates she will outreach a later time if she changes her mind.  CSW provided pt with contact info to reach CSW if needs arise.  CSW will close referral at this time. CSW will advise PCP and [facility] team of above.    Eduard Clos, MSW, [place] Worker  [name] [phone]

## 2020-08-05 ENCOUNTER — Telehealth: Payer: Self-pay

## 2020-08-05 DIAGNOSIS — R399 Unspecified symptoms and signs involving the genitourinary system: Secondary | ICD-10-CM

## 2020-08-05 DIAGNOSIS — R197 Diarrhea, unspecified: Secondary | ICD-10-CM | POA: Diagnosis not present

## 2020-08-05 DIAGNOSIS — N3001 Acute cystitis with hematuria: Secondary | ICD-10-CM | POA: Diagnosis not present

## 2020-08-05 NOTE — Telephone Encounter (Signed)
Pt calling to inform doctor that she has had blood in urine starting this morning, pt said that there is also a burning sensation during urination, along with frequent urination.  I informed the pt that I would send message to nurse and that I would also place her on [name]'s schedule for tomorrow as there are not any openings today.  I also suggested to pt to head to urgent care if bleeding consistency is heavy or not stopping for at least  Hour.  Please advise. CB# [phone]

## 2020-08-05 NOTE — Telephone Encounter (Addendum)
Pt informed me that she went to walk in clinic this morning.  Pt was prescribed medication for a bladder infection, per patient. Patient is scheduled for a office visit on 08/30/2020.  FYI

## 2020-08-05 NOTE — Telephone Encounter (Signed)
I called pt but VM not set up and other home phone # is disconnected.  I will continue to reach through out the morning.

## 2020-08-05 NOTE — Telephone Encounter (Signed)
Please call and schedule lab appt to day for urine collection. Order entered Thank you

## 2020-08-06 ENCOUNTER — Ambulatory Visit: Payer: HMO | Admitting: Nurse Practitioner

## 2020-08-10 NOTE — Progress Notes (Signed)
No.  Joycelyn Schmid will come back this week to help with new patients.

## 2020-08-11 ENCOUNTER — Other Ambulatory Visit: Payer: Self-pay | Admitting: *Deleted

## 2020-08-12 NOTE — Patient Outreach (Signed)
[facility]) Care Management  08/12/2020  Candice Pineda 20-Apr-1946 [id number]   CSW received a call from pt on 08/11/2020 indicating she was needing help with her bills.  CSW reminded pt we had discussed this recently and she had declined.  She is now wanting to pursue this further.  Per pt, someone who made a home visit to her home was to help but she is unable to remember name/agency, etc.   CSW reminded pt about the agency that may be able to assist; [facility].  CSW will inquire with agency for their ability to take new referrals at this time.   CSW will also mail pt info on the DSS program that may also be of assistance with finances/payee.    CSW will plan a follow up call to pt in the next 2 weeks.   Eduard Clos, MSW, [place] Worker  [place] [phone]

## 2020-08-16 ENCOUNTER — Other Ambulatory Visit: Payer: Self-pay

## 2020-08-16 ENCOUNTER — Telehealth: Payer: Self-pay | Admitting: Nurse Practitioner

## 2020-08-16 NOTE — Patient Outreach (Signed)
  [facility]) Care Management Chronic Special Needs Program    08/16/2020  Name: Candice Pineda, DOB: 07-24-1946  MRN: [id number]   Candice Pineda is enrolled in a chronic special needs plan. RNCM received notification today, that client called on 08/11/20 and requested assistance with help paying some bills. Referral sent directly to Eduard Clos, LCSW with [facility], who last spoke with client on 08/04/20. Per chart, Ms. Caldwell followed up on this request on 08/11/20.  [place] Management will continue to provide services for this member through 09/17/2020. The [employer] Care Management Team will assume care 09/18/2020.    Thea Silversmith, RN, MSN, [place] [phone]

## 2020-08-16 NOTE — Telephone Encounter (Signed)
Patient is calling to see if she can get something sent in to her pharmacy. She said that she is still having issues with her bladder and she's urinating every hour. Please give her a call back at [phone] and advise.

## 2020-08-16 NOTE — Telephone Encounter (Signed)
Pt went to walk in clinic on 08/05/20 and was prescribed medication for bladder infection. Pt states she is still having frequent urination and is not scheduled to come into our office until 08/30/20 and would like to know what she can do until her next appointment?

## 2020-08-17 ENCOUNTER — Other Ambulatory Visit (INDEPENDENT_AMBULATORY_CARE_PROVIDER_SITE_OTHER): Payer: HMO

## 2020-08-17 ENCOUNTER — Other Ambulatory Visit: Payer: Self-pay

## 2020-08-17 DIAGNOSIS — R399 Unspecified symptoms and signs involving the genitourinary system: Secondary | ICD-10-CM

## 2020-08-17 NOTE — Telephone Encounter (Signed)
Advise to schedule lab appt for urinalysis. There is already an order in her chart. I will need to see results prior to making any medication recommendation.

## 2020-08-17 NOTE — Telephone Encounter (Signed)
LVM for pt to return call and schedule lab appointment.

## 2020-08-19 ENCOUNTER — Ambulatory Visit: Payer: Self-pay | Admitting: *Deleted

## 2020-08-19 ENCOUNTER — Other Ambulatory Visit: Payer: Self-pay

## 2020-08-19 LAB — URINALYSIS W MICROSCOPIC + REFLEX CULTURE
Bacteria, UA: NONE SEEN /HPF
Bilirubin Urine: NEGATIVE
Glucose, UA: NEGATIVE
Hgb urine dipstick: NEGATIVE
Hyaline Cast: NONE SEEN /LPF
Ketones, ur: NEGATIVE
Nitrites, Initial: NEGATIVE
Protein, ur: NEGATIVE
RBC / HPF: NONE SEEN /HPF (ref 0–2)
Specific Gravity, Urine: 1.01 (ref 1.001–1.03)
pH: <=5 (ref 5.0–8.0)

## 2020-08-19 LAB — URINE CULTURE
MICRO NUMBER:: 11259987
SPECIMEN QUALITY:: ADEQUATE

## 2020-08-19 LAB — CULTURE INDICATED

## 2020-08-19 NOTE — Patient Outreach (Signed)
  [facility]) Care Management Chronic Special Needs Program    08/19/2020  Name: Candice Pineda, DOB: 1946-03-09  MRN: [id number]   [place] Management Team has assumed care and services for this member. Case closed by [place] Management.  Thea Silversmith, RN, MSN, [place] [phone]

## 2020-08-20 ENCOUNTER — Other Ambulatory Visit: Payer: Self-pay

## 2020-08-20 DIAGNOSIS — R300 Dysuria: Secondary | ICD-10-CM

## 2020-08-20 DIAGNOSIS — R350 Frequency of micturition: Secondary | ICD-10-CM

## 2020-08-20 NOTE — Progress Notes (Signed)
Pt in need of referral for continuous urinary issues.

## 2020-08-20 NOTE — Telephone Encounter (Signed)
Pt left sample earlier this week and has been informed of results.   Pt states she has not been taking her Metformin because it was her understanding that you wanted her to stop and she wanted to know if she needs to start it back? Please advise

## 2020-08-20 NOTE — Telephone Encounter (Signed)
Has diarrhea resolved? What are her glucose readings?

## 2020-08-23 NOTE — Progress Notes (Deleted)
No chief complaint on file.    HISTORY OF PRESENT ILLNESS: Today 08/23/20  Candice Pineda is a 74 y.o. female here today for follow up for presyncopal events thought to be related to orthostatic hypotension. She was started on pyridostigmine 60mg  TID but was unable to tolerate dosing due to leg cramps. She was advised to continue 30mg  TID.   Echo was unremarkable. No cardiology referral was advised.    HISTORY (copied from previous note)    REVIEW OF SYSTEMS: Out of a complete 14 system review of symptoms, the patient complains only of the following symptoms, and all other reviewed systems are negative.   ALLERGIES: Allergies  Allergen Reactions  . Montelukast     Other reaction(s): Other (See Comments) Dizziness Dizziness   . Tetanus Toxoids     Arm extremely painful, arm turned red and swollen  . Alendronate Nausea Only    And loose stools. Patient states that she can and wants to continue this medication at this time. And loose stools. Patient states that she can and wants to continue this medication at this time.      HOME MEDICATIONS: Outpatient Medications Prior to Visit  Medication Sig Dispense Refill  . albuterol (VENTOLIN HFA) 108 (90 Base) MCG/ACT inhaler INHALE 2 PUFFS EVERY 4 HOURS AS NEEDED WHEEZING AND FOR SHORTNESS OF BREATH    . aspirin EC 81 MG tablet Take 81 mg by mouth daily.    Marland Kitchen azelastine (ASTELIN) 0.1 % nasal spray Place into the nose.    . Blood Glucose Calibration (ACCU-CHEK AVIVA) SOLN USE AS DIRECTED    . Blood Glucose Monitoring Suppl (ACCU-CHEK AVIVA) device Use daily to check blood sugar daily    . budesonide-formoterol (SYMBICORT) 80-4.5 MCG/ACT inhaler Inhale 2 puffs into the lungs 2 (two) times daily.    . calcium-vitamin D (OSCAL WITH D) 500-200 MG-UNIT tablet Take 1 tablet by mouth 2 (two) times daily.    . cetirizine (ZYRTEC) 10 MG tablet Take by mouth.    . cholestyramine (QUESTRAN) 4 g packet Take 1 packet (4 g total) by mouth 2  (two) times daily. 60 each 0  . diphenoxylate-atropine (LOMOTIL) 2.5-0.025 MG tablet Take 1 tablet by mouth 3 (three) times daily as needed for diarrhea or loose stools. If >4loose stools per day 15 tablet 0  . hydrochlorothiazide (HYDRODIURIL) 25 MG tablet Take 1 tablet (25 mg total) by mouth daily. 90 tablet 1  . insulin isophane & regular human (NOVOLIN 70/30 FLEXPEN) (70-30) 100 UNIT/ML KwikPen Inject 15 Units into the skin 2 (two) times daily with a meal. 15 mL 11  . Lancets (ONETOUCH ULTRASOFT) lancets 1 each by Other route in the morning and at bedtime. Use as instructed 200 each 3  . losartan (COZAAR) 25 MG tablet Take 1 tablet (25 mg total) by mouth daily. 90 tablet 3  . metFORMIN (GLUCOPHAGE) 500 MG tablet Take 1 tablet (500 mg total) by mouth 2 (two) times daily with a meal. 180 tablet 3  . Multiple Vitamin (MULTI-VITAMIN DAILY PO) Take 1 tablet by mouth daily.    Marland Kitchen omeprazole (PRILOSEC) 40 MG capsule Take 40 mg by mouth daily.    Glory Rosebush ULTRA test strip 1 each by Other route in the morning and at bedtime. Use as instructed 200 each 3  . potassium chloride SA (KLOR-CON) 20 MEQ tablet Take 1 tablet (20 mEq total) by mouth daily. 90 tablet 1  . pyridostigmine (MESTINON) 60 MG tablet Take 1 tablet (  60 mg total) by mouth 3 (three) times daily. (Patient taking differently: Take 30 mg by mouth 3 (three) times daily. ) 90 tablet 5  . simvastatin (ZOCOR) 40 MG tablet TAKE 1 TABLET(40 MG) BY MOUTH DAILY 30 tablet 3   No facility-administered medications prior to visit.     PAST MEDICAL HISTORY: Past Medical History:  Diagnosis Date  . Allergy   . Bronchiectasis ([place])   . Diabetes mellitus without complication ([place])   . Hypertension      PAST SURGICAL HISTORY: Past Surgical History:  Procedure Laterality Date  . ABDOMINAL HYSTERECTOMY    . BREAST BIOPSY Right   . HAND SURGERY Right    cyst removed  . STOMACH SURGERY       FAMILY HISTORY: Family History  Problem Relation  Age of Onset  . Hypertension Sister   . Hypertension Brother   . Stroke Daughter      SOCIAL HISTORY: Social History   Socioeconomic History  . Marital status: Widowed    Spouse name: Not on file  . Number of children: Not on file  . Years of education: Not on file  . Highest education level: Not on file  Occupational History  . Occupation: Retired  Tobacco Use  . Smoking status: Never Smoker  . Smokeless tobacco: Never Used  Vaping Use  . Vaping Use: Never used  Substance and Sexual Activity  . Alcohol use: Yes    Comment: occasional  . Drug use: Not Currently  . Sexual activity: Not Currently  Other Topics Concern  . Not on file  Social History Narrative   Right handed    Lives alone   Caffeine use: Coffee daily   Social Determinants of Health   Financial Resource Strain: Medium Risk  . Difficulty of Paying Living Expenses: Somewhat hard  Food Insecurity: No Food Insecurity  . Worried About Charity fundraiser in the Last Year: Never true  . Ran Out of Food in the Last Year: Never true  Transportation Needs: No Transportation Needs  . Lack of Transportation (Medical): No  . Lack of Transportation (Non-Medical): No  Physical Activity:   . Days of Exercise per Week: Not on file  . Minutes of Exercise per Session: Not on file  Stress: No Stress Concern Present  . Feeling of Stress : Not at all  Social Connections: Moderately Integrated  . Frequency of Communication with Friends and Family: Three times a week  . Frequency of Social Gatherings with Friends and Family: More than three times a week  . Attends Religious Services: More than 4 times per year  . Active Member of Clubs or Organizations: Yes  . Attends Archivist Meetings: More than 4 times per year  . Marital Status: Widowed  Intimate Partner Violence: Not At Risk  . Fear of Current or Ex-Partner: No  . Emotionally Abused: No  . Physically Abused: No  . Sexually Abused: No       PHYSICAL EXAM  There were no vitals filed for this visit. There is no height or weight on file to calculate BMI.   Generalized: Well developed, in no acute distress  Cardiology: normal rate and rhythm, no murmur auscultated  Respiratory: clear to auscultation bilaterally    Neurological examination  Mentation: Alert oriented to time, place, history taking. Follows all commands speech and language fluent Cranial nerve II-XII: Pupils were equal round reactive to light. Extraocular movements were full, visual field were full on confrontational test.  Facial sensation and strength were normal. Uvula tongue midline. Head turning and shoulder shrug  were normal and symmetric. Motor: The motor testing reveals 5 over 5 strength of all 4 extremities. Good symmetric motor tone is noted throughout.  Sensory: Sensory testing is intact to soft touch on all 4 extremities. No evidence of extinction is noted.  Coordination: Cerebellar testing reveals good finger-nose-finger and heel-to-shin bilaterally.  Gait and station: Gait is normal. Tandem gait is normal. Romberg is negative. No drift is seen.  Reflexes: Deep tendon reflexes are symmetric and normal bilaterally.     DIAGNOSTIC DATA (LABS, IMAGING, TESTING) - I reviewed patient records, labs, notes, testing and imaging myself where available.  Lab Results  Component Value Date   WBC 11.3 (H) 07/27/2020   HGB 11.6 (L) 07/27/2020   HCT 36.6 07/27/2020   MCV 69.4 Repeated and verified X2. (L) 07/27/2020   PLT 309.0 07/27/2020      Component Value Date/Time   NA 137 07/27/2020 1117   K 3.3 (L) 07/27/2020 1117   CL 100 07/27/2020 1117   CO2 29 07/27/2020 1117   GLUCOSE 103 (H) 07/27/2020 1117   BUN 11 07/27/2020 1117   CREATININE 0.79 07/27/2020 1117   CALCIUM 9.6 07/27/2020 1117   PROT 7.2 12/30/2019 1207   ALBUMIN 3.9 12/30/2019 1207   AST 15 12/30/2019 1207   ALT 13 12/30/2019 1207   ALKPHOS 59 12/30/2019 1207   BILITOT 0.4  12/30/2019 1207   Lab Results  Component Value Date   CHOL 212 (H) 12/30/2019   HDL 93.20 12/30/2019   LDLCALC 106 (H) 12/30/2019   TRIG 64.0 12/30/2019   CHOLHDL 2 12/30/2019   Lab Results  Component Value Date   HGBA1C 8.7 (H) 07/27/2020   No results found for: VITAMINB12 Lab Results  Component Value Date   TSH 0.78 12/30/2019      ASSESSMENT AND PLAN  74 y.o. year old female  has a past medical history of Allergy, Bronchiectasis ([place]), Diabetes mellitus without complication ([place]), and Hypertension. here with ***  No diagnosis found.   I spent 20 minutes of face-to-face and non-face-to-face time with patient.  This included previsit chart review, lab review, study review, order entry, electronic health record documentation, patient education.    Debbora Presto, MSN, FNP-C 08/23/2020, 1:57 PM  [facility] [address] [place], [place] [address] [phone]

## 2020-08-23 NOTE — Telephone Encounter (Signed)
Pt states her bowel movements are more normal now since stopping the metformin.  Pt blood sugars have been ranging as follows  Wed. [place] 139 Fri. 150 Sat. 150 Sun. 169 (ate dinner late the night before) Mon. 158

## 2020-08-23 NOTE — Telephone Encounter (Signed)
Continue to hold metformin and continue insulin as precribed

## 2020-08-24 ENCOUNTER — Ambulatory Visit: Payer: Self-pay | Admitting: *Deleted

## 2020-08-24 ENCOUNTER — Ambulatory Visit: Payer: HMO | Admitting: Family Medicine

## 2020-08-24 NOTE — Telephone Encounter (Signed)
LVM for patient to return call. 

## 2020-08-25 ENCOUNTER — Other Ambulatory Visit: Payer: Self-pay | Admitting: *Deleted

## 2020-08-25 NOTE — Telephone Encounter (Signed)
Unable to leave voicemail for pt.

## 2020-08-25 NOTE — Patient Outreach (Signed)
[facility]) Care Management  08/25/2020  Harrison Paulson 1946-04-03 [id number]   CSW has referred pt to the [facility] Care Management team for further support and assistance. CSW will sign off and make PCP, [facility] team aware of [facility]'s involvement and above.   Eduard Clos, MSW, Grand Worker  [place] [phone]

## 2020-08-26 DIAGNOSIS — R109 Unspecified abdominal pain: Secondary | ICD-10-CM | POA: Diagnosis not present

## 2020-08-26 DIAGNOSIS — C49A0 Gastrointestinal stromal tumor, unspecified site: Secondary | ICD-10-CM | POA: Diagnosis not present

## 2020-08-26 DIAGNOSIS — K219 Gastro-esophageal reflux disease without esophagitis: Secondary | ICD-10-CM | POA: Diagnosis not present

## 2020-08-26 DIAGNOSIS — Z86010 Personal history of colonic polyps: Secondary | ICD-10-CM | POA: Diagnosis not present

## 2020-08-30 ENCOUNTER — Ambulatory Visit (INDEPENDENT_AMBULATORY_CARE_PROVIDER_SITE_OTHER): Payer: HMO | Admitting: Nurse Practitioner

## 2020-08-30 ENCOUNTER — Encounter: Payer: Self-pay | Admitting: Nurse Practitioner

## 2020-08-30 ENCOUNTER — Other Ambulatory Visit: Payer: Self-pay

## 2020-08-30 VITALS — BP 120/60 | HR 88 | Temp 97.0°F | Ht 70.0 in | Wt 177.0 lb

## 2020-08-30 DIAGNOSIS — Z794 Long term (current) use of insulin: Secondary | ICD-10-CM

## 2020-08-30 DIAGNOSIS — E1169 Type 2 diabetes mellitus with other specified complication: Secondary | ICD-10-CM | POA: Diagnosis not present

## 2020-08-30 DIAGNOSIS — I10 Essential (primary) hypertension: Secondary | ICD-10-CM

## 2020-08-30 MED ORDER — NOVOLIN 70/30 FLEXPEN (70-30) 100 UNIT/ML ~~LOC~~ SUPN: PEN_INJECTOR | SUBCUTANEOUS | 11 refills | Status: DC

## 2020-08-30 MED ORDER — HYDROCHLOROTHIAZIDE 25 MG PO TABS: 25.0000 mg | ORAL_TABLET | Freq: Every day | ORAL | 1 refills | Status: DC

## 2020-08-30 MED ORDER — POTASSIUM CHLORIDE CRYS ER 20 MEQ PO TBCR: 20.0000 meq | EXTENDED_RELEASE_TABLET | Freq: Every day | ORAL | 1 refills | Status: DC

## 2020-08-30 NOTE — Assessment & Plan Note (Signed)
BP at goal. BP Readings from Last 3 Encounters:  08/30/20 120/60  05/27/20 110/60  04/27/20 128/70   Continue losartan and HCTZ F/up in 41months

## 2020-08-30 NOTE — Progress Notes (Signed)
Subjective:  Patient ID: Candice Pineda, female    DOB: 1945-12-06  Age: 74 y.o. MRN: [id number]  CC: Follow-up (3 month f/u on DM, HTN, and hyperlipidemia. Pt checks blood sugars at home and this morning it was 121. Pt is fasting this morning. )  HPI  Diabetes mellitus ([place]) Metformin discontinued due to severe diarrhea. Diarrhea resolved. Current use of novolin 70/30 15units BID. Denies any hypoglycemia Home glucose (fasting): 120-170. Last Hbg A1c of 8.7 (07/2020)  Increase insulin to 15units in Am and 20units in PM chrck glucose AM and PM Hold insulin with glucose<100 F/up in 60months  Essential hypertension BP at goal. BP Readings from Last 3 Encounters:  08/30/20 120/60  05/27/20 110/60  04/27/20 128/70   Continue losartan and HCTZ F/up in 84months  Reviewed past Medical, Social and Family history today.  Outpatient Medications Prior to Visit  Medication Sig Dispense Refill  . albuterol (VENTOLIN HFA) 108 (90 Base) MCG/ACT inhaler INHALE 2 PUFFS EVERY 4 HOURS AS NEEDED WHEEZING AND FOR SHORTNESS OF BREATH    . aspirin EC 81 MG tablet Take 81 mg by mouth daily.    Marland Kitchen azelastine (ASTELIN) 0.1 % nasal spray Place into the nose.    . Blood Glucose Calibration (ACCU-CHEK AVIVA) SOLN USE AS DIRECTED    . Blood Glucose Monitoring Suppl (ACCU-CHEK AVIVA) device Use daily to check blood sugar daily    . budesonide-formoterol (SYMBICORT) 80-4.5 MCG/ACT inhaler Inhale 2 puffs into the lungs 2 (two) times daily.    . calcium-vitamin D (OSCAL WITH D) 500-200 MG-UNIT tablet Take 1 tablet by mouth 2 (two) times daily.    . cetirizine (ZYRTEC) 10 MG tablet Take by mouth.    . Lancets (ONETOUCH ULTRASOFT) lancets 1 each by Other route in the morning and at bedtime. Use as instructed 200 each 3  . losartan (COZAAR) 25 MG tablet Take 1 tablet (25 mg total) by mouth daily. 90 tablet 3  . Multiple Vitamin (MULTI-VITAMIN DAILY PO) Take 1 tablet by mouth daily.    Marland Kitchen omeprazole (PRILOSEC) 40 MG  capsule Take 40 mg by mouth daily.    Glory Rosebush ULTRA test strip 1 each by Other route in the morning and at bedtime. Use as instructed 200 each 3  . pyridostigmine (MESTINON) 60 MG tablet Take 1 tablet (60 mg total) by mouth 3 (three) times daily. (Patient taking differently: Take 30 mg by mouth 3 (three) times daily.) 90 tablet 5  . simvastatin (ZOCOR) 40 MG tablet TAKE 1 TABLET(40 MG) BY MOUTH DAILY 30 tablet 3  . cholestyramine (QUESTRAN) 4 g packet Take 1 packet (4 g total) by mouth 2 (two) times daily. 60 each 0  . diphenoxylate-atropine (LOMOTIL) 2.5-0.025 MG tablet Take 1 tablet by mouth 3 (three) times daily as needed for diarrhea or loose stools. If >4loose stools per day 15 tablet 0  . hydrochlorothiazide (HYDRODIURIL) 25 MG tablet Take 1 tablet (25 mg total) by mouth daily. 90 tablet 1  . insulin isophane & regular human (NOVOLIN 70/30 FLEXPEN) (70-30) 100 UNIT/ML KwikPen Inject 15 Units into the skin 2 (two) times daily with a meal. 15 mL 11  . potassium chloride SA (KLOR-CON) 20 MEQ tablet Take 1 tablet (20 mEq total) by mouth daily. 90 tablet 1  . metFORMIN (GLUCOPHAGE) 500 MG tablet Take 1 tablet (500 mg total) by mouth 2 (two) times daily with a meal. (Patient not taking: Reported on 08/30/2020) 180 tablet 3   No facility-administered medications prior  to visit.    ROS See HPI  Objective:  BP 120/60 (BP Location: Left Arm, Patient Position: Sitting, Cuff Size: Normal)   Pulse 88   Temp (!) 97 F (36.1 C) (Temporal)   Ht 5\' 10"  (1.778 m)   Wt 177 lb (80.3 kg)   SpO2 100%   BMI 25.40 kg/m   Physical Exam Cardiovascular:     Rate and Rhythm: Normal rate.     Pulses: Normal pulses.  Pulmonary:     Effort: Pulmonary effort is normal.  Neurological:     Mental Status: She is alert and oriented to person, place, and time.    Assessment & Plan:  This visit occurred during the SARS-CoV-2 public health emergency.  Safety protocols were in place, including screening  questions prior to the visit, additional usage of staff PPE, and extensive cleaning of exam room while observing appropriate contact time as indicated for disinfecting solutions.   Pessy was seen today for follow-up.  Diagnoses and all orders for this visit:  Essential hypertension -     hydrochlorothiazide (HYDRODIURIL) 25 MG tablet; Take 1 tablet (25 mg total) by mouth daily. -     potassium chloride SA (KLOR-CON) 20 MEQ tablet; Take 1 tablet (20 mEq total) by mouth daily.  Type 2 diabetes mellitus with other specified complication, with long-term current use of insulin (HCC) -     insulin isophane & regular human (NOVOLIN 70/30 FLEXPEN) (70-30) 100 UNIT/ML KwikPen; Take 15unit in AM and 20units at bedtime   Problem List Items Addressed This Visit      Cardiovascular and Mediastinum   Essential hypertension - Primary    BP at goal. BP Readings from Last 3 Encounters:  08/30/20 120/60  05/27/20 110/60  04/27/20 128/70   Continue losartan and HCTZ F/up in 46months      Relevant Medications   hydrochlorothiazide (HYDRODIURIL) 25 MG tablet   potassium chloride SA (KLOR-CON) 20 MEQ tablet     Endocrine   Diabetes mellitus (Candice Pineda)    Metformin discontinued due to severe diarrhea. Diarrhea resolved. Current use of novolin 70/30 15units BID. Denies any hypoglycemia Home glucose (fasting): 120-170. Last Hbg A1c of 8.7 (07/2020)  Increase insulin to 15units in Am and 20units in PM chrck glucose AM and PM Hold insulin with glucose<100 F/up in 35months      Relevant Medications   insulin isophane & regular human (NOVOLIN 70/30 FLEXPEN) (70-30) 100 UNIT/ML KwikPen       Follow-up: Return in about 3 months (around 11/28/2020) for DM and HTN, hyperlipidemia (F2F, fasting), 1mins).  Candice Lacy, NP

## 2020-08-30 NOTE — Patient Instructions (Signed)
Continue to hold metformin Increase insulin to 15units in Am and 20units in PM chrck glucose AM and PM Hold insulin with glucose<100  vagisil or monistat.  You will be contacted once prolia is approved.

## 2020-08-30 NOTE — Telephone Encounter (Signed)
Discussed with patient in office today during her ov.

## 2020-08-30 NOTE — Assessment & Plan Note (Signed)
Metformin discontinued due to severe diarrhea. Diarrhea resolved. Current use of novolin 70/30 15units BID. Denies any hypoglycemia Home glucose (fasting): 120-170. Last Hbg A1c of 8.7 (07/2020)  Increase insulin to 15units in Am and 20units in PM chrck glucose AM and PM Hold insulin with glucose<100 F/up in 37months

## 2020-08-31 ENCOUNTER — Ambulatory Visit: Payer: HMO | Admitting: Neurology

## 2020-08-31 ENCOUNTER — Encounter: Payer: Self-pay | Admitting: Neurology

## 2020-08-31 VITALS — Ht 69.5 in | Wt 178.0 lb

## 2020-08-31 DIAGNOSIS — I951 Orthostatic hypotension: Secondary | ICD-10-CM | POA: Diagnosis not present

## 2020-08-31 DIAGNOSIS — R55 Syncope and collapse: Secondary | ICD-10-CM

## 2020-08-31 DIAGNOSIS — R42 Dizziness and giddiness: Secondary | ICD-10-CM

## 2020-08-31 NOTE — Progress Notes (Signed)
[facility]  PATIENT: Candice Pineda DOB: 23-Mar-1946  REFERRING DOCTOR OR PCP: [name], NP SOURCE: Patient, notes from primary care, lab reports  _________________________________   HISTORICAL  CHIEF COMPLAINT:  Chief Complaint  Patient presents with  . Syncope    Rm 12, 4 month FU, "I sometimes get a little dizzy, no falls"    HISTORY OF PRESENT ILLNESS:  Candice Pineda is a 74 y.o. woman with episodes of syncope and lightheadedness.  Update 08/31/2020: Since the last visit, the patient was started on pyridostigmine for orthostatic hypotension.   She is taking 60 mg pyridostigmine bid .  No episodes of syncope and far fewer episodes of lightheadedness upon standing.   Echo showed no significant issues.   The 04/27/2020 Echo showed 60-65% LVEF% and grade 1 diastolic dysfunction.   She denies GI side effects now but had some diarrheas a couple months ago.        The tremor in her hands is doing better as well.     She reports her HTN and DM are doing well.  She had a UTI 07/2020 and was treated with Keflex  From initial consultation She is a 74 y.o. woman with several episodes of syncope and many episodes of presyncope.   When she stands up, she often feels lightheaded and does worse as she begins walking.   All 3 episodes occurred about 1-2 minute after standing up.    Every episode went from lightheadedness to pre-syncope to syncope and witnesses say that she just slumped down.  She quickly regains consciousness but is lightheaded upon standing.  She went to the ED with one episode and her evaluation was normal.  She sometimes has some lightheadedness and never while lying down.   After laying sown, she notes she needs to sit a while before standing.    She has noted a fine tremor at times while holding something but not otherwise.    She denies weakness.   No SOB in general but she feels she breathes better in bed laying on her left side.    She denies  numbness.   She has urinary frequency/urgency.     She has essential hypertension and is on losartan 25 mg.  This was reduced recently.  She has DM and was on insulin but now is back off.   She has an Echocardiogram scheduled next week.     She snores a little bit has not woken up gasping/snorting.    She has some daytime sleepiness   REVIEW OF SYSTEMS: Constitutional: No fevers, chills, sweats, or change in appetite Eyes: No visual changes, double vision, eye pain Ear, nose and throat: No hearing loss, ear pain, nasal congestion, sore throat Cardiovascular: No chest pain, palpitations Respiratory: No shortness of breath at rest or with exertion.   No wheezes GastrointestinaI: No nausea, vomiting, diarrhea, abdominal pain, fecal incontinence Genitourinary: No dysuria, urinary retention or frequency.  No nocturia. Musculoskeletal: No neck pain, back pain Integumentary: No rash, pruritus, skin lesions Neurological: as above Psychiatric: No depression at this time.  No anxiety Endocrine: No palpitations, diaphoresis, change in appetite, change in weigh or increased thirst Hematologic/Lymphatic: No anemia, purpura, petechiae. Allergic/Immunologic: No itchy/runny eyes, nasal congestion, recent allergic reactions, rashes  ALLERGIES: Allergies  Allergen Reactions  . Metformin Hcl Diarrhea  . Montelukast     Other reaction(s): Other (See Comments) Dizziness Dizziness   . Tetanus Toxoids     Arm extremely painful, arm turned red and  swollen  . Alendronate Nausea Only    And loose stools. Patient states that she can and wants to continue this medication at this time. And loose stools. Patient states that she can and wants to continue this medication at this time.     HOME MEDICATIONS:  Current Outpatient Medications:  .  albuterol (VENTOLIN HFA) 108 (90 Base) MCG/ACT inhaler, INHALE 2 PUFFS EVERY 4 HOURS AS NEEDED WHEEZING AND FOR SHORTNESS OF BREATH, Disp: , Rfl:  .  aspirin EC  81 MG tablet, Take 81 mg by mouth daily., Disp: , Rfl:  .  azelastine (ASTELIN) 0.1 % nasal spray, Place into the nose., Disp: , Rfl:  .  Blood Glucose Calibration (ACCU-CHEK AVIVA) SOLN, USE AS DIRECTED, Disp: , Rfl:  .  Blood Glucose Monitoring Suppl (ACCU-CHEK AVIVA) device, Use daily to check blood sugar daily, Disp: , Rfl:  .  budesonide-formoterol (SYMBICORT) 80-4.5 MCG/ACT inhaler, Inhale 2 puffs into the lungs 2 (two) times daily., Disp: , Rfl:  .  calcium-vitamin D (OSCAL WITH D) 500-200 MG-UNIT tablet, Take 1 tablet by mouth 2 (two) times daily., Disp: , Rfl:  .  cetirizine (ZYRTEC) 10 MG tablet, Take by mouth., Disp: , Rfl:  .  hydrochlorothiazide (HYDRODIURIL) 25 MG tablet, Take 1 tablet (25 mg total) by mouth daily., Disp: 90 tablet, Rfl: 1 .  insulin isophane & regular human (NOVOLIN 70/30 FLEXPEN) (70-30) 100 UNIT/ML KwikPen, Take 15unit in AM and 20units at bedtime, Disp: 15 mL, Rfl: 11 .  Lancets (ONETOUCH ULTRASOFT) lancets, 1 each by Other route in the morning and at bedtime. Use as instructed, Disp: 200 each, Rfl: 3 .  losartan (COZAAR) 25 MG tablet, Take 1 tablet (25 mg total) by mouth daily., Disp: 90 tablet, Rfl: 3 .  Multiple Vitamin (MULTI-VITAMIN DAILY PO), Take 1 tablet by mouth daily., Disp: , Rfl:  .  omeprazole (PRILOSEC) 40 MG capsule, Take 40 mg by mouth daily., Disp: , Rfl:  .  ONETOUCH ULTRA test strip, 1 each by Other route in the morning and at bedtime. Use as instructed, Disp: 200 each, Rfl: 3 .  potassium chloride SA (KLOR-CON) 20 MEQ tablet, Take 1 tablet (20 mEq total) by mouth daily., Disp: 90 tablet, Rfl: 1 .  pyridostigmine (MESTINON) 60 MG tablet, Take 1 tablet (60 mg total) by mouth 3 (three) times daily. (Patient taking differently: Take 30 mg by mouth 3 (three) times daily.), Disp: 90 tablet, Rfl: 5 .  simvastatin (ZOCOR) 40 MG tablet, TAKE 1 TABLET(40 MG) BY MOUTH DAILY, Disp: 30 tablet, Rfl: 3  PAST MEDICAL HISTORY: Past Medical History:  Diagnosis  Date  . Allergy   . Bronchiectasis ([place])   . Diabetes mellitus without complication ([place])   . Hypertension     PAST SURGICAL HISTORY: Past Surgical History:  Procedure Laterality Date  . ABDOMINAL HYSTERECTOMY    . BREAST BIOPSY Right   . COLONOSCOPY  2021  . HAND SURGERY Right    cyst removed  . STOMACH SURGERY      FAMILY HISTORY: Family History  Problem Relation Age of Onset  . Hypertension Sister   . Hypertension Brother   . Stroke Daughter     SOCIAL HISTORY:  Social History   Socioeconomic History  . Marital status: Widowed    Spouse name: Not on file  . Number of children: Not on file  . Years of education: Not on file  . Highest education level: Not on file  Occupational History  .  Occupation: Retired  Tobacco Use  . Smoking status: Never Smoker  . Smokeless tobacco: Never Used  Vaping Use  . Vaping Use: Never used  Substance and Sexual Activity  . Alcohol use: Yes    Comment: occasional  . Drug use: Not Currently  . Sexual activity: Not Currently  Other Topics Concern  . Not on file  Social History Narrative   Right handed    Lives alone   Caffeine use: Coffee daily   Social Determinants of Health   Financial Resource Strain: Medium Risk  . Difficulty of Paying Living Expenses: Somewhat hard  Food Insecurity: No Food Insecurity  . Worried About Charity fundraiser in the Last Year: Never true  . Ran Out of Food in the Last Year: Never true  Transportation Needs: No Transportation Needs  . Lack of Transportation (Medical): No  . Lack of Transportation (Non-Medical): No  Physical Activity: Not on file  Stress: No Stress Concern Present  . Feeling of Stress : Not at all  Social Connections: Moderately Integrated  . Frequency of Communication with Friends and Family: Three times a week  . Frequency of Social Gatherings with Friends and Family: More than three times a week  . Attends Religious Services: More than 4 times per year  . Active  Member of Clubs or Organizations: Yes  . Attends Archivist Meetings: More than 4 times per year  . Marital Status: Widowed  Intimate Partner Violence: Not At Risk  . Fear of Current or Ex-Partner: No  . Emotionally Abused: No  . Physically Abused: No  . Sexually Abused: No     PHYSICAL EXAM  Vitals:   08/31/20 0743  Weight: 178 lb (80.7 kg)  Height: 5' 9.5" (1.765 m)     Body mass index is 25.91 kg/m.  Orthostatics: 129/63   88   Laying 120/61   91   Sitting 87/53     108  Standing 1 minute  General: The patient is well-developed and well-nourished and in no acute distress  HEENT:  Head is [place]/AT.  Sclera are anicteric.    Neck: No carotid bruits are noted.  The neck is nontender.  Cardiovascular: The heart has a regular rate and rhythm with a normal S1 and S2. There were no murmurs, gallops or rubs.    Skin: Extremities are without rash or  edema.   Neurologic Exam  Mental status: The patient is alert and oriented x 3 at the time of the examination. The patient has apparent normal recent and remote memory, with an apparently normal attention span and concentration ability.   Speech is normal.  Cranial nerves: Extraocular movements are full. Normal facial strength. No obvious hearing deficits are noted.  Motor:  Muscle bulk is normal.   Tone is normal. Strength is  5 / 5 in all 4 extremities.   Sensory: Sensory testing is intact to soft touch and vibration sensation in all 4 extremities  Coordination: Cerebellar testing reveals good finger-nose-finger bilaterally.  Gait and station: Station is normal.   Gait is mildly wide. Tandem is wide.   She has retropulsion. . Romberg is negative.      DIAGNOSTIC DATA (LABS, IMAGING, TESTING) - I reviewed patient records, labs, notes, testing and imaging myself where available.  Lab Results  Component Value Date   WBC 11.3 (H) 07/27/2020   HGB 11.6 (L) 07/27/2020   HCT 36.6 07/27/2020   MCV 69.4 Repeated  and verified X2. (L) 07/27/2020  PLT 309.0 07/27/2020      Component Value Date/Time   NA 137 07/27/2020 1117   K 3.3 (L) 07/27/2020 1117   CL 100 07/27/2020 1117   CO2 29 07/27/2020 1117   GLUCOSE 103 (H) 07/27/2020 1117   BUN 11 07/27/2020 1117   CREATININE 0.79 07/27/2020 1117   CALCIUM 9.6 07/27/2020 1117   PROT 7.2 12/30/2019 1207   ALBUMIN 3.9 12/30/2019 1207   AST 15 12/30/2019 1207   ALT 13 12/30/2019 1207   ALKPHOS 59 12/30/2019 1207   BILITOT 0.4 12/30/2019 1207   Lab Results  Component Value Date   CHOL 212 (H) 12/30/2019   HDL 93.20 12/30/2019   LDLCALC 106 (H) 12/30/2019   TRIG 64.0 12/30/2019   CHOLHDL 2 12/30/2019   Lab Results  Component Value Date   HGBA1C 8.7 (H) 07/27/2020   No results found for: VITAMINB12 Lab Results  Component Value Date   TSH 0.78 12/30/2019       ASSESSMENT AND PLAN  Syncope, unspecified syncope type  Orthostatic hypotension  Lightheaded    1.  She is still mildly orthostatic.   Increase pyridostigmine to 60 mg po tid.     If any spells of syncope or lightheadedness upon standing does not improve or worsens over time we could add fludrocortisone 0.1 mg (Echo is fine with just grade 1 diastolic dysfunction) 2.   Sleepiness has improved.  If it worsens would consider PSG as she also snores 3.    return as needed  Nyshawn Gowdy A. Felecia Shelling, MD, [facility] 81/38/8719, 5:97 AM Certified in Neurology, Clinical Neurophysiology, Sleep Medicine and Neuroimaging  [facility] [address] [place], [place] [address] [phone]

## 2020-09-15 ENCOUNTER — Other Ambulatory Visit: Payer: HMO

## 2020-09-21 DIAGNOSIS — R350 Frequency of micturition: Secondary | ICD-10-CM | POA: Diagnosis not present

## 2020-09-21 DIAGNOSIS — R310 Gross hematuria: Secondary | ICD-10-CM | POA: Diagnosis not present

## 2020-09-21 DIAGNOSIS — R351 Nocturia: Secondary | ICD-10-CM | POA: Diagnosis not present

## 2020-09-23 NOTE — Progress Notes (Signed)
Good news, this patient's insurance is being verified.  The process has started for pt.

## 2020-10-01 ENCOUNTER — Other Ambulatory Visit: Payer: Self-pay

## 2020-10-01 DIAGNOSIS — E1169 Type 2 diabetes mellitus with other specified complication: Secondary | ICD-10-CM

## 2020-10-01 DIAGNOSIS — Z794 Long term (current) use of insulin: Secondary | ICD-10-CM

## 2020-10-01 NOTE — Telephone Encounter (Signed)
I called pt to discuss scheduling a NV for her Prolia injection.  After going over the out of pocket cost with her, she asked was that the only option.  Pt explained that she could not afford the out of pocket cost.  Pt would like [name] to discuss a different option, if there is one.  Please advise.  CB# [phone]

## 2020-10-05 ENCOUNTER — Telehealth: Payer: Self-pay

## 2020-10-05 NOTE — Telephone Encounter (Signed)
She can use fosamax tabs

## 2020-10-05 NOTE — Telephone Encounter (Signed)
Patient calling for a refill on the test strips. Pt also wanted a call back in regards to prior message, Prolia.  Please advise.  [phone]

## 2020-10-06 NOTE — Telephone Encounter (Signed)
Pt called back and states she would like the generic brand of fosamax because her insurance informed her that it would more than likely be no cost. Pt also needs a refill on diabetic test strips.

## 2020-10-06 NOTE — Telephone Encounter (Signed)
Patient is returning a call to the nurse to update her on what she found out from her insurance company.

## 2020-10-06 NOTE — Telephone Encounter (Signed)
Pt states she will call her insurance to discuss the cost of both options and contact our office when she has made her decided which option she wants to go with.

## 2020-10-06 NOTE — Addendum Note (Signed)
Addended by: Renette Butters on: 10/06/2020 12:14 PM   Modules accepted: Orders

## 2020-10-07 ENCOUNTER — Other Ambulatory Visit: Payer: Self-pay | Admitting: Nurse Practitioner

## 2020-10-07 DIAGNOSIS — M810 Age-related osteoporosis without current pathological fracture: Secondary | ICD-10-CM

## 2020-10-07 MED ORDER — ALENDRONATE SODIUM 70 MG PO TABS: 70.0000 mg | ORAL_TABLET | ORAL | 11 refills | Status: DC

## 2020-10-07 MED ORDER — ONETOUCH ULTRA VI STRP: 1.0000 | ORAL_STRIP | Freq: Two times a day (BID) | 3 refills | Status: DC

## 2020-10-13 ENCOUNTER — Telehealth: Payer: Self-pay | Admitting: Nurse Practitioner

## 2020-10-13 DIAGNOSIS — N3010 Interstitial cystitis (chronic) without hematuria: Secondary | ICD-10-CM | POA: Diagnosis not present

## 2020-10-13 NOTE — Telephone Encounter (Signed)
I need copy of lab results

## 2020-10-13 NOTE — Telephone Encounter (Signed)
Patient is calling to speak to the nurse or her provider. She had an outpatient procedure done today and was told that her Potassium was low. They instructed her to call her PCP. Please call her back at [phone].

## 2020-10-13 NOTE — Telephone Encounter (Signed)
Pt would like to know if she should increase her potassium intake? Please advise

## 2020-10-15 NOTE — Telephone Encounter (Signed)
Pt states she will bring labs next week if weather allows.

## 2020-10-15 NOTE — Telephone Encounter (Signed)
Pt called back and informed me that she did not get any labs to bring with her to her appointment.  Pt explains that we can call the surgical center of [place], [address].  [phone].  Pt said to call and ask for her lab work.  Fax # [phone]

## 2020-10-18 NOTE — Telephone Encounter (Signed)
LVM with medical records requesting lab work.

## 2020-10-18 NOTE — Telephone Encounter (Signed)
Candice Pineda, medical records/Surgical center calling to inform nurse that pt did not have any blood work performed at the [facility].

## 2020-10-19 NOTE — Telephone Encounter (Signed)
Spoke with patient and she states she will call and find out because she knows she had blood work done.

## 2020-10-19 NOTE — Telephone Encounter (Signed)
Patient called back to let the office know that she's still trying to find out where she had her labs done. Please call her at  [phone] if you have any questions.

## 2020-10-22 NOTE — Telephone Encounter (Signed)
Pt informed per Baldo Ash [name]-NP to increase potassium to 60 meq (1 tab TID) daily x4 days and then resume 20 meq (1 tab) daily. Pt verbalized understanding.

## 2020-10-25 ENCOUNTER — Telehealth: Payer: Self-pay

## 2020-10-25 DIAGNOSIS — E1169 Type 2 diabetes mellitus with other specified complication: Secondary | ICD-10-CM

## 2020-10-25 MED ORDER — FREESTYLE LIBRE 14 DAY SENSOR MISC: 1.0000 [IU] | 11 refills | Status: DC

## 2020-10-25 MED ORDER — FREESTYLE LIBRE READER DEVI: 1.0000 [IU] | Freq: Once | 0 refills | Status: AC

## 2020-10-25 NOTE — Telephone Encounter (Signed)
Pt calling to request a rx for the [employer].  Pt would like a call back in regards to wither or not [name] will send it in to pharmacy.  Pt said that she was told that her insurance would pay for it.  Please advise.  [phone]

## 2020-10-26 DIAGNOSIS — R351 Nocturia: Secondary | ICD-10-CM | POA: Diagnosis not present

## 2020-10-26 DIAGNOSIS — R350 Frequency of micturition: Secondary | ICD-10-CM | POA: Diagnosis not present

## 2020-11-03 DIAGNOSIS — Z794 Long term (current) use of insulin: Secondary | ICD-10-CM | POA: Diagnosis not present

## 2020-11-03 DIAGNOSIS — E119 Type 2 diabetes mellitus without complications: Secondary | ICD-10-CM | POA: Diagnosis not present

## 2020-11-03 DIAGNOSIS — M2042 Other hammer toe(s) (acquired), left foot: Secondary | ICD-10-CM | POA: Diagnosis not present

## 2020-11-03 DIAGNOSIS — L603 Nail dystrophy: Secondary | ICD-10-CM | POA: Diagnosis not present

## 2020-11-03 DIAGNOSIS — M2041 Other hammer toe(s) (acquired), right foot: Secondary | ICD-10-CM | POA: Diagnosis not present

## 2020-11-04 ENCOUNTER — Other Ambulatory Visit: Payer: Self-pay

## 2020-11-04 ENCOUNTER — Telehealth: Payer: HMO | Admitting: Physician Assistant

## 2020-11-04 VITALS — BP 136/68 | HR 74

## 2020-11-04 DIAGNOSIS — J309 Allergic rhinitis, unspecified: Secondary | ICD-10-CM | POA: Diagnosis not present

## 2020-11-04 MED ORDER — FLUTICASONE PROPIONATE 50 MCG/ACT NA SUSP: 2.0000 | Freq: Every day | NASAL | 6 refills | Status: DC

## 2020-11-04 MED ORDER — CETIRIZINE HCL 10 MG PO TABS: 10.0000 mg | ORAL_TABLET | Freq: Every day | ORAL | 11 refills | Status: DC

## 2020-11-04 MED ORDER — OLOPATADINE HCL 0.1 % OP SOLN: 1.0000 [drp] | Freq: Two times a day (BID) | OPHTHALMIC | 0 refills | Status: AC

## 2020-11-04 NOTE — Progress Notes (Signed)
New Patient Office Visit  Subjective:  Patient ID: Candice Pineda, female    DOB: 1946-07-03  Age: 75 y.o. MRN: [id number]  CC: No chief complaint on file.   HPI Candice Pineda reports that she has been having fullness in her ears, itchy and watery eyes, sinus headaches and scratchy throat for the past couple of months.  Reports that she has tried over-the-counter eyedrops and washing her eyes with baby shampoo without relief.  Endorses that she previously took Zyrtec, has not taken this in the last couple of months.  Denies sick contacts, endorses [place] vaccines with booster in October 2021.     Past Medical History:  Diagnosis Date  . Allergy   . Bronchiectasis ([place])   . Diabetes mellitus without complication ([place])   . Hypertension     Past Surgical History:  Procedure Laterality Date  . ABDOMINAL HYSTERECTOMY    . BREAST BIOPSY Right   . COLONOSCOPY  2021  . HAND SURGERY Right    cyst removed  . STOMACH SURGERY      Family History  Problem Relation Age of Onset  . Hypertension Sister   . Hypertension Brother   . Stroke Daughter     Social History   Socioeconomic History  . Marital status: Widowed    Spouse name: Not on file  . Number of children: Not on file  . Years of education: Not on file  . Highest education level: Not on file  Occupational History  . Occupation: Retired  Tobacco Use  . Smoking status: Never Smoker  . Smokeless tobacco: Never Used  Vaping Use  . Vaping Use: Never used  Substance and Sexual Activity  . Alcohol use: Yes    Comment: occasional  . Drug use: Not Currently  . Sexual activity: Not Currently  Other Topics Concern  . Not on file  Social History Narrative   Right handed    Lives alone   Caffeine use: Coffee daily   Social Determinants of Health   Financial Resource Strain: Medium Risk  . Difficulty of Paying Living Expenses: Somewhat hard  Food Insecurity: Not on file  Transportation Needs: Not on file   Physical Activity: Not on file  Stress: No Stress Concern Present  . Feeling of Stress : Not at all  Social Connections: Moderately Integrated  . Frequency of Communication with Friends and Family: Three times a week  . Frequency of Social Gatherings with Friends and Family: More than three times a week  . Attends Religious Services: More than 4 times per year  . Active Member of Clubs or Organizations: Yes  . Attends Archivist Meetings: More than 4 times per year  . Marital Status: Widowed  Intimate Partner Violence: Not At Risk  . Fear of Current or Ex-Partner: No  . Emotionally Abused: No  . Physically Abused: No  . Sexually Abused: No    ROS Review of Systems  Constitutional: Negative for chills and fever.  HENT: Positive for congestion, postnasal drip and sinus pressure. Negative for sore throat.   Eyes: Positive for itching.  Respiratory: Negative for cough and shortness of breath.   Cardiovascular: Negative for chest pain.  Gastrointestinal: Negative for abdominal pain, nausea and vomiting.  Endocrine: Negative.   Genitourinary: Negative.   Musculoskeletal: Negative for myalgias.  Skin: Negative.   Allergic/Immunologic: Negative.   Neurological: Positive for headaches. Negative for dizziness.  Hematological: Negative.   Psychiatric/Behavioral: Negative.     Objective:  Today's Vitals: BP 136/68   Pulse 74   SpO2 98%   Physical Exam Vitals and nursing note reviewed.  Constitutional:      Appearance: Normal appearance.  HENT:     Head: Normocephalic and atraumatic.     Right Ear: Tympanic membrane, ear canal and external ear normal.     Left Ear: Tympanic membrane, ear canal and external ear normal.     Nose:     Right Turbinates: Swollen.     Left Turbinates: Swollen.     Right Sinus: Maxillary sinus tenderness present.     Left Sinus: Maxillary sinus tenderness present.     Mouth/Throat:     Lips: Pink.     Mouth: Mucous membranes are  moist.     Pharynx: Oropharynx is clear.     Comments: Drainage present Cardiovascular:     Rate and Rhythm: Normal rate and regular rhythm.     Pulses: Normal pulses.     Heart sounds: Normal heart sounds.  Pulmonary:     Effort: Pulmonary effort is normal.     Breath sounds: Normal breath sounds. No wheezing.  Musculoskeletal:        General: Normal range of motion.     Cervical back: Normal range of motion and neck supple.  Lymphadenopathy:     Cervical: No cervical adenopathy.  Skin:    General: Skin is warm and dry.  Neurological:     General: No focal deficit present.     Mental Status: She is alert and oriented to person, place, and time.  Psychiatric:        Mood and Affect: Mood normal.        Behavior: Behavior normal.        Thought Content: Thought content normal.        Judgment: Judgment normal.     Assessment & Plan:   Problem List Items Addressed This Visit      Respiratory   Allergic rhinitis - Primary   Relevant Medications   cetirizine (ZYRTEC) 10 MG tablet   olopatadine (PATANOL) 0.1 % ophthalmic solution   fluticasone (FLONASE) 50 MCG/ACT nasal spray      Outpatient Encounter Medications as of 11/04/2020  Medication Sig  . albuterol (VENTOLIN HFA) 108 (90 Base) MCG/ACT inhaler INHALE 2 PUFFS EVERY 4 HOURS AS NEEDED WHEEZING AND FOR SHORTNESS OF BREATH  . alendronate (FOSAMAX) 70 MG tablet Take 1 tablet (70 mg total) by mouth every 7 (seven) days. Take with a full glass of water on an empty stomach.  Marland Kitchen aspirin EC 81 MG tablet Take 81 mg by mouth daily.  Marland Kitchen azelastine (ASTELIN) 0.1 % nasal spray Place into the nose.  . budesonide-formoterol (SYMBICORT) 80-4.5 MCG/ACT inhaler Inhale 2 puffs into the lungs 2 (two) times daily.  . calcium-vitamin D (OSCAL WITH D) 500-200 MG-UNIT tablet Take 1 tablet by mouth 2 (two) times daily.  . fluticasone (FLONASE) 50 MCG/ACT nasal spray Place 2 sprays into both nostrils daily.  . hydrochlorothiazide (HYDRODIURIL)  25 MG tablet Take 1 tablet (25 mg total) by mouth daily.  . insulin isophane & regular human (NOVOLIN 70/30 FLEXPEN) (70-30) 100 UNIT/ML KwikPen Take 15unit in AM and 20units at bedtime  . losartan (COZAAR) 25 MG tablet Take 1 tablet (25 mg total) by mouth daily.  . Multiple Vitamin (MULTI-VITAMIN DAILY PO) Take 1 tablet by mouth daily.  Marland Kitchen olopatadine (PATANOL) 0.1 % ophthalmic solution Place 1 drop into both eyes 2 (two) times daily.  Marland Kitchen  omeprazole (PRILOSEC) 40 MG capsule Take 40 mg by mouth daily.  . potassium chloride SA (KLOR-CON) 20 MEQ tablet Take 1 tablet (20 mEq total) by mouth daily.  . [DISCONTINUED] pyridostigmine (MESTINON) 60 MG tablet Take 1 tablet (60 mg total) by mouth 3 (three) times daily. (Patient taking differently: Take 30 mg by mouth 3 (three) times daily.)  . [DISCONTINUED] simvastatin (ZOCOR) 40 MG tablet TAKE 1 TABLET(40 MG) BY MOUTH DAILY  . Blood Glucose Calibration (ACCU-CHEK AVIVA) SOLN USE AS DIRECTED  . Blood Glucose Monitoring Suppl (ACCU-CHEK AVIVA) device Use daily to check blood sugar daily  . cetirizine (ZYRTEC) 10 MG tablet Take 1 tablet (10 mg total) by mouth daily.  . Continuous Blood Gluc Sensor (FREESTYLE LIBRE 14 DAY SENSOR) MISC 1 Units by Does not apply route every 14 (fourteen) days.  . Lancets (ONETOUCH ULTRASOFT) lancets 1 each by Other route in the morning and at bedtime. Use as instructed  . ONETOUCH ULTRA test strip 1 each by Other route in the morning and at bedtime. Use as instructed  . [DISCONTINUED] cetirizine (ZYRTEC) 10 MG tablet Take by mouth. (Patient not taking: Reported on 11/04/2020)   No facility-administered encounter medications on file as of 11/04/2020.  1. Allergic rhinitis, unspecified seasonality, unspecified trigger Rapid Covid test negative.  Resume Zyrtec, trial Patanol, trial Flonase.  Patient education given on increasing hydration and rest.  Red flags given for prompt reevaluation. - cetirizine (ZYRTEC) 10 MG tablet; Take 1  tablet (10 mg total) by mouth daily.  Dispense: 30 tablet; Refill: 11 - olopatadine (PATANOL) 0.1 % ophthalmic solution; Place 1 drop into both eyes 2 (two) times daily.  Dispense: 3 mL; Refill: 0 - fluticasone (FLONASE) 50 MCG/ACT nasal spray; Place 2 sprays into both nostrils daily.  Dispense: 16 g; Refill: 6   I have reviewed the patient's medical history (PMH, PSH, Social History, Family History, Medications, and allergies) , and have been updated if relevant. I spent 30 minutes reviewing chart and  face to face time with patient.    Follow-up: Return if symptoms worsen or fail to improve.   Loraine Grip Taylin Leder, PA-C

## 2020-11-04 NOTE — Progress Notes (Signed)
Concerns with allergies Itchy, watery eyes from right eye.  Very worrisome at night.  Several months

## 2020-11-04 NOTE — Patient Instructions (Signed)
You will use Zyrtec on a daily basis.  I also sent over some allergy eyedrops as well as a nasal spray, I encourage you to use those until your symptoms resolved.  Make sure you get plenty of rest and drink plenty of water.  Please let us know if there is anything else we can do for you  Kennieth Rad, PA-C Physician Assistant [facility] [email or web address]    Allergic Rhinitis, Adult  Allergic rhinitis is an allergic reaction that affects the mucous membrane inside the nose. The mucous membrane is the tissue that produces mucus. There are two types of allergic rhinitis:  Seasonal. This type is also called hay fever and happens only during certain seasons.  Perennial. This type can happen at any time of the year. Allergic rhinitis cannot be spread from person to person. This condition can be mild, moderate, or severe. It can develop at any age and may be outgrown. What are the causes? This condition is caused by allergens. These are things that can cause an allergic reaction. Allergens may differ for seasonal allergic rhinitis and perennial allergic rhinitis.  Seasonal allergic rhinitis is triggered by pollen. Pollen can come from grasses, trees, and weeds.  Perennial allergic rhinitis may be triggered by: ? Dust mites. ? Proteins in a pet's urine, saliva, or dander. Dander is dead skin cells from a pet. ? Smoke, mold, or car fumes. What increases the risk? You are more likely to develop this condition if you have a family history of allergies or other conditions related to allergies, including:  Allergic conjunctivitis. This is inflammation of parts of the eyes and eyelids.  Asthma. This condition affects the lungs and makes it hard to breathe.  Atopic dermatitis or eczema. This is long term (chronic) inflammation of the skin.  Food allergies. What are the signs or symptoms? Symptoms of this condition  include:  Sneezing or coughing.  A stuffy nose (nasal congestion), itchy nose, or nasal discharge.  Itchy eyes and tearing of the eyes.  A feeling of mucus dripping down the back of your throat (postnasal drip).  Trouble sleeping.  Tiredness or fatigue.  Headache.  Sore throat. How is this diagnosed? This condition may be diagnosed with your symptoms, medical history, and physical exam. Your health care provider may check for related conditions, such as:  Asthma.  Pink eye. This is eye inflammation caused by infection (conjunctivitis).  Ear infection.  Upper respiratory infection. This is an infection in the nose, throat, or upper airways. You may also have tests to find out which allergens trigger your symptoms. These may include skin tests or blood tests. How is this treated? There is no cure for this condition, but treatment can help control symptoms. Treatment may include:  Taking medicines that block allergy symptoms, such as corticosteroids and antihistamines. Medicine may be given as a shot, nasal spray, or pill.  Avoiding any allergens.  Being exposed again and again to tiny amounts of allergens to help you build a defense against allergens (immunotherapy). This is done if other treatments have not helped. It may include: ? Allergy shots. These are injected medicines that have small amounts of allergen in them. ? Sublingual immunotherapy. This involves taking small doses of a medicine with allergen in it under your tongue. If these treatments do not work, your health care provider may prescribe newer, stronger medicines. Follow these instructions at home: Avoiding allergens Find out what you are allergic to and avoid those allergens. These  are some things you can do to help avoid allergens:  If you have perennial allergies: ? Replace carpet with wood, tile, or vinyl flooring. Carpet can trap dander and dust. ? Do not smoke. Do not allow smoking in your  home. ? Change your heating and air conditioning filters at least once a month.  If you have seasonal allergies, take these steps during allergy season: ? Keep windows closed as much as possible. ? Plan outdoor activities when pollen counts are lowest. Check pollen counts before you plan outdoor activities. ? When coming indoors, change clothing and shower before sitting on furniture or bedding.  If you have a pet in the house that produces allergens: ? Keep the pet out of the bedroom. ? Vacuum, sweep, and dust regularly. General instructions  Take over-the-counter and prescription medicines only as told by your health care provider.  Drink enough fluid to keep your urine pale yellow.  Keep all follow-up visits as told by your health care provider. This is important. Where to find more information  American Academy of Allergy, Asthma & Immunology: www.aaaai.org Contact a health care provider if:  You have a fever.  You develop a cough that does not go away.  You make whistling sounds when you breathe (wheeze).  Your symptoms slow you down or stop you from doing your normal activities each day. Get help right away if:  You have shortness of breath. This symptom may represent a serious problem that is an emergency. Do not wait to see if the symptom will go away. Get medical help right away. Call your local emergency services (911 in the [place] Do not drive yourself to the hospital. Summary  Allergic rhinitis may be managed by taking medicines as directed and avoiding allergens.  If you have seasonal allergies, keep windows closed as much as possible during allergy season.  Contact your health care provider if you develop a fever or a cough that does not go away. This information is not intended to replace advice given to you by your health care provider. Make sure you discuss any questions you have with your health care provider. Document Revised: 10/24/2019 Document Reviewed:  09/02/2019 Elsevier Patient Education  2021 [employer].

## 2020-11-05 ENCOUNTER — Other Ambulatory Visit: Payer: Self-pay | Admitting: Neurology

## 2020-11-05 ENCOUNTER — Telehealth: Payer: Self-pay | Admitting: Nurse Practitioner

## 2020-11-05 DIAGNOSIS — E7800 Pure hypercholesterolemia, unspecified: Secondary | ICD-10-CM

## 2020-11-05 MED ORDER — SIMVASTATIN 40 MG PO TABS: ORAL_TABLET | ORAL | 3 refills | Status: DC

## 2020-11-05 NOTE — Telephone Encounter (Signed)
Spoke with patient and she states she spoke with the pharmacist regarding her medication and they explained it to her. Pt states she does need a refill on her Simvastatin.

## 2020-11-05 NOTE — Telephone Encounter (Signed)
Patient is calling to speak to the nurse regarding her glucose machine. Please call her at [phone].

## 2020-11-09 LAB — POC COVID19 BINAXNOW: SARS Coronavirus 2 Ag: NEGATIVE

## 2020-11-09 NOTE — Addendum Note (Signed)
Addended by: Carilyn Goodpasture on: 11/09/2020 08:46 AM   Modules accepted: Orders

## 2020-11-30 ENCOUNTER — Other Ambulatory Visit: Payer: Self-pay | Admitting: Nurse Practitioner

## 2020-11-30 ENCOUNTER — Ambulatory Visit (INDEPENDENT_AMBULATORY_CARE_PROVIDER_SITE_OTHER): Payer: HMO | Admitting: Nurse Practitioner

## 2020-11-30 ENCOUNTER — Encounter: Payer: Self-pay | Admitting: Nurse Practitioner

## 2020-11-30 ENCOUNTER — Other Ambulatory Visit: Payer: Self-pay

## 2020-11-30 VITALS — BP 120/60 | HR 68 | Temp 97.0°F | Wt 176.2 lb

## 2020-11-30 DIAGNOSIS — E7800 Pure hypercholesterolemia, unspecified: Secondary | ICD-10-CM

## 2020-11-30 DIAGNOSIS — E876 Hypokalemia: Secondary | ICD-10-CM | POA: Diagnosis not present

## 2020-11-30 DIAGNOSIS — K219 Gastro-esophageal reflux disease without esophagitis: Secondary | ICD-10-CM

## 2020-11-30 DIAGNOSIS — I951 Orthostatic hypotension: Secondary | ICD-10-CM

## 2020-11-30 DIAGNOSIS — I952 Hypotension due to drugs: Secondary | ICD-10-CM

## 2020-11-30 DIAGNOSIS — I10 Essential (primary) hypertension: Secondary | ICD-10-CM

## 2020-11-30 DIAGNOSIS — R7989 Other specified abnormal findings of blood chemistry: Secondary | ICD-10-CM | POA: Diagnosis not present

## 2020-11-30 DIAGNOSIS — R700 Elevated erythrocyte sedimentation rate: Secondary | ICD-10-CM

## 2020-11-30 DIAGNOSIS — Z794 Long term (current) use of insulin: Secondary | ICD-10-CM

## 2020-11-30 DIAGNOSIS — E1169 Type 2 diabetes mellitus with other specified complication: Secondary | ICD-10-CM | POA: Diagnosis not present

## 2020-11-30 LAB — LIPID PANEL
Cholesterol: 150 mg/dL (ref 0–200)
HDL: 55.3 mg/dL (ref >39.00)
LDL Cholesterol: 82 mg/dL (ref 0–99)
NonHDL: 95.17
Total CHOL/HDL Ratio: 3
Triglycerides: 64 mg/dL (ref 0.0–149.0)
VLDL: 12.8 mg/dL (ref 0.0–40.0)

## 2020-11-30 LAB — BASIC METABOLIC PANEL
BUN: 15 mg/dL (ref 6–23)
CO2: 26 mEq/L (ref 19–32)
Calcium: 9.6 mg/dL (ref 8.4–10.5)
Chloride: 104 mEq/L (ref 96–112)
Creatinine, Ser: 0.75 mg/dL (ref 0.40–1.20)
GFR: 77.55 mL/min (ref >60.00)
Glucose, Bld: 91 mg/dL (ref 70–99)
Potassium: 4.1 mEq/L (ref 3.5–5.1)
Sodium: 140 mEq/L (ref 135–145)

## 2020-11-30 LAB — SEDIMENTATION RATE: Sed Rate: 19 mm/hr (ref 0–30)

## 2020-11-30 LAB — HEPATIC FUNCTION PANEL
ALT: 12 U/L (ref 0–35)
AST: 17 U/L (ref 0–37)
Albumin: 4.4 g/dL (ref 3.5–5.2)
Alkaline Phosphatase: 65 U/L (ref 39–117)
Bilirubin, Direct: 0.1 mg/dL (ref 0.0–0.3)
Total Bilirubin: 0.6 mg/dL (ref 0.2–1.2)
Total Protein: 7.6 g/dL (ref 6.0–8.3)

## 2020-11-30 LAB — HEMOGLOBIN A1C: Hgb A1c MFr Bld: 6.3 % (ref 4.6–6.5)

## 2020-11-30 LAB — FERRITIN: Ferritin: 47.1 ng/mL (ref 10.0–291.0)

## 2020-11-30 LAB — C-REACTIVE PROTEIN: CRP: <1 mg/dL (ref 0.5–20.0)

## 2020-11-30 MED ORDER — OMEPRAZOLE 20 MG PO CPDR: 20.0000 mg | DELAYED_RELEASE_CAPSULE | Freq: Two times a day (BID) | ORAL | 0 refills | Status: DC

## 2020-11-30 MED ORDER — POTASSIUM CHLORIDE CRYS ER 20 MEQ PO TBCR: 20.0000 meq | EXTENDED_RELEASE_TABLET | Freq: Every day | ORAL | 1 refills | Status: DC

## 2020-11-30 MED ORDER — HYDROCHLOROTHIAZIDE 25 MG PO TABS: 25.0000 mg | ORAL_TABLET | Freq: Every day | ORAL | 1 refills | Status: DC

## 2020-11-30 MED ORDER — LOSARTAN POTASSIUM 25 MG PO TABS: 25.0000 mg | ORAL_TABLET | Freq: Every day | ORAL | 3 refills | Status: DC

## 2020-11-30 MED ORDER — NOVOLIN 70/30 FLEXPEN (70-30) 100 UNIT/ML ~~LOC~~ SUPN: PEN_INJECTOR | SUBCUTANEOUS | 11 refills | Status: DC

## 2020-11-30 MED ORDER — SIMVASTATIN 40 MG PO TABS: ORAL_TABLET | ORAL | 3 refills | Status: DC

## 2020-11-30 MED ORDER — PYRIDOSTIGMINE BROMIDE 60 MG PO TABS: 60.0000 mg | ORAL_TABLET | Freq: Three times a day (TID) | ORAL | 1 refills | Status: DC

## 2020-11-30 NOTE — Assessment & Plan Note (Signed)
Completed echo: Normal EF and left ventricle wall thickness. mild impaired relaxation which is typically due to long standing HTN.   Intermittent frontal headache x 2weeks, worse with bending forward, Associated with sinus congestion, started zyrtec and flonase last week with minimal improvement  no syncope/fall, no vertigo, no nausea, no change in vision/hearing. No OTC pain medication use.  Advised to use tylenol OTC as directed on package. Consider head CT if symptoms persist

## 2020-11-30 NOTE — Assessment & Plan Note (Signed)
Resolved elevated CRP and sed rate

## 2020-11-30 NOTE — Assessment & Plan Note (Signed)
Stable BP BP Readings from Last 3 Encounters:  11/30/20 120/60  11/04/20 136/68  08/30/20 120/60   Repeat BMP: normal Continue medications

## 2020-11-30 NOTE — Assessment & Plan Note (Signed)
resolved 

## 2020-11-30 NOTE — Assessment & Plan Note (Signed)
LDL at goal with zocor Lipid Panel     Component Value Date/Time   CHOL 150 11/30/2020 1032   TRIG 64.0 11/30/2020 1032   HDL 55.30 11/30/2020 1032   CHOLHDL 3 11/30/2020 1032   VLDL 12.8 11/30/2020 1032   LDLCALC 82 11/30/2020 1032   Continue current medication

## 2020-11-30 NOTE — Patient Instructions (Signed)
Bring a copy of handicap form to be completed.  Go to lab for blood draw.  Try tylenol 325-500mg  every 8hrs as needed for headache.

## 2020-11-30 NOTE — Assessment & Plan Note (Signed)
Repeat HgbA1c: Normal with improved HgbA1c at 6.3. Decrease insulin to 10units in Am and 15units in PM Hold insulin if glucose <120 F/up in 92months

## 2020-11-30 NOTE — Progress Notes (Signed)
Subjective:  Patient ID: Candice Pineda, female    DOB: 1946-03-31  Age: 75 y.o. MRN: [id number]  CC: Follow-up (DM and HTN, medication refill)  HPI  Orthostatic hypotension Completed echo: Normal EF and left ventricle wall thickness. mild impaired relaxation which is typically due to long standing HTN.   Intermittent frontal headache x 2weeks, worse with bending forward, Associated with sinus congestion, started zyrtec and flonase last week with minimal improvement  no syncope/fall, no vertigo, no nausea, no change in vision/hearing. No OTC pain medication use.  Advised to use tylenol OTC as directed on package. Consider head CT if symptoms persist   Elevated sed rate Resolved elevated CRP and sed rate  Diabetes mellitus ([place]) Repeat HgbA1c: Normal with improved HgbA1c at 6.3. Decrease insulin to 10units in Am and 15units in PM Hold insulin if glucose <120 F/up in 16months  Essential hypertension Stable BP BP Readings from Last 3 Encounters:  11/30/20 120/60  11/04/20 136/68  08/30/20 120/60   Repeat BMP: normal Continue medications  Hypercholesterolemia LDL at goal with zocor Lipid Panel     Component Value Date/Time   CHOL 150 11/30/2020 1032   TRIG 64.0 11/30/2020 1032   HDL 55.30 11/30/2020 1032   CHOLHDL 3 11/30/2020 1032   VLDL 12.8 11/30/2020 1032   LDLCALC 82 11/30/2020 1032   Continue current medication  Hypokalemia resolved  BP Readings from Last 3 Encounters:  11/30/20 120/60  11/04/20 136/68  08/30/20 120/60   Wt Readings from Last 3 Encounters:  11/30/20 176 lb 3.2 oz (79.9 kg)  08/31/20 178 lb (80.7 kg)  08/30/20 177 lb (80.3 kg)   Reviewed past Medical, Social and Family history today.  Outpatient Medications Prior to Visit  Medication Sig Dispense Refill  . albuterol (VENTOLIN HFA) 108 (90 Base) MCG/ACT inhaler INHALE 2 PUFFS EVERY 4 HOURS AS NEEDED WHEEZING AND FOR SHORTNESS OF BREATH    . alendronate (FOSAMAX) 70 MG tablet Take 1  tablet (70 mg total) by mouth every 7 (seven) days. Take with a full glass of water on an empty stomach. 4 tablet 11  . aspirin EC 81 MG tablet Take 81 mg by mouth daily.    Marland Kitchen azelastine (ASTELIN) 0.1 % nasal spray Place into the nose.    . Blood Glucose Calibration (ACCU-CHEK AVIVA) SOLN USE AS DIRECTED    . Blood Glucose Monitoring Suppl (ACCU-CHEK AVIVA) device Use daily to check blood sugar daily    . budesonide-formoterol (SYMBICORT) 80-4.5 MCG/ACT inhaler Inhale 2 puffs into the lungs 2 (two) times daily.    . calcium-vitamin D (OSCAL WITH D) 500-200 MG-UNIT tablet Take 1 tablet by mouth 2 (two) times daily.    . cetirizine (ZYRTEC) 10 MG tablet Take 1 tablet (10 mg total) by mouth daily. 30 tablet 11  . Continuous Blood Gluc Sensor (FREESTYLE LIBRE 14 DAY SENSOR) MISC 1 Units by Does not apply route every 14 (fourteen) days. 2 each 11  . fluticasone (FLONASE) 50 MCG/ACT nasal spray Place 2 sprays into both nostrils daily. 16 g 6  . Lancets (ONETOUCH ULTRASOFT) lancets 1 each by Other route in the morning and at bedtime. Use as instructed 200 each 3  . mirabegron ER (MYRBETRIQ) 50 MG TB24 tablet Take 50 mg by mouth daily.    . Multiple Vitamin (MULTI-VITAMIN DAILY PO) Take 1 tablet by mouth daily.    Marland Kitchen olopatadine (PATANOL) 0.1 % ophthalmic solution Place 1 drop into both eyes 2 (two) times daily. 3 mL 0  .  ONETOUCH ULTRA test strip 1 each by Other route in the morning and at bedtime. Use as instructed 200 each 3  . hydrochlorothiazide (HYDRODIURIL) 25 MG tablet Take 1 tablet (25 mg total) by mouth daily. 90 tablet 1  . insulin isophane & regular human (NOVOLIN 70/30 FLEXPEN) (70-30) 100 UNIT/ML KwikPen Take 15unit in AM and 20units at bedtime 15 mL 11  . losartan (COZAAR) 25 MG tablet Take 1 tablet (25 mg total) by mouth daily. 90 tablet 3  . omeprazole (PRILOSEC) 40 MG capsule Take 40 mg by mouth daily.    . potassium chloride SA (KLOR-CON) 20 MEQ tablet Take 1 tablet (20 mEq total) by  mouth daily. 90 tablet 1  . pyridostigmine (MESTINON) 60 MG tablet TAKE 1 TABLET(60 MG) BY MOUTH THREE TIMES DAILY 90 tablet 0  . simvastatin (ZOCOR) 40 MG tablet TAKE 1 TABLET(40 MG) BY MOUTH DAILY 30 tablet 3   No facility-administered medications prior to visit.   ROS See HPI  Objective:  BP 120/60 (BP Location: Left Arm, Patient Position: Sitting, Cuff Size: Normal)   Pulse 68   Temp (!) 97 F (36.1 C) (Temporal)   Wt 176 lb 3.2 oz (79.9 kg)   SpO2 98%   BMI 25.65 kg/m   Physical Exam Cardiovascular:     Rate and Rhythm: Normal rate and regular rhythm.     Pulses: Normal pulses.     Heart sounds: Normal heart sounds.  Pulmonary:     Effort: Pulmonary effort is normal.     Breath sounds: Normal breath sounds.  Musculoskeletal:     Cervical back: Normal range of motion and neck supple.     Right lower leg: No edema.     Left lower leg: No edema.  Lymphadenopathy:     Cervical: No cervical adenopathy.  Neurological:     Mental Status: She is alert and oriented to person, place, and time.  Psychiatric:        Mood and Affect: Mood normal.        Behavior: Behavior normal.        Thought Content: Thought content normal.    Assessment & Plan:  This visit occurred during the SARS-CoV-2 public health emergency.  Safety protocols were in place, including screening questions prior to the visit, additional usage of staff PPE, and extensive cleaning of exam room while observing appropriate contact time as indicated for disinfecting solutions.   Candice Pineda was seen today for follow-up.  Diagnoses and all orders for this visit:  Essential hypertension -     Basic metabolic panel -     hydrochlorothiazide (HYDRODIURIL) 25 MG tablet; Take 1 tablet (25 mg total) by mouth daily. -     losartan (COZAAR) 25 MG tablet; Take 1 tablet (25 mg total) by mouth daily. -     potassium chloride SA (KLOR-CON) 20 MEQ tablet; Take 1 tablet (20 mEq total) by mouth daily.  Type 2 diabetes mellitus  with other specified complication, without long-term current use of insulin (HCC) -     Hemoglobin A1c  Hypokalemia -     Basic metabolic panel  Hypercholesterolemia -     Lipid panel -     simvastatin (ZOCOR) 40 MG tablet; TAKE 1 TABLET(40 MG) BY MOUTH DAILY  Elevated sed rate -     Sedimentation rate -     C-reactive protein  Elevated ferritin level -     Ferritin -     Hepatic function panel  Gastroesophageal  reflux disease without esophagitis -     omeprazole (PRILOSEC) 20 MG capsule; Take 1 capsule (20 mg total) by mouth in the morning and at bedtime.  Orthostatic hypotension -     pyridostigmine (MESTINON) 60 MG tablet; Take 1 tablet (60 mg total) by mouth 3 (three) times daily.  Type 2 diabetes mellitus with other specified complication, with long-term current use of insulin (HCC) -     insulin isophane & regular human (NOVOLIN 70/30 FLEXPEN) (70-30) 100 UNIT/ML KwikPen; Take 10unit in AM and 15units at bedtime  Hypotension due to drugs    Problem List Items Addressed This Visit      Cardiovascular and Mediastinum   Essential hypertension - Primary    Stable BP BP Readings from Last 3 Encounters:  11/30/20 120/60  11/04/20 136/68  08/30/20 120/60   Repeat BMP: normal Continue medications      Relevant Medications   hydrochlorothiazide (HYDRODIURIL) 25 MG tablet   losartan (COZAAR) 25 MG tablet   potassium chloride SA (KLOR-CON) 20 MEQ tablet   simvastatin (ZOCOR) 40 MG tablet   Other Relevant Orders   Basic metabolic panel (Completed)   Orthostatic hypotension    Completed echo: Normal EF and left ventricle wall thickness. mild impaired relaxation which is typically due to long standing HTN.   Intermittent frontal headache x 2weeks, worse with bending forward, Associated with sinus congestion, started zyrtec and flonase last week with minimal improvement  no syncope/fall, no vertigo, no nausea, no change in vision/hearing. No OTC pain medication  use.  Advised to use tylenol OTC as directed on package. Consider head CT if symptoms persist       Relevant Medications   hydrochlorothiazide (HYDRODIURIL) 25 MG tablet   losartan (COZAAR) 25 MG tablet   simvastatin (ZOCOR) 40 MG tablet   pyridostigmine (MESTINON) 60 MG tablet     Digestive   Esophageal reflux   Relevant Medications   omeprazole (PRILOSEC) 20 MG capsule     Endocrine   Diabetes mellitus ([place])    Repeat HgbA1c: Normal with improved HgbA1c at 6.3. Decrease insulin to 10units in Am and 15units in PM Hold insulin if glucose <120 F/up in 87months      Relevant Medications   insulin isophane & regular human (NOVOLIN 70/30 FLEXPEN) (70-30) 100 UNIT/ML KwikPen   losartan (COZAAR) 25 MG tablet   simvastatin (ZOCOR) 40 MG tablet   Other Relevant Orders   Hemoglobin A1c (Completed)     Other   Elevated sed rate    Resolved elevated CRP and sed rate      Relevant Orders   Sedimentation rate (Completed)   C-reactive protein (Completed)   Hypercholesterolemia    LDL at goal with zocor Lipid Panel     Component Value Date/Time   CHOL 150 11/30/2020 1032   TRIG 64.0 11/30/2020 1032   HDL 55.30 11/30/2020 1032   CHOLHDL 3 11/30/2020 1032   VLDL 12.8 11/30/2020 1032   LDLCALC 82 11/30/2020 1032   Continue current medication      Relevant Medications   hydrochlorothiazide (HYDRODIURIL) 25 MG tablet   losartan (COZAAR) 25 MG tablet   simvastatin (ZOCOR) 40 MG tablet   Other Relevant Orders   Lipid panel (Completed)   Hypokalemia    resolved      Relevant Orders   Basic metabolic panel (Completed)    Other Visit Diagnoses    Elevated ferritin level       Relevant Orders   Ferritin (Completed)  Hepatic function panel (Completed)   Hypotension due to drugs       Relevant Medications   hydrochlorothiazide (HYDRODIURIL) 25 MG tablet   losartan (COZAAR) 25 MG tablet   simvastatin (ZOCOR) 40 MG tablet      Follow-up: Return in about 6 months  (around 06/02/2021) for CPE (fatsing).  [name], NP

## 2020-12-02 ENCOUNTER — Telehealth: Payer: Self-pay | Admitting: Nurse Practitioner

## 2020-12-02 NOTE — Telephone Encounter (Signed)
Patient is calling to speak to her provider regarding her Zyrtec. She states that it's $21.19 and is OTC. She wants to know if she can be prescribed something that her insurance will pay for. Please call her at [phone] if you have any questions.

## 2020-12-05 ENCOUNTER — Other Ambulatory Visit: Payer: Self-pay | Admitting: Neurology

## 2020-12-05 DIAGNOSIS — I951 Orthostatic hypotension: Secondary | ICD-10-CM

## 2020-12-06 ENCOUNTER — Other Ambulatory Visit: Payer: Self-pay | Admitting: Nurse Practitioner

## 2020-12-06 NOTE — Telephone Encounter (Signed)
Patient notified and verbalized understanding. 

## 2020-12-13 ENCOUNTER — Telehealth: Payer: Self-pay | Admitting: Nurse Practitioner

## 2020-12-13 NOTE — Telephone Encounter (Signed)
Pt states she has to go to the pharmacy every time she needs the continuous glucose monitor changed, and would like to know if she can go back to just pricking her finger daily? Please advise.

## 2020-12-13 NOTE — Telephone Encounter (Signed)
Patient states that she wants to go back to the old way that she gave herself insulin. She said that the new way isn't working and it's too difficult for her. Please call her back at [phone].

## 2020-12-14 NOTE — Telephone Encounter (Signed)
Unable to leave voicemail due to mailbox not being set up on cell, unable to get a answer on home phone.

## 2020-12-14 NOTE — Telephone Encounter (Signed)
What are her glucose readings in last 1week?

## 2020-12-17 DIAGNOSIS — R140 Abdominal distension (gaseous): Secondary | ICD-10-CM | POA: Diagnosis not present

## 2020-12-17 DIAGNOSIS — R1013 Epigastric pain: Secondary | ICD-10-CM | POA: Diagnosis not present

## 2020-12-17 DIAGNOSIS — R1011 Right upper quadrant pain: Secondary | ICD-10-CM | POA: Diagnosis not present

## 2020-12-17 LAB — BASIC METABOLIC PANEL
BUN: 24 — AB (ref 4–21)
CO2: 33 — AB (ref 13–22)
Chloride: 102 (ref 99–108)
Creatinine: 1 (ref <=1.1)
Glucose: 164
Potassium: 3.6 (ref 3.4–5.3)
Sodium: 139 (ref 137–147)

## 2020-12-17 LAB — CBC AND DIFFERENTIAL
HCT: 36 (ref 36–46)
Hemoglobin: 11.6 — AB (ref 12.0–16.0)
Platelets: 279 (ref 150–399)
WBC: 9

## 2020-12-17 LAB — COMPREHENSIVE METABOLIC PANEL
Albumin: 3.7 (ref 3.5–5.0)
Calcium: 9.8 (ref 8.7–10.7)

## 2020-12-17 LAB — HEPATIC FUNCTION PANEL
ALT: 15 (ref 7–35)
AST: 19 (ref 13–35)

## 2020-12-17 LAB — CBC: RBC: 5.4 — AB (ref 3.87–5.11)

## 2020-12-27 ENCOUNTER — Telehealth: Payer: Self-pay | Admitting: Nurse Practitioner

## 2020-12-27 NOTE — Telephone Encounter (Signed)
Just an FYI: Patient is calling to let the office know that she went to an UC because of her acid reflux her medication was increased from 20mg  to 40mg .

## 2021-01-06 DIAGNOSIS — R140 Abdominal distension (gaseous): Secondary | ICD-10-CM | POA: Diagnosis not present

## 2021-01-06 DIAGNOSIS — R1010 Upper abdominal pain, unspecified: Secondary | ICD-10-CM | POA: Diagnosis not present

## 2021-01-06 DIAGNOSIS — K219 Gastro-esophageal reflux disease without esophagitis: Secondary | ICD-10-CM | POA: Diagnosis not present

## 2021-01-20 ENCOUNTER — Telehealth: Payer: Self-pay | Admitting: Nurse Practitioner

## 2021-01-20 NOTE — Progress Notes (Signed)
  Chronic Care Management   Note  01/20/2021 Name: Candice Pineda MRN: [id number] DOB: 12-07-1945  Candice Pineda is a 75 y.o. year old female who is a primary care patient of [name], [name], NP. I reached out to Valentina Gu by phone today in response to a referral sent by Ms. Carney Bern PCP, [name], [name], NP.   Ms. Ken was given information about Chronic Care Management services today including:  1. CCM service includes personalized support from designated clinical staff supervised by her physician, including individualized plan of care and coordination with other care providers 2. 24/7 contact phone numbers for assistance for urgent and routine care needs. 3. Service will only be billed when office clinical staff spend 20 minutes or more in a month to coordinate care. 4. Only one practitioner may furnish and bill the service in a calendar month. 5. The patient may stop CCM services at any time (effective at the end of the month) by phone call to the office staff.   Patient agreed to services and verbal consent obtained.   Follow up plan:   Carley Perdue [employer] Scheduler

## 2021-01-25 DIAGNOSIS — L02419 Cutaneous abscess of limb, unspecified: Secondary | ICD-10-CM | POA: Diagnosis not present

## 2021-01-26 ENCOUNTER — Other Ambulatory Visit: Payer: Self-pay | Admitting: Nurse Practitioner

## 2021-01-26 ENCOUNTER — Telehealth: Payer: Self-pay | Admitting: Nurse Practitioner

## 2021-01-26 DIAGNOSIS — Z794 Long term (current) use of insulin: Secondary | ICD-10-CM

## 2021-01-26 DIAGNOSIS — E1169 Type 2 diabetes mellitus with other specified complication: Secondary | ICD-10-CM

## 2021-01-26 NOTE — Telephone Encounter (Signed)
Patient is requesting referral for PT for shoulder pain. States her pain level is about a 5. Per her case manager at [facility] patient is not home bound so home health would not work and she can't afford the $30 co-pay for outpatient therapy. Patient would like to to discuss alternatives for PT. Please call her back at [phone].

## 2021-01-27 NOTE — Telephone Encounter (Signed)
Patient notified and verbalized understanding Pt states she would like you to know her shoulders are feeling a little better.

## 2021-01-27 NOTE — Telephone Encounter (Signed)
Any other suggestions for patient?

## 2021-01-27 NOTE — Telephone Encounter (Signed)
Unfortunately I do not have one at this time.

## 2021-01-31 DIAGNOSIS — Z794 Long term (current) use of insulin: Secondary | ICD-10-CM | POA: Diagnosis not present

## 2021-01-31 DIAGNOSIS — E119 Type 2 diabetes mellitus without complications: Secondary | ICD-10-CM | POA: Diagnosis not present

## 2021-01-31 DIAGNOSIS — B351 Tinea unguium: Secondary | ICD-10-CM | POA: Diagnosis not present

## 2021-01-31 DIAGNOSIS — R0602 Shortness of breath: Secondary | ICD-10-CM | POA: Diagnosis not present

## 2021-01-31 DIAGNOSIS — M2041 Other hammer toe(s) (acquired), right foot: Secondary | ICD-10-CM | POA: Diagnosis not present

## 2021-01-31 DIAGNOSIS — J479 Bronchiectasis, uncomplicated: Secondary | ICD-10-CM | POA: Diagnosis not present

## 2021-01-31 DIAGNOSIS — M2042 Other hammer toe(s) (acquired), left foot: Secondary | ICD-10-CM | POA: Diagnosis not present

## 2021-02-11 ENCOUNTER — Telehealth: Payer: Self-pay | Admitting: Nurse Practitioner

## 2021-02-11 DIAGNOSIS — I951 Orthostatic hypotension: Secondary | ICD-10-CM

## 2021-02-11 MED ORDER — PYRIDOSTIGMINE BROMIDE 60 MG PO TABS: 30.0000 mg | ORAL_TABLET | Freq: Three times a day (TID) | ORAL | 5 refills | Status: DC

## 2021-02-11 NOTE — Telephone Encounter (Signed)
Advised to decrease mestinon dose to 30mg  TID since resolve dizziness, daytime somnolence and no presyncopal episode

## 2021-02-16 DIAGNOSIS — J479 Bronchiectasis, uncomplicated: Secondary | ICD-10-CM | POA: Diagnosis not present

## 2021-02-17 ENCOUNTER — Other Ambulatory Visit: Payer: Self-pay

## 2021-02-18 ENCOUNTER — Ambulatory Visit (INDEPENDENT_AMBULATORY_CARE_PROVIDER_SITE_OTHER): Payer: HMO | Admitting: Nurse Practitioner

## 2021-02-18 ENCOUNTER — Encounter: Payer: Self-pay | Admitting: Nurse Practitioner

## 2021-02-18 VITALS — BP 122/60 | Temp 97.0°F | Ht 70.0 in | Wt 175.2 lb

## 2021-02-18 DIAGNOSIS — I10 Essential (primary) hypertension: Secondary | ICD-10-CM

## 2021-02-18 DIAGNOSIS — E1169 Type 2 diabetes mellitus with other specified complication: Secondary | ICD-10-CM | POA: Diagnosis not present

## 2021-02-18 DIAGNOSIS — I951 Orthostatic hypotension: Secondary | ICD-10-CM

## 2021-02-18 MED ORDER — METFORMIN HCL ER 500 MG PO TB24: 500.0000 mg | ORAL_TABLET | Freq: Two times a day (BID) | ORAL | 1 refills | Status: DC

## 2021-02-18 MED ORDER — HYDROCHLOROTHIAZIDE 25 MG PO TABS: 12.5000 mg | ORAL_TABLET | Freq: Every day | ORAL | 1 refills | Status: DC

## 2021-02-18 NOTE — Assessment & Plan Note (Signed)
Average glucose in last 30days: 200 AM glucose 190-200 PM glucose 200-220. No hypoglycemia. Metformin was discontinue in past due to diarrhea. We elevated glucose, she resumed rx last week. She denies any diarrhea or nausea or abd pain at this time.  hgbA1c of 6.3 11/2020.  Maintain humulin dose Add metformin 500mg  ER BID

## 2021-02-18 NOTE — Assessment & Plan Note (Signed)
Reviewed medication today Decrease HCTZ to 12.5mg  Maintain losartan dose. BP Readings from Last 3 Encounters:  02/18/21 122/60  11/30/20 120/60  11/04/20 136/68

## 2021-02-18 NOTE — Assessment & Plan Note (Signed)
Normal BP with decreased mestinon dose 30mg  TID Denies any daytime somnolence or dizziness or syncope.  Maintain current dose

## 2021-02-18 NOTE — Progress Notes (Signed)
Subjective:  Patient ID: Candice Pineda, female    DOB: [birth year]  Age: 75 y.o. MRN: [id number]  CC: Follow-up (F/u on HTN and DM, pt states she has not been checking her BP at home because she does not know how to work her BP machine that well. Pt did bring log of blood sugars. )  HPI  Orthostatic hypotension Normal BP with decreased mestinon dose 30mg  TID Denies any daytime somnolence or dizziness or syncope.  Maintain current dose  Essential hypertension Reviewed medication today Decrease HCTZ to 12.5mg  Maintain losartan dose. BP Readings from Last 3 Encounters:  02/18/21 122/60  11/30/20 120/60  11/04/20 136/68    Diabetes mellitus (HCC) Average glucose in last 30days: 200 AM glucose 190-200 PM glucose 200-220. No hypoglycemia. Metformin was discontinue in past due to diarrhea. We elevated glucose, she resumed rx last week. She denies any diarrhea or nausea or abd pain at this time.  hgbA1c of 6.3 11/2020.  Maintain humulin dose Add metformin 500mg  ER BID  AM: 191- 191, 246, 193, 335,   Average glucose in last 30days: 200 BP Readings from Last 3 Encounters:  02/18/21 122/60  11/30/20 120/60  11/04/20 136/68   Wt Readings from Last 3 Encounters:  02/18/21 175 lb 3.2 oz (79.5 kg)  11/30/20 176 lb 3.2 oz (79.9 kg)  08/31/20 178 lb (80.7 kg)   Reviewed past Medical, Social and Family history today.  Outpatient Medications Prior to Visit  Medication Sig Dispense Refill  . albuterol (VENTOLIN HFA) 108 (90 Base) MCG/ACT inhaler INHALE 2 PUFFS EVERY 4 HOURS AS NEEDED WHEEZING AND FOR SHORTNESS OF BREATH    . alendronate (FOSAMAX) 70 MG tablet Take 1 tablet (70 mg total) by mouth every 7 (seven) days. Take with a full glass of water on an empty stomach. 4 tablet 11  . aspirin EC 81 MG tablet Take 81 mg by mouth daily.    Marland Kitchen azelastine (ASTELIN) 0.1 % nasal spray Place into the nose.    . BD PEN NEEDLE NANO 2ND GEN 32G X 4 MM MISC USE TO INJECT INSULIN TWICE DAILY 200  each 1  . Blood Glucose Calibration (ACCU-CHEK AVIVA) SOLN USE AS DIRECTED    . Blood Glucose Monitoring Suppl (ACCU-CHEK AVIVA) device Use daily to check blood sugar daily    . budesonide-formoterol (SYMBICORT) 80-4.5 MCG/ACT inhaler Inhale 2 puffs into the lungs 2 (two) times daily.    . calcium-vitamin D (OSCAL WITH D) 500-200 MG-UNIT tablet Take 1 tablet by mouth 2 (two) times daily.    . cetirizine (ZYRTEC) 10 MG tablet Take 1 tablet (10 mg total) by mouth daily. 30 tablet 11  . Continuous Blood Gluc Sensor (FREESTYLE LIBRE 14 DAY SENSOR) MISC 1 Units by Does not apply route every 14 (fourteen) days. 2 each 11  . famotidine (PEPCID) 20 MG tablet Take 20 mg by mouth 2 (two) times daily.    . fluticasone (FLONASE) 50 MCG/ACT nasal spray Place 2 sprays into both nostrils daily. 16 g 6  . insulin isophane & regular human (NOVOLIN 70/30 FLEXPEN) (70-30) 100 UNIT/ML KwikPen Take 10unit in AM and 15units at bedtime 15 mL 11  . Lancets (ONETOUCH ULTRASOFT) lancets 1 each by Other route in the morning and at bedtime. Use as instructed 200 each 3  . losartan (COZAAR) 25 MG tablet Take 1 tablet (25 mg total) by mouth daily. 90 tablet 3  . mirabegron ER (MYRBETRIQ) 50 MG TB24 tablet Take 50 mg by mouth daily.    Marland Kitchen  Multiple Vitamin (MULTI-VITAMIN DAILY PO) Take 1 tablet by mouth daily.    Marland Kitchen omeprazole (PRILOSEC) 20 MG capsule Take 1 capsule (20 mg total) by mouth in the morning and at bedtime. 180 capsule 0  . ONETOUCH ULTRA test strip USE AS DIRECTED EVERY MORNING, AND AT BEDTIME 200 strip 0  . potassium chloride SA (KLOR-CON) 20 MEQ tablet Take 1 tablet (20 mEq total) by mouth daily. 90 tablet 1  . pyridostigmine (MESTINON) 60 MG tablet Take 0.5 tablets (30 mg total) by mouth 3 (three) times daily. 126 tablet 5  . simvastatin (ZOCOR) 40 MG tablet TAKE 1 TABLET(40 MG) BY MOUTH DAILY 90 tablet 3  . hydrochlorothiazide (HYDRODIURIL) 25 MG tablet Take 1 tablet (25 mg total) by mouth daily. 90 tablet 1    No facility-administered medications prior to visit.    ROS See HPI  Objective:  BP 122/60 (BP Location: Left Arm, Patient Position: Sitting, Cuff Size: Large)   Temp (!) 97 F (36.1 C) (Temporal)   Ht 5\' 10"  (1.778 m)   Wt 175 lb 3.2 oz (79.5 kg)   BMI 25.14 kg/m   Physical Exam Vitals reviewed.  Cardiovascular:     Rate and Rhythm: Normal rate.     Pulses: Normal pulses.  Pulmonary:     Effort: Pulmonary effort is normal.  Musculoskeletal:     Right lower leg: No edema.     Left lower leg: No edema.  Neurological:     Mental Status: She is alert and oriented to person, place, and time.     Assessment & Plan:  This visit occurred during the SARS-CoV-2 public health emergency.  Safety protocols were in place, including screening questions prior to the visit, additional usage of staff PPE, and extensive cleaning of exam room while observing appropriate contact time as indicated for disinfecting solutions.   Ak[place] was seen today for follow-up.  Diagnoses and all orders for this visit:  Type 2 diabetes mellitus with other specified complication, without long-term current use of insulin (HCC) -     metFORMIN (GLUCOPHAGE-XR) 500 MG 24 hr tablet; Take 1 tablet (500 mg total) by mouth 2 (two) times daily with a meal. -     AMB Referral to [facility]  Essential hypertension -     hydrochlorothiazide (HYDRODIURIL) 25 MG tablet; Take 0.5 tablets (12.5 mg total) by mouth daily. -     AMB Referral to [facility]  Orthostatic hypotension    Problem List Items Addressed This Visit      Cardiovascular and Mediastinum   Essential hypertension    Reviewed medication today Decrease HCTZ to 12.5mg  Maintain losartan dose. BP Readings from Last 3 Encounters:  02/18/21 122/60  11/30/20 120/60  11/04/20 136/68        Relevant Medications   hydrochlorothiazide (HYDRODIURIL) 25 MG tablet   Other Relevant Orders   AMB Referral to [place]   Orthostatic hypotension    Normal BP with decreased mestinon dose 30mg  TID Denies any daytime somnolence or dizziness or syncope.  Maintain current dose      Relevant Medications   hydrochlorothiazide (HYDRODIURIL) 25 MG tablet     Endocrine   Diabetes mellitus ([place]) - Primary    Average glucose in last 30days: 200 AM glucose 190-200 PM glucose 200-220. No hypoglycemia. Metformin was discontinue in past due to diarrhea. We elevated glucose, she resumed rx last week. She denies any diarrhea or nausea or abd pain at this time.  hgbA1c of 6.3 11/2020.  Maintain humulin dose Add metformin 500mg  ER BID       Relevant Medications   metFORMIN (GLUCOPHAGE-XR) 500 MG 24 hr tablet   Other Relevant Orders   AMB Referral to [facility]      Follow-up: Return in about 7 weeks (around 04/11/2021) for DM and HTN, hyperlipidemia (fasting)-40mins.  [name], NP

## 2021-02-18 NOTE — Patient Instructions (Signed)
Resume metformin 500mg  XR twice a day with meals Maintain insulin dose Continue to check glucose before breakfast and before supper.  Decrease hydrochlorothiazide to 12.5mg  (1/2tab) daily Maintain all other medication doses.

## 2021-02-25 ENCOUNTER — Other Ambulatory Visit: Payer: Self-pay | Admitting: Nurse Practitioner

## 2021-02-25 DIAGNOSIS — E1169 Type 2 diabetes mellitus with other specified complication: Secondary | ICD-10-CM

## 2021-02-25 DIAGNOSIS — K219 Gastro-esophageal reflux disease without esophagitis: Secondary | ICD-10-CM

## 2021-02-26 DIAGNOSIS — R21 Rash and other nonspecific skin eruption: Secondary | ICD-10-CM | POA: Diagnosis not present

## 2021-02-26 DIAGNOSIS — L710 Perioral dermatitis: Secondary | ICD-10-CM | POA: Diagnosis not present

## 2021-02-27 ENCOUNTER — Other Ambulatory Visit: Payer: Self-pay | Admitting: Nurse Practitioner

## 2021-02-27 DIAGNOSIS — K219 Gastro-esophageal reflux disease without esophagitis: Secondary | ICD-10-CM

## 2021-02-28 DIAGNOSIS — L2389 Allergic contact dermatitis due to other agents: Secondary | ICD-10-CM | POA: Diagnosis not present

## 2021-03-01 ENCOUNTER — Telehealth: Payer: Self-pay | Admitting: Nurse Practitioner

## 2021-03-01 ENCOUNTER — Telehealth: Payer: Self-pay

## 2021-03-01 NOTE — Telephone Encounter (Signed)
Patient states that she was told to call and let her provider know how her Dermatologist appointment went. Please give her a call back.

## 2021-03-01 NOTE — Progress Notes (Signed)
Chronic Care Management Pharmacy Assistant   Name: Candice Pineda  MRN: [id number] DOB: Jul 14, 1946  Chart Review  for clinical pharmacist on 03/07/2021.  Conditions to be addressed/monitored: HTN, HLD, Anxiety, and Carotid artery stenosis, Orthostatic hypotension, Allergic rhinitis,Esophageal reflux, Diabetes mellitus, Osteoporosis, Anemia, Urinary incontinence,   Primary concerns for visit include: Patient states she is concern about all her medications. Patient states she is "confuse about what I am taking ".  Patient states she went to the ED this past weekend for a rash.Patient report she  is concern because they did not bother to do any lab work or look further to figure out why she has a bad rash.Patient states her provider restart metformin 500 mg, but she does not think that cause a reaction.Patient denies diarrhea.  Patient reports her Pulmonologist diagnose her with bronchiectasis, and preform other test.Patient states her throat will feel "like it is closing up". Patient states she is unsure what is going on with her breathing when all her test came back normal. Patient states she is confuse on how to use her blood pressure machine.Informed patient she could bring her blood pressure monitor to her appointment with the clinical pharmacist to show her how to use it.Patient verbalized understanding and agreed to bring her blood pressure machine   Recent office visits:  02/18/2021 [name] NP (PCP)- restart metformin 500mg  ER BID, AMB Referral to [place] HCTZ to 12.5mg  (1/2tab) daily 02/11/2021 [name] NP (PCP)-decrease mestinon dose to 30mg  TID 12/27/2020 [name] NP (PCP)- Per noted Patient is calling to let the office know that she went to an UC because of her acid reflux her medication was increased from 20mg  to 40mg .  11/30/2020 [name] NP (PCP) - Decrease insulin to 10units in Am and 15units in PM Hold insulin if glucose  <120 10/13/2020 [name] NP (PCP) -  increase potassium to 60 meq (1 tab TID) daily x4 days and then resume 20 meq (1 tab) daily  Recent consult visits:  02/16/2021 Hendricks Milo RRT RCP (Pulmonology)  02/08/2021 Elijio Miles PA-C (Pulmonology)-No medication change noted 01/31/2021 Newton Pigg DPM (Podiatry) -No medication Change noted 01/31/2021 Elijio Miles PA-C (Pulmonology)- No medication Change noted 01/06/2021 Emilio Aspen PA (gastroenterology)- No medication change noted 11/04/2020 [name] PA-C [facility] 1) trial olopatadine (PATANOL) 0.1 % ophthalmic solution; Place 1 drop into both eyes 2 (two) times daily 11/03/2020 Newton Pigg DPM (Podiatry) No medication changes noted.  Hospital visits:  Medication Reconciliation was completed by comparing discharge summary, patient's EMR and Pharmacy list, and upon discussion with patient.  Admitted to the hospital on 02/26/2021 due to Perioral dermatitis; rash . Discharge date was 02/26/2021. Discharged from [place]?Medications Started at [facility] Discharge:?? -started None -referred to dermatology   Medication Changes at Hospital Discharge: -Changed None  Medications Discontinued at Hospital Discharge: -Stopped None   Medications that remain the same after Hospital Discharge:??  -All other medications will remain the same.    Admitted to the hospital on 01/25/2021 due to Axillary abscess. Discharge date was 01/25/2021. Discharged from [place] [facility] [place]?Medications Started at [facility] Discharge:?? -started cephalexin (KEFLEX) 500 MG capsule  1 capsule (500 mg total) by mouth 4 times daily for 10 days  Medication Changes at Hospital Discharge: -Changed N/A  Medications Discontinued at Hospital Discharge: -Stopped N/A  Medications that remain the same after Hospital Discharge:??  -All other  medications will remain  the same.    Admitted to the hospital on 12/17/2020 due to Epigastric pain. Discharge date was 12/17/2020. Discharged from [place] [facility] [place]?Medications Started at [facility] Discharge:?? -started N/A   Medication Changes at Hospital Discharge: -Changed N/A  Medications Discontinued at Hospital Discharge: -Stopped N/A  Medications that remain the same after Hospital Discharge:??  -All other medications will remain the same.    Have you seen any other providers since your last visit? Yes Patient states she had a appointment on 02/28/2021 with a dermatology.Patient reports she was started on Triamcinolone 0.1% ointment twice daily by her dermatologist. Patient reports she has little improvement, swelling decrease some but she is still itchy.  Any changes in your medications or health? no Any side effects from any medications? no Do you have an symptoms or problems not managed by your medications? no Any concerns about your health right now? Yes Patient states she is concern about all her medications. Patient states she is "confuse about what I am taking ".  Patient states she went to the ED this past weekend for a rash.Patient report she  is concern because they did not bother to do any lab work or look further to figure out why she has a bad rash.Patient states her provider restart metformin 500 mg, but she does not think that cause a reaction.Patient denies diarrhea.  Patient reports her Pulmonologist diagnose her with bronchiectasis, and preform other test.Patient states her throat will feel "like it is closing up". Patient states she is unsure what is going on with her breathing when all her test came back normal.  Has your provider asked that you check blood pressure, blood sugar, or follow special diet at home? Yes Patient states she does check her blood sugar at home and blood pressure when she remember. Patient states she is confuse on how to use  her blood pressure machine.Informed patient she could bring her blood pressure monitor to her appointment with the clinical pharmacist to show her how to use it.Patient verbalized understanding and agreed to bring her blood pressure machine. Patient reports her blood sugar will be good and then it will be high. 03/01/2021 it was 180. 02/28/2021 it was in the 300's. Do you get any type of exercise on a regular basis? No Patient states she Is active but is limited to exercising due to her breathing. Can you think of a goal you would like to reach for your health? None ID Do you have any problems getting your medications? No Patient reports she spends between 3 to 9 dollars. Is there anything that you would like to discuss during the appointment?  Patient states she is concern about all her medications. Patient states she is "confuse about what I am taking ".  Patient states she went to the ED this past weekend for a rash.Patient report she  is concern because they did not bother to do any lab work or look further to figure out why she has a bad rash.Patient states her provider restart metformin 500 mg, but she does not think that cause a reaction.Patient denies diarrhea.  Patient reports her Pulmonologist diagnose her with bronchiectasis, and preform other test.Patient states her throat will feel "like it is closing up". Patient states she is unsure what is going on with her breathing when all her test came back normal. Patient states she is confuse on how to use her blood pressure machine.Informed patient she  could bring her blood pressure monitor to her appointment with the clinical pharmacist to show her how to use it.Patient verbalized understanding and agreed to bring her blood pressure machine Please bring medications and supplements to appointment  Patient verbalized understanding Medications: Outpatient Encounter Medications as of 03/01/2021  Medication Sig   albuterol (VENTOLIN HFA) 108 (90  Base) MCG/ACT inhaler INHALE 2 PUFFS EVERY 4 HOURS AS NEEDED WHEEZING AND FOR SHORTNESS OF BREATH   alendronate (FOSAMAX) 70 MG tablet Take 1 tablet (70 mg total) by mouth every 7 (seven) days. Take with a full glass of water on an empty stomach.   aspirin EC 81 MG tablet Take 81 mg by mouth daily.   azelastine (ASTELIN) 0.1 % nasal spray Place into the nose.   BD PEN NEEDLE NANO 2ND GEN 32G X 4 MM MISC USE TO INJECT INSULIN TWICE DAILY   Blood Glucose Calibration (ACCU-CHEK AVIVA) SOLN USE AS DIRECTED   Blood Glucose Monitoring Suppl (ACCU-CHEK AVIVA) device Use daily to check blood sugar daily   budesonide-formoterol (SYMBICORT) 80-4.5 MCG/ACT inhaler Inhale 2 puffs into the lungs 2 (two) times daily.   calcium-vitamin D (OSCAL WITH D) 500-200 MG-UNIT tablet Take 1 tablet by mouth 2 (two) times daily.   cetirizine (ZYRTEC) 10 MG tablet Take 1 tablet (10 mg total) by mouth daily.   Continuous Blood Gluc Sensor (FREESTYLE LIBRE 14 DAY SENSOR) MISC 1 Units by Does not apply route every 14 (fourteen) days.   famotidine (PEPCID) 20 MG tablet Take 20 mg by mouth 2 (two) times daily.   fluticasone (FLONASE) 50 MCG/ACT nasal spray Place 2 sprays into both nostrils daily.   hydrochlorothiazide (HYDRODIURIL) 25 MG tablet Take 0.5 tablets (12.5 mg total) by mouth daily.   insulin isophane & regular human (NOVOLIN 70/30 FLEXPEN) (70-30) 100 UNIT/ML KwikPen Take 10unit in AM and 15units at bedtime   Lancets (ONETOUCH ULTRASOFT) lancets 1 each by Other route in the morning and at bedtime. Use as instructed   losartan (COZAAR) 25 MG tablet Take 1 tablet (25 mg total) by mouth daily.   metFORMIN (GLUCOPHAGE-XR) 500 MG 24 hr tablet Take 1 tablet (500 mg total) by mouth 2 (two) times daily with a meal.   mirabegron ER (MYRBETRIQ) 50 MG TB24 tablet Take 50 mg by mouth daily.   Multiple Vitamin (MULTI-VITAMIN DAILY PO) Take 1 tablet by mouth daily.   omeprazole (PRILOSEC) 20 MG capsule Take 1 capsule (20 mg total)  by mouth in the morning and at bedtime.   ONETOUCH ULTRA test strip USE AS DIRECTED EVERY MORNING, AND AT BEDTIME   potassium chloride SA (KLOR-CON) 20 MEQ tablet Take 1 tablet (20 mEq total) by mouth daily.   pyridostigmine (MESTINON) 60 MG tablet Take 0.5 tablets (30 mg total) by mouth 3 (three) times daily.   simvastatin (ZOCOR) 40 MG tablet TAKE 1 TABLET(40 MG) BY MOUTH DAILY   No facility-administered encounter medications on file as of 03/01/2021.    Star Rating Drugs: Metformin 500 mg last filled on 02/18/2021 for 90 day supply at [facility] 25 mg last filled on 12/27/2020 for 90 day supply at [facility].  Simvastatin 40 mg last filled on 01/24/2021 for 90 day supply at [place] Pharmacist Assistant [phone]

## 2021-03-02 DIAGNOSIS — H5201 Hypermetropia, right eye: Secondary | ICD-10-CM | POA: Diagnosis not present

## 2021-03-02 DIAGNOSIS — H43813 Vitreous degeneration, bilateral: Secondary | ICD-10-CM | POA: Diagnosis not present

## 2021-03-02 DIAGNOSIS — H35033 Hypertensive retinopathy, bilateral: Secondary | ICD-10-CM | POA: Diagnosis not present

## 2021-03-02 DIAGNOSIS — H11153 Pinguecula, bilateral: Secondary | ICD-10-CM | POA: Diagnosis not present

## 2021-03-02 DIAGNOSIS — H35363 Drusen (degenerative) of macula, bilateral: Secondary | ICD-10-CM | POA: Diagnosis not present

## 2021-03-02 DIAGNOSIS — L814 Other melanin hyperpigmentation: Secondary | ICD-10-CM | POA: Diagnosis not present

## 2021-03-02 DIAGNOSIS — Z794 Long term (current) use of insulin: Secondary | ICD-10-CM | POA: Diagnosis not present

## 2021-03-02 DIAGNOSIS — H52203 Unspecified astigmatism, bilateral: Secondary | ICD-10-CM | POA: Diagnosis not present

## 2021-03-02 DIAGNOSIS — H5212 Myopia, left eye: Secondary | ICD-10-CM | POA: Diagnosis not present

## 2021-03-02 DIAGNOSIS — H0288B Meibomian gland dysfunction left eye, upper and lower eyelids: Secondary | ICD-10-CM | POA: Diagnosis not present

## 2021-03-02 DIAGNOSIS — H0288A Meibomian gland dysfunction right eye, upper and lower eyelids: Secondary | ICD-10-CM | POA: Diagnosis not present

## 2021-03-02 DIAGNOSIS — H524 Presbyopia: Secondary | ICD-10-CM | POA: Diagnosis not present

## 2021-03-02 DIAGNOSIS — E113292 Type 2 diabetes mellitus with mild nonproliferative diabetic retinopathy without macular edema, left eye: Secondary | ICD-10-CM | POA: Diagnosis not present

## 2021-03-02 DIAGNOSIS — H2513 Age-related nuclear cataract, bilateral: Secondary | ICD-10-CM | POA: Diagnosis not present

## 2021-03-02 NOTE — Telephone Encounter (Signed)
Pt states at her dermatologist appointment they informed her it could be a skin lesion and gave her medication. Pt states she feels it is doing better and was instructed to follow up with PCP in 2 weeks if needed.

## 2021-03-07 ENCOUNTER — Ambulatory Visit (INDEPENDENT_AMBULATORY_CARE_PROVIDER_SITE_OTHER): Payer: HMO

## 2021-03-07 ENCOUNTER — Other Ambulatory Visit: Payer: Self-pay

## 2021-03-07 DIAGNOSIS — E785 Hyperlipidemia, unspecified: Secondary | ICD-10-CM | POA: Diagnosis not present

## 2021-03-07 DIAGNOSIS — M810 Age-related osteoporosis without current pathological fracture: Secondary | ICD-10-CM

## 2021-03-07 DIAGNOSIS — E113292 Type 2 diabetes mellitus with mild nonproliferative diabetic retinopathy without macular edema, left eye: Secondary | ICD-10-CM | POA: Diagnosis not present

## 2021-03-07 DIAGNOSIS — J471 Bronchiectasis with (acute) exacerbation: Secondary | ICD-10-CM | POA: Diagnosis not present

## 2021-03-07 DIAGNOSIS — E1159 Type 2 diabetes mellitus with other circulatory complications: Secondary | ICD-10-CM | POA: Diagnosis not present

## 2021-03-07 DIAGNOSIS — Z794 Long term (current) use of insulin: Secondary | ICD-10-CM

## 2021-03-07 DIAGNOSIS — E1169 Type 2 diabetes mellitus with other specified complication: Secondary | ICD-10-CM | POA: Diagnosis not present

## 2021-03-07 DIAGNOSIS — J309 Allergic rhinitis, unspecified: Secondary | ICD-10-CM

## 2021-03-07 DIAGNOSIS — I152 Hypertension secondary to endocrine disorders: Secondary | ICD-10-CM

## 2021-03-07 DIAGNOSIS — K219 Gastro-esophageal reflux disease without esophagitis: Secondary | ICD-10-CM

## 2021-03-07 MED ORDER — LANCET DEVICE MISC: 0 refills | Status: AC

## 2021-03-07 MED ORDER — OMEPRAZOLE 40 MG PO CPDR: 1.0000 | DELAYED_RELEASE_CAPSULE | Freq: Two times a day (BID) | ORAL | 0 refills | Status: DC

## 2021-03-07 NOTE — Patient Instructions (Addendum)
Visit Information It was great speaking with you today!  Please let me know if you have any questions about our visit.  Plan:  Take your insulin 15-30 minutes before meals  Start an over the counter allergy pill like cetirizine or loratidine Continue taking your vitamin D 2 capsules daily. Once you run out I will ask the pharmacy to switch you to Calcium + Vit D one tablet with breakfast and one tablet with supper    Goals Addressed             This Visit's Progress    Monitor and Manage My Blood Sugar-Diabetes Type 2       Timeframe:  Long-Range Goal Priority:  High Start Date: 03/08/2021                            Expected End Date: 09/07/2021                     Follow Up Date 05/03/2021    - check blood sugar at prescribed times - check blood sugar if I feel it is too high or too low - enter blood sugar readings and medication or insulin into daily log - take the blood sugar log to all doctor visits    Why is this important?   Checking your blood sugar at home helps to keep it from getting very high or very low.  Writing the results in a diary or log helps the doctor know how to care for you.  Your blood sugar log should have the time, date and the results.  Also, write down the amount of insulin or other medicine that you take.  Other information, like what you ate, exercise done and how you were feeling, will also be helpful.     Notes:          Patient Care Plan: General Pharmacy (Adult)     Problem Identified: Hypertension, Hyperlipidemia, Diabetes, GERD, Osteoporosis, Allergic Rhinitis, and bronchiectasis   Priority: High     Long-Range Goal: Patient-Specific Goal   Start Date: 03/08/2021  Expected End Date: 09/07/2021  This Visit's Progress: On track  Priority: High  Note:   Current Barriers:  Unable to independently afford treatment regimen Unable to achieve control of diabetes  Unable to self administer medications as prescribed  Pharmacist  Clinical Goal(s):  Patient will verbalize ability to afford treatment regimen achieve control of diabetes as evidenced by A1c less than 8% achieve ability to self administer medications as prescribed through use of [facility] as evidenced by patient report through collaboration with PharmD and provider.   Interventions: 1:1 collaboration with [name], [name], NP regarding development and update of comprehensive plan of care as evidenced by provider attestation and co-signature Inter-disciplinary care team collaboration (see longitudinal plan of care) Comprehensive medication review performed; medication list updated in electronic medical record  Hypertension (BP goal <140/90) -Controlled -Current treatment: Hydrochlorothiazide 25 mg 1/2 tablet daily  Losartan 25 mg daily Pyridostigmine 60 mg 1/2 tablet three times daily  -Medications previously tried: NA  -Current home readings: 110/57, 124/66   -Denies hypotensive symptoms since decreasing blood presusre medicaitons -Educated on Proper BP monitoring technique; Symptoms of hypotension and importance of maintaining adequate hydration; -Counseled to monitor BP at home 2-3 times weekly, document, and provide log at future appointments -Recommended to continue current medication  Hyperlipidemia: (LDL goal < 100) -Controlled -Current treatment: Simvastatin 40  mg daily  -Current antiplatelet treatment: Aspirin 81 mg daily  -Medications previously tried: NA  -Educated on Importance of limiting foods high in cholesterol; -Recommended to continue current medication  Diabetes (A1c goal <8%) -Not ideally controlled -Current medications: Metformin XR 500 mg twice daily  Novolin 70/30 10 units AM, 15 units PM  -Medications previously tried: NA  -Current home glucose readings  Fasting Evening  20-Jun 112   19-Jun 128 274  18-Jun 178   17-Jun 146 249  16-Jun 134 167  15-Jun 136 175  14-Jun 180 200   13-Jun 141 345  12-Jun 115 238  Average 141 235  -Denies hypoglycemic/hyperglycemic symptoms. -Fasting blood sugars much improved since restarting metformin. Patient has been taking for ~2 weeks and has been tolerating well. She continues to have prandial excursions in the afternoons/evenings. -Patient was previously using a freestyle libre CGM device, but was unable to open the packaging for the device on her own, so she returned to using her Onetouch device. She needs a new lancing device  -Educated on Carbohydrate counting and/or plate method -Counseled to check feet daily and get yearly eye exams -Recommended INCREASING metformin to 1000 mg twice daily   Bronchiectasis (Goal: control symptoms and prevent exacerbations) -Uncontrolled -Current treatment  Ventolin HFA 2 puffs every 4 hours as needed -Current allergic rhinitis treatment  Azelastine nasal spray -Medications previously tried: Symbicort  -Patient denies consistent use of maintenance inhaler -Frequency of rescue inhaler use: 2-3 times daily  -Patient with frequent post-nasal drip.  -Counseled on When to use rescue inhaler -Recommended restarting Symbicort  -Recommend restarting cetirizine 10 mg daily   Osteoporosis (Goal Prevent bone fractures) -Controlled -Patient is a candidate for pharmacologic treatment due to T-Score < -2.5 in femoral neck -Current treatment  Alendronate 70 mg weekly (started 10/07/20) Vitamin D 400 units 2 caps daily -Medications previously tried: NA  -Recommend 1200 mg of calcium daily from dietary and supplemental sources. -Recommended Starting Calcium + D supplement 1 tablet twice daily   GERD (Goal: Prevent heartburn) -Controlled -Current treatment  Omeprazole 40 mg twice daily  -Medications previously tried: NA  -Recommended to continue current medication  Overactive Bladder (Goal: NA) -Controlled -Current treatment  Mirabegron ER 50 mg daily  -Medications previously tried: NA   -Recommended to continue current medication   Patient Goals/Self-Care Activities Patient will:  - check glucose twice daily, document, and provide at future appointments check blood pressure 2-3 times weekly, document, and provide at future appointments  Follow Up Plan: Telephone follow up appointment with care management team member scheduled for:  05/03/2021 at 8:15 AM     Ms. Sommers was given information about Chronic Care Management services today including:  CCM service includes personalized support from designated clinical staff supervised by her physician, including individualized plan of care and coordination with other care providers 24/7 contact phone numbers for assistance for urgent and routine care needs. Standard insurance, coinsurance, copays and deductibles apply for chronic care management only during months in which we provide at least 20 minutes of these services. Most insurances cover these services at 100%, however patients may be responsible for any copay, coinsurance and/or deductible if applicable. This service may help you avoid the need for more expensive face-to-face services. Only one practitioner may furnish and bill the service in a calendar month. The patient may stop CCM services at any time (effective at the end of the month) by phone call to the office staff.  Patient agreed to services and verbal  consent obtained.   Print copy of patient instructions, educational materials, and care plan provided in person. Telephone follow up appointment with pharmacy team member scheduled for: 05/03/2021 at 8:15 AM  Junius Argyle, PharmD, CPP Clinical Pharmacist [facility]  [phone]

## 2021-03-07 NOTE — Progress Notes (Signed)
Chronic Care Management Pharmacy Note  03/08/2021 Name:  Candice Pineda MRN:  [id number] DOB:  1945-12-02  Summary: Patient has been tolerating metformin start and her morning readings have been much improved. Fasting AM Average: 141, Evening Average: 235. She is very concerned about her breathing as she is using her rescue inhaler multiple times daily with minimal relief. She was previously on Symbicort but unsure why it was not continued. She is also concerned about a rash on her body that has not been improving.   Recommendations/Changes made from today's visit: -Recommended INCREASING metformin to 1000 mg twice daily  -Recommended restarting cetirizine 10 mg daily  -Recommended Starting Calcium + D supplement 1 tablet twice daily   Plan: PCP follow-up tomorrow to address patient rash.  CPP follow-up in 2 months  Patient interested in [facility] services  Patient Assistance for [employer]   Subjective: Candice Pineda is an 75 y.o. year old female who is a primary patient of [name], [name], NP.  The CCM team was consulted for assistance with disease management and care coordination needs.    Engaged with patient face to face for initial visit in response to provider referral for pharmacy case management and/or care coordination services.   Consent to Services:  The patient was given the following information about Chronic Care Management services today, agreed to services, and gave verbal consent: 1. CCM service includes personalized support from designated clinical staff supervised by the primary care provider, including individualized plan of care and coordination with other care providers 2. 24/7 contact phone numbers for assistance for urgent and routine care needs. 3. Service will only be billed when office clinical staff spend 20 minutes or more in a month to coordinate care. 4. Only one practitioner may furnish and bill the service in a calendar month. 5.The patient may stop CCM  services at any time (effective at the end of the month) by phone call to the office staff. 6. The patient will be responsible for cost sharing (co-pay) of up to 20% of the service fee (after annual deductible is met). Patient agreed to services and consent obtained.  Patient Care Team: [name], [name], NP as PCP - General (Internal Medicine) [name], [facility] as Pharmacist (Pharmacist)  Recent office visits: 02/18/2021 [name] NP (PCP)- restart metformin 511m ER BID, AMB Referral to [facility] to 12.563m(1/2tab) daily 02/11/2021 ChWilfred LacyP (PCP)-decrease mestinon dose to 3049mID 12/27/2020 ChaWilfred Lacy (PCP)- Per noted Patient is calling to let the office know that she went to an UC because of her acid reflux her medication was increased from 36m29m 40mg36m3/15/2022 CharlWilfred LacyPCP) - Decrease insulin to 10units in Am and 15units in PM Hold insulin if glucose <120 10/13/2020 CharlWilfred LacyPCP) -  increase potassium to 60 meq (1 tab TID) daily x4 days and then resume 20 meq (1 tab) daily  Recent consult visits: 02/16/2021 [name] (Pulmonology) 02/08/2021 [name] (Pulmonology)-No medication change noted 01/31/2021 [name](Podiatry) -No medication Change noted 01/31/2021 [name] (Pulmonology)- No medication Change noted 01/06/2021 [name])- No medication change noted 11/04/2020 [name] [facility]C [facility]ad[facility] (PATANOL) 0.1 % ophthalmic solution; Place 1 drop into both eyes 2 (two) times daily 11/03/2020 [name](Podiatry) No medication changes noted.  Hospital visits: Admitted to the hospital on 02/26/2021 due to Perioral dermatitis; rash . Discharge date  was 02/26/2021. Discharged from [facility]?Medications Started at [facility] Discharge:?? -started  None -referred to dermatology    Medication Changes at Hospital Discharge: -Changed None   Medications Discontinued at Hospital Discharge: -Stopped None    Medications that remain the same after Hospital Discharge:?? -All other medications will remain the same.     Admitted to the hospital on 01/25/2021 due to Axillary abscess. Discharge date was 01/25/2021. Discharged from [facility] [place]?Medications Started at [facility] Discharge:?? -started cephalexin (KEFLEX) 500 MG capsule  1 capsule (500 mg total) by mouth 4 times daily for 10 days   Medication Changes at Hospital Discharge: -Changed N/A   Medications Discontinued at Hospital Discharge: -Stopped N/A   Medications that remain the same after Hospital Discharge:?? -All other medications will remain the same.     Admitted to the hospital on 12/17/2020 due to Epigastric pain. Discharge date was 12/17/2020. Discharged from [facility] [place]?Medications Started at [facility] Discharge:?? -started N/A    Medication Changes at Hospital Discharge: -Changed N/A   Medications Discontinued at Hospital Discharge: -Stopped N/A   Medications that remain the same after Hospital Discharge:?? -All other medications will remain the same.     Objective:  Lab Results  Component Value Date   CREATININE 1.0 12/17/2020   BUN 24 (A) 12/17/2020   GFR 77.55 11/30/2020   NA 139 12/17/2020   K 3.6 12/17/2020   CALCIUM 9.8 12/17/2020   CO2 33 (A) 12/17/2020   GLUCOSE 91 11/30/2020    Lab Results  Component Value Date/Time   HGBA1C 6.3 11/30/2020 10:32 AM   HGBA1C 8.7 (H) 07/27/2020 11:17 AM   GFR 77.55 11/30/2020 10:32 AM   GFR 73.03 07/27/2020 11:17 AM   MICROALBUR <0.7 12/30/2019 12:07 PM    Last diabetic Eye exam: No results found for: HMDIABEYEEXA  Last diabetic Foot exam: No results found for: HMDIABFOOTEX   Lab Results  Component Value Date    CHOL 150 11/30/2020   HDL 55.30 11/30/2020   LDLCALC 82 11/30/2020   TRIG 64.0 11/30/2020   CHOLHDL 3 11/30/2020    Hepatic Function Latest Ref Rng & Units 12/17/2020 11/30/2020 12/30/2019  Total Protein 6.0 - 8.3 g/dL - 7.6 7.2  Albumin 3.5 - 5.0 3.7 4.4 3.9  AST 13 - 35 19 17 15   ALT 7 - 35 15 12 13   Alk Phosphatase 39 - 117 U/L - 65 59  Total Bilirubin 0.2 - 1.2 mg/dL - 0.6 0.4  Bilirubin, Direct 0.0 - 0.3 mg/dL - 0.1 0.1    Lab Results  Component Value Date/Time   TSH 0.78 12/30/2019 12:07 PM    CBC Latest Ref Rng & Units 12/17/2020 07/27/2020 04/13/2020  WBC - 9.0 11.3(H) 9.5  Hemoglobin 12.0 - 16.0 11.6(A) 11.6(L) 11.5(L)  Hematocrit 36 - 46 36 36.6 36.4  Platelets 150 - 399 279 309.0 265.0    No results found for: VD25OH  Clinical ASCVD: No  The ASCVD Risk score Mikey Bussing DC Jr., et al., 2013) failed to calculate for the following reasons:   Unable to determine if patient is Non-Hispanic African American    Depression screen [facility] 2/9 03/25/2020 12/30/2019 11/04/2019  Decreased Interest 0 0 0  Down, Depressed, Hopeless 0 0 0  PHQ - 2 Score 0 0 0    Social History   Tobacco Use  Smoking  Status Never  Smokeless Tobacco Never   BP Readings from Last 3 Encounters:  02/18/21 122/60  11/30/20 120/60  11/04/20 136/68   Pulse Readings from Last 3 Encounters:  11/30/20 68  11/04/20 74  08/30/20 88   Wt Readings from Last 3 Encounters:  02/18/21 175 lb 3.2 oz (79.5 kg)  11/30/20 176 lb 3.2 oz (79.9 kg)  08/31/20 178 lb (80.7 kg)   BMI Readings from Last 3 Encounters:  02/18/21 25.14 kg/m  11/30/20 25.65 kg/m  08/31/20 25.91 kg/m   Last DEXA Scan: 06/29/20   T-Score femoral neck: -2.2  T-Score total hip: NA  T-Score lumbar spine: NA  T-Score forearm radius: -1.1  10-year probability of major osteoporotic fracture: 6.8%  10-year probability of hip fracture: 1.9%  Assessment/Interventions: Review of patient past medical history, allergies, medications, health  status, including review of consultants reports, laboratory and other test data, was performed as part of comprehensive evaluation and provision of chronic care management services.   SDOH:  (Social Determinants of Health) assessments and interventions performed: Yes SDOH Interventions    Flowsheet Row Most Recent Value  SDOH Interventions   Financial Strain Interventions Other (Comment)  [PAP]      SDOH Screenings   Alcohol Screen: Low Risk    Last Alcohol Screening Score (AUDIT): 0  Depression (PHQ2-9): Low Risk    PHQ-2 Score: 0  Financial Resource Strain: Medium Risk   Difficulty of Paying Living Expenses: Somewhat hard  Food Insecurity: Not on file  Housing: Low Risk    Last Housing Risk Score: 0  Physical Activity: Not on file  Social Connections: Moderately Integrated   Frequency of Communication with Friends and Family: Three times a week   Frequency of Social Gatherings with Friends and Family: More than three times a week   Attends Religious Services: More than 4 times per year   Active Member of Clubs or Organizations: Yes   Attends Archivist Meetings: More than 4 times per year   Marital Status: Widowed  Stress: No Stress Concern Present   Feeling of Stress : Not at all  Tobacco Use: Low Risk    Smoking Tobacco Use: Never   Smokeless Tobacco Use: Never  Transportation Needs: Not on file    CCM Care Plan  Allergies  Allergen Reactions   Metformin Hcl Diarrhea   Montelukast     Other reaction(s): Other (See Comments) Dizziness Dizziness    Tetanus Toxoids     Arm extremely painful, arm turned red and swollen   Alendronate Nausea Only    And loose stools. Patient states that she can and wants to continue this medication at this time. And loose stools. Patient states that she can and wants to continue this medication at this time.     Medications Reviewed Today     Reviewed by [name], [facility] (Pharmacist) on 03/07/21 at 1033  Med  List Status: <None>   Medication Order Taking? Sig Documenting Provider Last Dose Status Informant  albuterol (VENTOLIN HFA) 108 (90 Base) MCG/ACT inhaler [id number] Yes INHALE 2 PUFFS EVERY 4 HOURS AS NEEDED WHEEZING AND FOR SHORTNESS OF BREATH [provider] Taking Active   alendronate (FOSAMAX) 70 MG tablet [id number] Yes Take 1 tablet (70 mg total) by mouth every 7 (seven) days. Take with a full glass of water on an empty stomach. Flossie Buffy, NP Taking Active   aspirin EC 81 MG tablet [id number] Yes Take 81 mg by mouth daily. [provider] Taking Active Self  azelastine (ASTELIN) 0.1 % nasal spray [id number]  Place 2 sprays into both nostrils 2 (two) times daily. [provider]  Active   BD PEN NEEDLE NANO 2ND GEN 32G X 4 MM MISC [id number]  USE TO INJECT INSULIN TWICE DAILY [name], [name], NP  Active   Blood Glucose Calibration (ACCU-CHEK AVIVA) SOLN [id number]  USE AS DIRECTED [provider]  Active   Blood Glucose Monitoring Suppl (ACCU-CHEK AVIVA) device [id number]  Use daily to check blood sugar daily [provider]  Active   hydrochlorothiazide (HYDRODIURIL) 25 MG tablet [id number] Yes Take 0.5 tablets (12.5 mg total) by mouth daily. [name], [name], NP Taking Active   insulin isophane & regular human (NOVOLIN 70/30 FLEXPEN) (70-30) 100 UNIT/ML KwikPen [id number] Yes Take 10unit in AM and 15units at bedtime [name], [name], NP Taking Active   Lancets [facility] ULTRASOFT) lancets [id number]  1 each by Other route in the morning and at bedtime. Use as instructed [name], [name], NP  Active   losartan (COZAAR) 25 MG tablet [id number]  Take 1 tablet (25 mg total) by mouth daily. [name], [name], NP  Active   metFORMIN (GLUCOPHAGE-XR) 500 MG 24 hr tablet [id number]  Take 1 tablet (500 mg total) by mouth 2 (two) times daily with a meal. [name], [name], NP  Active   mirabegron ER (MYRBETRIQ) 50 MG TB24 tablet [id number] Yes  Take 50 mg by mouth daily. [provider] Taking Active   Multiple Vitamin (MULTI-VITAMIN DAILY PO) [id number] Yes Take 1 tablet by mouth daily. [provider] Taking Active Self  omeprazole (PRILOSEC) 20 MG capsule [id number] Yes Take 1 capsule (20 mg total) by mouth in the morning and at bedtime.  Patient taking differently: Take 40 mg by mouth in the morning and at bedtime.   Flossie Buffy, NP Taking Active   [facility] ULTRA test strip [id number]  USE AS DIRECTED EVERY MORNING, AND AT BEDTIME [name], [name], NP  Active   potassium chloride SA (KLOR-CON) 20 MEQ tablet [id number] Yes Take 1 tablet (20 mEq total) by mouth daily. [name], [name], NP Taking Active   pyridostigmine (MESTINON) 60 MG tablet [id number] Yes Take 0.5 tablets (30 mg total) by mouth 3 (three) times daily. Flossie Buffy, NP Taking Active   simvastatin (ZOCOR) 40 MG tablet [id number] Yes TAKE 1 TABLET(40 MG) BY MOUTH DAILY [name], [name], NP Taking Active   Vitamin D, Cholecalciferol, 10 MCG (400 UNIT) CAPS [id number] Yes Take 2 capsules by mouth daily. [provider] Taking Active             Patient Active Problem List   Diagnosis Date Noted   Elevated sed rate 04/27/2020   Syncope 04/21/2020   Orthostatic hypotension 04/21/2020   Excessive daytime sleepiness 04/21/2020   Lightheaded 04/13/2020   Hammertoes of both feet 05/29/2019   Astigmatism with presbyopia, bilateral 11/15/2018   Diabetes mellitus ([place]) 12/06/2016   Coccygeal pain, chronic 06/15/2016   Osteoporosis 02/23/2016   Elevated ferritin 02/01/2016   Age-related nuclear cataract of both eyes 01/22/2016   Mounier-Kuhn bronchiectasis with acute exacerbation ([place]) 10/21/2015   Breast mass, right 09/14/2015   Renal mass 09/14/2015   Gastrointestinal stromal tumor (GIST) ([place]) 08/26/2015   History of esophageal dilatation 05/25/2015   PVD (posterior vitreous detachment), right eye 02/04/2015   Vitreous  floaters of both eyes 02/04/2015   Diverticulosis of large intestine without hemorrhage 01/11/2015   Essential hypertension  09/21/2014   Hypokalemia 05/15/2014   Allergic rhinitis 01/23/2014   Carotid artery stenosis, asymptomatic 01/23/2014   Hypercholesterolemia 01/23/2014   Anemia 01/23/2014   Atopic dermatitis 01/23/2014   Constipation, chronic 01/23/2014   Esophageal reflux 06/19/2013   Background diabetic retinopathy ([place]) 02/22/2013   Generalized anxiety disorder 02/22/2013   Obesity 02/22/2013   Hearing loss 02/22/2013   Urinary incontinence 02/22/2013    Immunization History  Administered Date(s) Administered   Influenza, High Dose Seasonal PF 05/26/2015   Influenza,inj,Quad PF,6+ Mos 07/03/2017, 05/23/2019   PFIZER(Purple Top)SARS-COV-2 Vaccination 11/17/2019, 12/15/2019, 07/02/2020   Pneumococcal Conjugate-13 01/12/2015, 01/12/2015   Pneumococcal Polysaccharide-23 08/20/2012, 08/20/2012   Zoster, Live 09/18/2010, 09/18/2010    Conditions to be addressed/monitored:  Hypertension, Hyperlipidemia, Diabetes, GERD, Osteoporosis, Allergic Rhinitis, and bronchiectasis  Care Plan : General Pharmacy (Adult)  Updates made by [name], RPH since 03/08/2021 12:00 AM     Problem: Hypertension, Hyperlipidemia, Diabetes, GERD, Osteoporosis, Allergic Rhinitis, and bronchiectasis   Priority: High     Long-Range Goal: Patient-Specific Goal   Start Date: 03/08/2021  Expected End Date: 09/07/2021  This Visit's Progress: On track  Priority: High  Note:   Current Barriers:  Unable to independently afford treatment regimen Unable to achieve control of diabetes  Unable to self administer medications as prescribed  Pharmacist Clinical Goal(s):  Patient will verbalize ability to afford treatment regimen achieve control of diabetes as evidenced by A1c less than 8% achieve ability to self administer medications as prescribed through use of [facility]  Services as evidenced by patient report through collaboration with PharmD and provider.   Interventions: 1:1 collaboration with [name], [name], NP regarding development and update of comprehensive plan of care as evidenced by provider attestation and co-signature Inter-disciplinary care team collaboration (see longitudinal plan of care) Comprehensive medication review performed; medication list updated in electronic medical record  Hypertension (BP goal <140/90) -Controlled -Current treatment: Hydrochlorothiazide 25 mg 1/2 tablet daily  Losartan 25 mg daily Pyridostigmine 60 mg 1/2 tablet three times daily  -Medications previously tried: NA  -Current home readings: 110/57, 124/66   -Denies hypotensive symptoms since decreasing blood presusre medicaitons -Educated on Proper BP monitoring technique; Symptoms of hypotension and importance of maintaining adequate hydration; -Counseled to monitor BP at home 2-3 times weekly, document, and provide log at future appointments -Recommended to continue current medication  Hyperlipidemia: (LDL goal < 100) -Controlled -Current treatment: Simvastatin 40 mg daily  -Current antiplatelet treatment: Aspirin 81 mg daily  -Medications previously tried: NA  -Educated on Importance of limiting foods high in cholesterol; -Recommended to continue current medication  Diabetes (A1c goal <8%) -Not ideally controlled -Current medications: Metformin XR 500 mg twice daily  Novolin 70/30 10 units AM, 15 units PM  -Medications previously tried: NA  -Current home glucose readings  Fasting Evening  20-Jun 112   19-Jun 128 274  18-Jun 178   17-Jun 146 249  16-Jun 134 167  15-Jun 136 175  14-Jun 180 200  13-Jun 141 345  12-Jun 115 238  Average 141 235  -Denies hypoglycemic/hyperglycemic symptoms. -Fasting blood sugars much improved since restarting metformin. Patient has been taking for ~2 weeks and has been tolerating well. She continues to have  prandial excursions in the afternoons/evenings. -Patient was previously using a freestyle libre CGM device, but was unable to open the packaging for the device on her own, so she returned to using her Onetouch device. She needs a new lancing device  -Educated on Carbohydrate counting  and/or plate method -Counseled to check feet daily and get yearly eye exams -Recommended INCREASING metformin to 1000 mg twice daily   Bronchiectasis (Goal: control symptoms and prevent exacerbations) -Uncontrolled -Current treatment  Ventolin HFA 2 puffs every 4 hours as needed -Current allergic rhinitis treatment  Azelastine nasal spray -Medications previously tried: Symbicort  -Patient denies consistent use of maintenance inhaler -Frequency of rescue inhaler use: 2-3 times daily  -Patient with frequent post-nasal drip.  -Counseled on When to use rescue inhaler -Recommended restarting Symbicort  -Recommend restarting cetirizine 10 mg daily   Osteoporosis (Goal Prevent bone fractures) -Controlled -Patient is a candidate for pharmacologic treatment due to T-Score < -2.5 in femoral neck -Current treatment  Alendronate 70 mg weekly (started 10/07/20) Vitamin D 400 units 2 caps daily -Medications previously tried: NA  -Recommend 1200 mg of calcium daily from dietary and supplemental sources. -Recommended Starting Calcium + D supplement 1 tablet twice daily   GERD (Goal: Prevent heartburn) -Controlled -Current treatment  Omeprazole 40 mg twice daily  -Medications previously tried: NA  -Recommended to continue current medication  Overactive Bladder (Goal: NA) -Controlled -Current treatment  Mirabegron ER 50 mg daily  -Medications previously tried: NA  -Recommended to continue current medication   Patient Goals/Self-Care Activities Patient will:  - check glucose twice daily, document, and provide at future appointments check blood pressure 2-3 times weekly, document, and provide at future  appointments  Follow Up Plan: Telephone follow up appointment with care management team member scheduled for:  05/03/2021 at 8:15 AM      Medication Assistance: Application for [employer]  medication assistance program. in process.  Anticipated assistance start date TBD.  See plan of care for additional detail.  Compliance/Adherence/Medication fill history: Care Gaps: Ophthalmology Exam Hepatitis C Screening Shingrix, Covid 19 booster Foot Exam   [facility] Drugs: Metformin 500 mg last filled on 02/18/2021 for 90 day supply at [facility] 25 mg last filled on 12/27/2020 for 90 day supply at [facility]. Simvastatin 40 mg last filled on 01/24/2021 for 90 day supply at [facility]  Patient's preferred pharmacy is:  [facility] - [place], [place] - [address] [place] [address] Phone: [phone] Fax: [phone]  Uses pill box? Yes Pt endorses 100% compliance  We discussed: Verbal consent obtained for [facility] enhanced pharmacy services (medication synchronization, adherence packaging, delivery coordination). A medication sync plan was created to allow patient to get all medications delivered once every 30 to 90 days per patient preference. Patient understands they have freedom to choose pharmacy and clinical pharmacist will coordinate care between all prescribers and [facility]. Patient decided to: Utilize [facility] for medication synchronization, packaging and delivery  Care Plan and Follow Up Patient Decision:  Patient agrees to Care Plan and Follow-up.  Plan: Telephone follow up appointment with care management team member scheduled for:  05/03/2021 at 8:15 AM  Junius Argyle, PharmD, CPP Clinical Pharmacist [facility]  [phone]

## 2021-03-08 ENCOUNTER — Encounter: Payer: Self-pay | Admitting: Nurse Practitioner

## 2021-03-08 ENCOUNTER — Ambulatory Visit (INDEPENDENT_AMBULATORY_CARE_PROVIDER_SITE_OTHER): Payer: HMO | Admitting: Nurse Practitioner

## 2021-03-08 VITALS — BP 120/80 | HR 94 | Temp 97.7°F | Ht 70.0 in | Wt 176.4 lb

## 2021-03-08 DIAGNOSIS — L239 Allergic contact dermatitis, unspecified cause: Secondary | ICD-10-CM

## 2021-03-08 DIAGNOSIS — E1169 Type 2 diabetes mellitus with other specified complication: Secondary | ICD-10-CM

## 2021-03-08 MED ORDER — DIPHENHYDRAMINE HCL 2 % EX CREA: TOPICAL_CREAM | Freq: Three times a day (TID) | CUTANEOUS | 0 refills | Status: DC | PRN

## 2021-03-08 MED ORDER — GLIPIZIDE ER 5 MG PO TB24: 5.0000 mg | ORAL_TABLET | Freq: Every day | ORAL | 5 refills | Status: DC

## 2021-03-08 NOTE — Assessment & Plan Note (Signed)
Changed metformin to glipizide due to possible cause of rash.

## 2021-03-08 NOTE — Patient Instructions (Addendum)
I think rash is due to metformin stop metformin Start glipizide for diabetes. New prescription sent. If rash does not completely resolve in 2weeks, call office. Ok to use benadryl cream as needed for itching.

## 2021-03-08 NOTE — Progress Notes (Signed)
Subjective:  Patient ID: Candice Pineda, female    DOB: Jun 07, 1946  Age: 75 y.o. MRN: [id number]  CC: Rash (Itching rash since 02/26/2021 and spreading.)  Rash This is a new problem. The current episode started 1 to 4 weeks ago. The problem has been gradually improving since onset. The rash is diffuse. The rash is characterized by itchiness and redness. It is unknown if there was an exposure to a precipitant. Pertinent negatives include no congestion, eye pain, fatigue, joint pain, rhinorrhea, shortness of breath or sore throat. Past treatments include topical steroids. The treatment provided mild relief.  She resume metformin on 3weeks  ago, rash started 2week ago. She denies any new personal hygiene products or food or working in her yard or prolongs sun exposure. She had eval by dermatology 02/29/2020: triamcinolone prescribed.  Reviewed past Medical, Social and Family history today.  Outpatient Medications Prior to Visit  Medication Sig Dispense Refill   albuterol (VENTOLIN HFA) 108 (90 Base) MCG/ACT inhaler INHALE 2 PUFFS EVERY 4 HOURS AS NEEDED WHEEZING AND FOR SHORTNESS OF BREATH     alendronate (FOSAMAX) 70 MG tablet Take 1 tablet (70 mg total) by mouth every 7 (seven) days. Take with a full glass of water on an empty stomach. 4 tablet 11   aspirin EC 81 MG tablet Take 81 mg by mouth daily.     azelastine (ASTELIN) 0.1 % nasal spray Place 2 sprays into both nostrils 2 (two) times daily.     BD PEN NEEDLE NANO 2ND GEN 32G X 4 MM MISC USE TO INJECT INSULIN TWICE DAILY 200 each 1   Blood Glucose Calibration (ACCU-CHEK AVIVA) SOLN USE AS DIRECTED     Blood Glucose Monitoring Suppl (ACCU-CHEK AVIVA) device Use daily to check blood sugar daily     budesonide-formoterol (SYMBICORT) 80-4.5 MCG/ACT inhaler Inhale 2 puffs into the lungs 2 (two) times daily.     hydrochlorothiazide (HYDRODIURIL) 25 MG tablet Take 0.5 tablets (12.5 mg total) by mouth daily. 45 tablet 1   insulin isophane & regular  human (NOVOLIN 70/30 FLEXPEN) (70-30) 100 UNIT/ML KwikPen Take 10unit in AM and 15units at bedtime 15 mL 11   Lancet Device MISC For use with Onetouch Ultra Lancets to check blood sugar twice daily 1 each 0   Lancets (ONETOUCH ULTRASOFT) lancets 1 each by Other route in the morning and at bedtime. Use as instructed 200 each 3   losartan (COZAAR) 25 MG tablet Take 1 tablet (25 mg total) by mouth daily. 90 tablet 3   metFORMIN (GLUCOPHAGE-XR) 500 MG 24 hr tablet Take 1 tablet (500 mg total) by mouth 2 (two) times daily with a meal. 180 tablet 1   mirabegron ER (MYRBETRIQ) 50 MG TB24 tablet Take 50 mg by mouth daily.     Multiple Vitamin (MULTI-VITAMIN DAILY PO) Take 1 tablet by mouth daily.     omeprazole (PRILOSEC) 40 MG capsule Take 1 capsule (40 mg total) by mouth 2 (two) times daily. 180 capsule 0   ONETOUCH ULTRA test strip USE AS DIRECTED EVERY MORNING, AND AT BEDTIME 200 strip 0   potassium chloride SA (KLOR-CON) 20 MEQ tablet Take 1 tablet (20 mEq total) by mouth daily. 90 tablet 1   pyridostigmine (MESTINON) 60 MG tablet Take 0.5 tablets (30 mg total) by mouth 3 (three) times daily. 126 tablet 5   simvastatin (ZOCOR) 40 MG tablet TAKE 1 TABLET(40 MG) BY MOUTH DAILY 90 tablet 3   sucralfate (CARAFATE) 1 g tablet Take 1  g by mouth 4 (four) times daily.     triamcinolone ointment (KENALOG) 0.1 % Apply topically 2 (two) times daily.     Vitamin D, Cholecalciferol, 10 MCG (400 UNIT) CAPS Take 2 capsules by mouth daily.     cephALEXin (KEFLEX) 500 MG capsule  (Patient not taking: Reported on 03/08/2021)     omeprazole (PRILOSEC) 20 MG capsule Take 1 capsule (20 mg total) by mouth in the morning and at bedtime. (Patient taking differently: Take 40 mg by mouth in the morning and at bedtime.) 180 capsule 0   omeprazole (PRILOSEC) 40 MG capsule Take 1 capsule by mouth 2 (two) times daily.     No facility-administered medications prior to visit.    ROS See HPI  Objective:  BP 120/80   Pulse 94    Temp 97.7 F (36.5 C)   Ht 5\' 10"  (1.778 m)   Wt 176 lb 6.4 oz (80 kg)   SpO2 99%   BMI 25.31 kg/m   Physical Exam Skin:    General: Skin is warm and dry.     Findings: Rash present. Rash is urticarial.       Neurological:     Mental Status: She is alert.    Assessment & Plan:  This visit occurred during the SARS-CoV-2 public health emergency.  Safety protocols were in place, including screening questions prior to the visit, additional usage of staff PPE, and extensive cleaning of exam room while observing appropriate contact time as indicated for disinfecting solutions.   Nadeen was seen today for rash.  Diagnoses and all orders for this visit:  Allergic dermatitis -     diphenhydrAMINE (BENADRYL) 2 % cream; Apply topically 3 (three) times daily as needed for itching.  Type 2 diabetes mellitus with other specified complication, without long-term current use of insulin (HCC) -     glipiZIDE (GLUCOTROL XL) 5 MG 24 hr tablet; Take 1 tablet (5 mg total) by mouth daily with breakfast.   Problem List Items Addressed This Visit       Endocrine   Diabetes mellitus (HCC)   Relevant Medications   glipiZIDE (GLUCOTROL XL) 5 MG 24 hr tablet   Other Visit Diagnoses     Allergic dermatitis    -  Primary   Relevant Medications   diphenhydrAMINE (BENADRYL) 2 % cream       Follow-up: No follow-ups on file.  [name], NP

## 2021-03-11 ENCOUNTER — Telehealth: Payer: Self-pay

## 2021-03-11 NOTE — Chronic Care Management (AMB) (Addendum)
Chronic Care Management Pharmacy Assistant   Name: Candice Pineda  MRN: [id number] DOB: 1946/01/28   Reason for Encounter:  Patient assistance coordination  03-11-2021 Patient assistance application filled out for [place] 50 mg. Benefit verification enrollment form sent to [facility] to print for PCP to sign. Patient authorization form will be mailed to patient for signature and return to PCP office. Patient contacted and aware.   Medications: Outpatient Encounter Medications as of 03/11/2021  Medication Sig   albuterol (VENTOLIN HFA) 108 (90 Base) MCG/ACT inhaler INHALE 2 PUFFS EVERY 4 HOURS AS NEEDED WHEEZING AND FOR SHORTNESS OF BREATH   alendronate (FOSAMAX) 70 MG tablet Take 1 tablet (70 mg total) by mouth every 7 (seven) days. Take with a full glass of water on an empty stomach.   aspirin EC 81 MG tablet Take 81 mg by mouth daily.   azelastine (ASTELIN) 0.1 % nasal spray Place 2 sprays into both nostrils 2 (two) times daily.   BD PEN NEEDLE NANO 2ND GEN 32G X 4 MM MISC USE TO INJECT INSULIN TWICE DAILY   Blood Glucose Calibration (ACCU-CHEK AVIVA) SOLN USE AS DIRECTED   Blood Glucose Monitoring Suppl (ACCU-CHEK AVIVA) device Use daily to check blood sugar daily   budesonide-formoterol (SYMBICORT) 80-4.5 MCG/ACT inhaler Inhale 2 puffs into the lungs 2 (two) times daily.   diphenhydrAMINE (BENADRYL) 2 % cream Apply topically 3 (three) times daily as needed for itching.   glipiZIDE (GLUCOTROL XL) 5 MG 24 hr tablet Take 1 tablet (5 mg total) by mouth daily with breakfast.   hydrochlorothiazide (HYDRODIURIL) 25 MG tablet Take 0.5 tablets (12.5 mg total) by mouth daily.   insulin isophane & regular human (NOVOLIN 70/30 FLEXPEN) (70-30) 100 UNIT/ML KwikPen Take 10unit in AM and 15units at bedtime   Lancet Device MISC For use with Onetouch Ultra Lancets to check blood sugar twice daily   Lancets (ONETOUCH ULTRASOFT) lancets 1 each by Other route in  the morning and at bedtime. Use as instructed   losartan (COZAAR) 25 MG tablet Take 1 tablet (25 mg total) by mouth daily.   metFORMIN (GLUCOPHAGE-XR) 500 MG 24 hr tablet Take 1 tablet (500 mg total) by mouth 2 (two) times daily with a meal.   mirabegron ER (MYRBETRIQ) 50 MG TB24 tablet Take 50 mg by mouth daily.   Multiple Vitamin (MULTI-VITAMIN DAILY PO) Take 1 tablet by mouth daily.   omeprazole (PRILOSEC) 40 MG capsule Take 1 capsule (40 mg total) by mouth 2 (two) times daily.   ONETOUCH ULTRA test strip USE AS DIRECTED EVERY MORNING, AND AT BEDTIME   potassium chloride SA (KLOR-CON) 20 MEQ tablet Take 1 tablet (20 mEq total) by mouth daily.   pyridostigmine (MESTINON) 60 MG tablet Take 0.5 tablets (30 mg total) by mouth 3 (three) times daily.   simvastatin (ZOCOR) 40 MG tablet TAKE 1 TABLET(40 MG) BY MOUTH DAILY   sucralfate (CARAFATE) 1 g tablet Take 1 g by mouth 4 (four) times daily.   triamcinolone ointment (KENALOG) 0.1 % Apply topically 2 (two) times daily.   Vitamin D, Cholecalciferol, 10 MCG (400 UNIT) CAPS Take 2 capsules by mouth daily.   No facility-administered encounter medications on file as of 03/11/2021.    Star Rating Drugs: Metformin 500 mg last filled on 02/18/2021 for 90 day supply at [facility] 25 mg last filled on 12/27/2020 for 90 day supply at [facility]. Simvastatin 40 mg last filled on 01/24/2021 for  90 day supply at [place], [place] Pharmacist Assistant [phone]

## 2021-03-14 ENCOUNTER — Ambulatory Visit (INDEPENDENT_AMBULATORY_CARE_PROVIDER_SITE_OTHER): Payer: HMO | Admitting: Family Medicine

## 2021-03-14 ENCOUNTER — Encounter: Payer: Self-pay | Admitting: Family Medicine

## 2021-03-14 ENCOUNTER — Other Ambulatory Visit: Payer: Self-pay

## 2021-03-14 VITALS — BP 120/70 | HR 74 | Temp 98.0°F | Ht 70.0 in | Wt 175.8 lb

## 2021-03-14 DIAGNOSIS — L2082 Flexural eczema: Secondary | ICD-10-CM | POA: Diagnosis not present

## 2021-03-14 MED ORDER — PREDNISONE 20 MG PO TABS: 20.0000 mg | ORAL_TABLET | Freq: Every day | ORAL | 0 refills | Status: DC

## 2021-03-14 NOTE — Progress Notes (Signed)
[facility] [address] [place] [address] Dept: [phone] Dept Fax: [phone]  Office Visit  Subjective:    Patient ID: Candice Pineda, female    DOB: [birth year], 75 y.o..   MRN: [id number]  Chief Complaint  Patient presents with   Follow-up    F/u BP, she states that she has been feeling dizziness and off balance since last office visit when meds were changed.    Blood sugar 167- 200, BP 119/68. Also c/o still having an itching feeling all over.     History of Present Illness:  Patient is in today for assessment of an on-going rash issue. She had first developed a perioral rash on 02/25/2021. She was seen at [facility] ED for this. She was seen by dermatology on 6/13 and placed on TAC cream. She was then followed up by Ms. [name], her PCP, on 6/21 and Benadryl was added to her regimen. She was also asked to stop her metformin and started on glipizde, as the original rash happened about a week after she restarted on metformin (she had been on this for many years in the past, but then had been off for a while) Ms. Swiney notes that since the visit with the dermatologist, she began having rash in other locations., including her neck, axilla, and antecubital areas. There has been some peeling associated with this. All her other medications  are ones she has been on for quite some time.  Past Medical History: Patient Active Problem List   Diagnosis Date Noted   Elevated sed rate 04/27/2020   Syncope 04/21/2020   Orthostatic hypotension 04/21/2020   Excessive daytime sleepiness 04/21/2020   Lightheaded 04/13/2020   Hammertoes of both feet 05/29/2019   Astigmatism with presbyopia, bilateral 11/15/2018   Diabetes mellitus ([place]) 12/06/2016   Coccygeal pain, chronic 06/15/2016   Osteoporosis 02/23/2016   Elevated ferritin 02/01/2016   Age-related nuclear cataract of both eyes 01/22/2016   Mounier-Kuhn bronchiectasis  with acute exacerbation ([place]) 10/21/2015   Breast mass, right 09/14/2015   Renal mass 09/14/2015   Gastrointestinal stromal tumor (GIST) ([place]) 08/26/2015   History of esophageal dilatation 05/25/2015   PVD (posterior vitreous detachment), right eye 02/04/2015   Vitreous floaters of both eyes 02/04/2015   Diverticulosis of large intestine without hemorrhage 01/11/2015   Essential hypertension 09/21/2014   Hypokalemia 05/15/2014   Allergic rhinitis 01/23/2014   Carotid artery stenosis, asymptomatic 01/23/2014   Hypercholesterolemia 01/23/2014   Anemia 01/23/2014   Atopic dermatitis 01/23/2014   Constipation, chronic 01/23/2014   Esophageal reflux 06/19/2013   Background diabetic retinopathy ([place]) 02/22/2013   Generalized anxiety disorder 02/22/2013   Obesity 02/22/2013   Hearing loss 02/22/2013   Urinary incontinence 02/22/2013   Past Surgical History:  Procedure Laterality Date   ABDOMINAL HYSTERECTOMY     BREAST BIOPSY Right    COLONOSCOPY  2021   HAND SURGERY Right    cyst removed   STOMACH SURGERY     Family History  Problem Relation Age of Onset   Hypertension Sister    Hypertension Brother    Stroke Daughter    Outpatient Medications Prior to Visit  Medication Sig Dispense Refill   albuterol (VENTOLIN HFA) 108 (90 Base) MCG/ACT inhaler INHALE 2 PUFFS EVERY 4 HOURS AS NEEDED WHEEZING AND FOR SHORTNESS OF BREATH     alendronate (FOSAMAX) 70 MG tablet Take 1 tablet (70 mg total) by mouth every 7 (seven) days. Take with a full  glass of water on an empty stomach. 4 tablet 11   aspirin EC 81 MG tablet Take 81 mg by mouth daily.     azelastine (ASTELIN) 0.1 % nasal spray Place 2 sprays into both nostrils 2 (two) times daily.     BD PEN NEEDLE NANO 2ND GEN 32G X 4 MM MISC USE TO INJECT INSULIN TWICE DAILY 200 each 1   Blood Glucose Calibration (ACCU-CHEK AVIVA) SOLN USE AS DIRECTED     Blood Glucose Monitoring Suppl (ACCU-CHEK AVIVA) device Use daily to check blood sugar  daily     budesonide-formoterol (SYMBICORT) 80-4.5 MCG/ACT inhaler Inhale 2 puffs into the lungs 2 (two) times daily.     diphenhydrAMINE (BENADRYL) 2 % cream Apply topically 3 (three) times daily as needed for itching. 30 g 0   glipiZIDE (GLUCOTROL XL) 5 MG 24 hr tablet Take 1 tablet (5 mg total) by mouth daily with breakfast. 30 tablet 5   hydrochlorothiazide (HYDRODIURIL) 25 MG tablet Take 0.5 tablets (12.5 mg total) by mouth daily. 45 tablet 1   insulin isophane & regular human (NOVOLIN 70/30 FLEXPEN) (70-30) 100 UNIT/ML KwikPen Take 10unit in AM and 15units at bedtime 15 mL 11   Lancet Device MISC For use with Onetouch Ultra Lancets to check blood sugar twice daily 1 each 0   Lancets (ONETOUCH ULTRASOFT) lancets 1 each by Other route in the morning and at bedtime. Use as instructed 200 each 3   losartan (COZAAR) 25 MG tablet Take 1 tablet (25 mg total) by mouth daily. 90 tablet 3   mirabegron ER (MYRBETRIQ) 50 MG TB24 tablet Take 50 mg by mouth daily.     Multiple Vitamin (MULTI-VITAMIN DAILY PO) Take 1 tablet by mouth daily.     omeprazole (PRILOSEC) 40 MG capsule Take 1 capsule (40 mg total) by mouth 2 (two) times daily. 180 capsule 0   ONETOUCH ULTRA test strip USE AS DIRECTED EVERY MORNING, AND AT BEDTIME 200 strip 0   potassium chloride SA (KLOR-CON) 20 MEQ tablet Take 1 tablet (20 mEq total) by mouth daily. 90 tablet 1   pyridostigmine (MESTINON) 60 MG tablet Take 0.5 tablets (30 mg total) by mouth 3 (three) times daily. 126 tablet 5   simvastatin (ZOCOR) 40 MG tablet TAKE 1 TABLET(40 MG) BY MOUTH DAILY 90 tablet 3   sucralfate (CARAFATE) 1 g tablet Take 1 g by mouth 4 (four) times daily.     triamcinolone ointment (KENALOG) 0.1 % Apply topically 2 (two) times daily.     Vitamin D, Cholecalciferol, 10 MCG (400 UNIT) CAPS Take 2 capsules by mouth daily.     metFORMIN (GLUCOPHAGE-XR) 500 MG 24 hr tablet Take 1 tablet (500 mg total) by mouth 2 (two) times daily with a meal. (Patient not  taking: Reported on 03/14/2021) 180 tablet 1   No facility-administered medications prior to visit.   Allergies  Allergen Reactions   Metformin Hcl Diarrhea   Montelukast     Other reaction(s): Other (See Comments) Dizziness Dizziness    Tetanus Toxoids     Arm extremely painful, arm turned red and swollen   Alendronate Nausea Only    And loose stools. Patient states that she can and wants to continue this medication at this time. And loose stools. Patient states that she can and wants to continue this medication at this time.    Objective:   Today's Vitals   03/14/21 1546  BP: 120/70  Pulse: 74  Temp: 98 F (36.7 C)  TempSrc: Temporal  SpO2: 96%  Weight: 175 lb 12.8 oz (79.7 kg)  Height: 5\' 10"  (1.778 m)   Body mass index is 25.22 kg/m.   General: Well developed, well nourished. No acute distress. Skin: Warm and dry. I cannot appreciate any rash on the face, though the patient feels her skin is rough. She does   have some darkening of the skin around the sides of her neck, towards the nape. She large irregular patches of  macular rash in both axilla and both antecubital fossa, extending a bit onto the forearm. There is a light scale to the   rash and areas of excoriation. Psych: Alert and oriented. Normal mood and affect.  Health Maintenance Due  Topic Date Due   OPHTHALMOLOGY EXAM  Never done   Hepatitis C Screening  Never done   Zoster Vaccines- Shingrix (1 of 2) Never done   COVID-19 Vaccine (4 - Booster for Pfizer series) 11/02/2020   FOOT EXAM  12/29/2020     Assessment & Plan:   1. Flexural eczema I reviewed MS. Reichelt's current medications. Although rash can be seen with many of her oral medications, the timing related to her rash development does not single out any particular medication. Her rash appears eczematous. It is widespread enough, that I feel she would benefit from being on a steroid. I did caution her that her blood sugars may run higher during  the time she is on prednisone. I will have her follow-up in 10 days with Ms. [name] for reassessment.  - predniSONE (DELTASONE) 20 MG tablet; Take 1 tablet (20 mg total) by mouth daily with breakfast.  Dispense: 7 tablet; Refill: 0  Haydee Salter, MD

## 2021-03-24 ENCOUNTER — Encounter: Payer: Self-pay | Admitting: Family Medicine

## 2021-03-24 ENCOUNTER — Other Ambulatory Visit: Payer: Self-pay

## 2021-03-24 ENCOUNTER — Ambulatory Visit (INDEPENDENT_AMBULATORY_CARE_PROVIDER_SITE_OTHER): Payer: HMO | Admitting: Family Medicine

## 2021-03-24 VITALS — BP 120/68 | HR 77 | Temp 98.0°F | Ht 70.0 in | Wt 172.6 lb

## 2021-03-24 DIAGNOSIS — M810 Age-related osteoporosis without current pathological fracture: Secondary | ICD-10-CM | POA: Diagnosis not present

## 2021-03-24 DIAGNOSIS — E1169 Type 2 diabetes mellitus with other specified complication: Secondary | ICD-10-CM

## 2021-03-24 DIAGNOSIS — Z794 Long term (current) use of insulin: Secondary | ICD-10-CM

## 2021-03-24 DIAGNOSIS — L2082 Flexural eczema: Secondary | ICD-10-CM

## 2021-03-24 LAB — COMPREHENSIVE METABOLIC PANEL
ALT: 17 U/L (ref 0–35)
AST: 20 U/L (ref 0–37)
Albumin: 3.7 g/dL (ref 3.5–5.2)
Alkaline Phosphatase: 62 U/L (ref 39–117)
BUN: 23 mg/dL (ref 6–23)
CO2: 29 mEq/L (ref 19–32)
Calcium: 9.6 mg/dL (ref 8.4–10.5)
Chloride: 100 mEq/L (ref 96–112)
Creatinine, Ser: 0.99 mg/dL (ref 0.40–1.20)
GFR: 55.45 mL/min — ABNORMAL LOW (ref >60.00)
Glucose, Bld: 132 mg/dL — ABNORMAL HIGH (ref 70–99)
Potassium: 4.1 mEq/L (ref 3.5–5.1)
Sodium: 138 mEq/L (ref 135–145)
Total Bilirubin: 0.3 mg/dL (ref 0.2–1.2)
Total Protein: 7.1 g/dL (ref 6.0–8.3)

## 2021-03-24 LAB — TSH: TSH: 0.46 u[IU]/mL (ref 0.35–5.50)

## 2021-03-24 MED ORDER — BD PEN NEEDLE NANO 2ND GEN 32G X 4 MM MISC: 1 refills | Status: DC

## 2021-03-24 MED ORDER — ALENDRONATE SODIUM 70 MG PO TABS: 70.0000 mg | ORAL_TABLET | ORAL | 11 refills | Status: DC

## 2021-03-24 NOTE — Progress Notes (Signed)
[facility] [address] [place] [address] Dept: [phone] Dept Fax: [phone]  Office Visit  Subjective:    Patient ID: Candice Pineda, female    DOB: 03/20/1946, 75 y.o..   MRN: [id number]  Chief Complaint  Patient presents with   Follow-up    10 day f/u  Eczema.     History of Present Illness:  Patient is in today for reassessment of her rash. I saw Ms. Tiedeman on 6/27. She had first developed a perioral rash on 02/25/2021. She was seen at [facility] ED for this. She was seen by dermatology on 6/13 and placed on TAC cream. She was then followed up by Ms. [name], her PCP, on 6/21 and Benadryl was added to her regimen. She was also asked to stop her metformin and started on glipizde, as the original rash happened about a week after she restarted on metformin (she had been on this for many years in the past, but then had been off for a while) Ms. Walthour notes that since the visit with the dermatologist, she began having rash in other locations., including her neck, axilla, and antecubital areas. There had been some peeling associated with this. All her other medications  are ones she has been on for quite some time. I prescribed a course of prednisone for what appeared to be an eczematous rash.  Ms. Denne notes that she continues to have some itchiness and scaliness present, despite the oral and topical steroid use. She continues to take some Benadryl for itching. Ms. Occhipinti has stayed off of her metformin and is using glipizide instead. She notes her blood sugars have been running a little high, but recalls we discussed that prednisone might make this happen.   Ms. Stucki also notes she needs a refill of needles for her insulin pens and of her Fosamax.  Past Medical History: Patient Active Problem List   Diagnosis Date Noted   Elevated sed rate 04/27/2020   Syncope 04/21/2020   Orthostatic hypotension 04/21/2020    Excessive daytime sleepiness 04/21/2020   Lightheaded 04/13/2020   Hammertoes of both feet 05/29/2019   Astigmatism with presbyopia, bilateral 11/15/2018   Diabetes mellitus ([place]) 12/06/2016   Coccygeal pain, chronic 06/15/2016   Osteoporosis 02/23/2016   Elevated ferritin 02/01/2016   Age-related nuclear cataract of both eyes 01/22/2016   Mounier-Kuhn bronchiectasis with acute exacerbation ([place]) 10/21/2015   Breast mass, right 09/14/2015   Renal mass 09/14/2015   Gastrointestinal stromal tumor (GIST) ([place]) 08/26/2015   History of esophageal dilatation 05/25/2015   PVD (posterior vitreous detachment), right eye 02/04/2015   Vitreous floaters of both eyes 02/04/2015   Diverticulosis of large intestine without hemorrhage 01/11/2015   Essential hypertension 09/21/2014   Hypokalemia 05/15/2014   Allergic rhinitis 01/23/2014   Carotid artery stenosis, asymptomatic 01/23/2014   Hypercholesterolemia 01/23/2014   Anemia 01/23/2014   Atopic dermatitis 01/23/2014   Constipation, chronic 01/23/2014   Esophageal reflux 06/19/2013   Background diabetic retinopathy ([place]) 02/22/2013   Generalized anxiety disorder 02/22/2013   Obesity 02/22/2013   Hearing loss 02/22/2013   Urinary incontinence 02/22/2013   Past Surgical History:  Procedure Laterality Date   ABDOMINAL HYSTERECTOMY     BREAST BIOPSY Right    COLONOSCOPY  2021   HAND SURGERY Right    cyst removed   STOMACH SURGERY     Family History  Problem Relation Age of Onset   Hypertension Sister    Hypertension Brother  Stroke Daughter    Outpatient Medications Prior to Visit  Medication Sig Dispense Refill   albuterol (VENTOLIN HFA) 108 (90 Base) MCG/ACT inhaler INHALE 2 PUFFS EVERY 4 HOURS AS NEEDED WHEEZING AND FOR SHORTNESS OF BREATH     aspirin EC 81 MG tablet Take 81 mg by mouth daily.     azelastine (ASTELIN) 0.1 % nasal spray Place 2 sprays into both nostrils 2 (two) times daily.     Blood Glucose Calibration  (ACCU-CHEK AVIVA) SOLN USE AS DIRECTED     Blood Glucose Monitoring Suppl (ACCU-CHEK AVIVA) device Use daily to check blood sugar daily     budesonide-formoterol (SYMBICORT) 80-4.5 MCG/ACT inhaler Inhale 2 puffs into the lungs 2 (two) times daily.     diphenhydrAMINE (BENADRYL) 2 % cream Apply topically 3 (three) times daily as needed for itching. 30 g 0   glipiZIDE (GLUCOTROL XL) 5 MG 24 hr tablet Take 1 tablet (5 mg total) by mouth daily with breakfast. 30 tablet 5   hydrochlorothiazide (HYDRODIURIL) 25 MG tablet Take 0.5 tablets (12.5 mg total) by mouth daily. 45 tablet 1   insulin isophane & regular human (NOVOLIN 70/30 FLEXPEN) (70-30) 100 UNIT/ML KwikPen Take 10unit in AM and 15units at bedtime 15 mL 11   Lancet Device MISC For use with Onetouch Ultra Lancets to check blood sugar twice daily 1 each 0   Lancets (ONETOUCH ULTRASOFT) lancets 1 each by Other route in the morning and at bedtime. Use as instructed 200 each 3   losartan (COZAAR) 25 MG tablet Take 1 tablet (25 mg total) by mouth daily. 90 tablet 3   metFORMIN (GLUCOPHAGE-XR) 500 MG 24 hr tablet Take 1 tablet (500 mg total) by mouth 2 (two) times daily with a meal. 180 tablet 1   mirabegron ER (MYRBETRIQ) 50 MG TB24 tablet Take 50 mg by mouth daily.     Multiple Vitamin (MULTI-VITAMIN DAILY PO) Take 1 tablet by mouth daily.     omeprazole (PRILOSEC) 40 MG capsule Take 1 capsule (40 mg total) by mouth 2 (two) times daily. 180 capsule 0   ONETOUCH ULTRA test strip USE AS DIRECTED EVERY MORNING, AND AT BEDTIME 200 strip 0   potassium chloride SA (KLOR-CON) 20 MEQ tablet Take 1 tablet (20 mEq total) by mouth daily. 90 tablet 1   pyridostigmine (MESTINON) 60 MG tablet Take 0.5 tablets (30 mg total) by mouth 3 (three) times daily. 126 tablet 5   simvastatin (ZOCOR) 40 MG tablet TAKE 1 TABLET(40 MG) BY MOUTH DAILY 90 tablet 3   sucralfate (CARAFATE) 1 g tablet Take 1 g by mouth 4 (four) times daily.     triamcinolone ointment (KENALOG) 0.1  % Apply topically 2 (two) times daily.     Vitamin D, Cholecalciferol, 10 MCG (400 UNIT) CAPS Take 2 capsules by mouth daily.     alendronate (FOSAMAX) 70 MG tablet Take 1 tablet (70 mg total) by mouth every 7 (seven) days. Take with a full glass of water on an empty stomach. 4 tablet 11   BD PEN NEEDLE NANO 2ND GEN 32G X 4 MM MISC USE TO INJECT INSULIN TWICE DAILY 200 each 1   predniSONE (DELTASONE) 20 MG tablet Take 1 tablet (20 mg total) by mouth daily with breakfast. (Patient not taking: Reported on 03/24/2021) 7 tablet 0   No facility-administered medications prior to visit.   Allergies  Allergen Reactions   Metformin Hcl Diarrhea   Montelukast     Other reaction(s): Other (See Comments)  Dizziness Dizziness    Tetanus Toxoids     Arm extremely painful, arm turned red and swollen   Alendronate Nausea Only    And loose stools. Patient states that she can and wants to continue this medication at this time. And loose stools. Patient states that she can and wants to continue this medication at this time.      Objective:   Today's Vitals   03/24/21 1056  BP: 120/68  Pulse: 77  Temp: 98 F (36.7 C)  TempSrc: Temporal  SpO2: 99%  Weight: 172 lb 9.6 oz (78.3 kg)  Height: 5\' 10"  (1.778 m)   Body mass index is 24.77 kg/m.   General: Well developed, well nourished. No acute distress. Skin: Warm and dry. There are patches of reddish-brown skin in the axillae and down onto the upper arm with a slight scaliness. There is a simialr patch ont he left upper   chest wall with mild excoriation. Psych: Alert and oriented x3. Normal mood and affect.  Health Maintenance Due  Topic Date Due   OPHTHALMOLOGY EXAM  Never done   Hepatitis C Screening  Never done   Zoster Vaccines- Shingrix (1 of 2) Never done   COVID-19 Vaccine (4 - Booster for Pfizer series) 11/02/2020   FOOT EXAM  12/29/2020     Assessment & Plan:   1. Flexural eczema As Ms. Hoganson continues to have a pruritic rash, I  will check some screening labs to make sure her thyroid, liver, and renal function re normal. I will also refer her back to dermatology, as she has not responded to therapy up to now.  - TSH - Comprehensive metabolic panel - Ambulatory referral to Dermatology  2. Type 2 diabetes mellitus with other specified complication, with long-term current use of insulin (HCC) I will renew her insulin needles. She was uncertain about whether she should be taking metformin. I reinforced that this was stopped and glipizide had been started instead. She will follow-up with Ms. [name] later this month, as scheduled.  - Insulin Pen Needle (BD PEN NEEDLE NANO 2ND GEN) 32G X 4 MM MISC; USE TO INJECT INSULIN TWICE DAILY  Dispense: 200 each; Refill: 1  3. Age-related osteoporosis without current pathological fracture I will renew her Fosamax.  - alendronate (FOSAMAX) 70 MG tablet; Take 1 tablet (70 mg total) by mouth every 7 (seven) days. Take with a full glass of water on an empty stomach.  Dispense: 4 tablet; Refill: 11  Haydee Salter, MD

## 2021-03-28 ENCOUNTER — Telehealth: Payer: Self-pay | Admitting: Nurse Practitioner

## 2021-03-28 NOTE — Telephone Encounter (Signed)
Pt would like to transfer her care from [facility] to Dr. Gena Fray. Is this OK with the both of you?

## 2021-03-28 NOTE — Telephone Encounter (Signed)
Pt would like a cb concerning her most recent lab results. Please advise

## 2021-03-28 NOTE — Telephone Encounter (Signed)
Patient notified VIA phone that they haven't been reviewed by provider yet.  Will call when its done.  Patient agreeable. Dm/cma

## 2021-03-30 DIAGNOSIS — L2084 Intrinsic (allergic) eczema: Secondary | ICD-10-CM | POA: Diagnosis not present

## 2021-03-30 NOTE — Telephone Encounter (Signed)
Patient notified VIA phone.  No questions.  Dm/cma ? ?

## 2021-03-30 NOTE — Telephone Encounter (Signed)
Lft VM to rtn call with female.  Dm/cma

## 2021-03-30 NOTE — Telephone Encounter (Signed)
Please review blood work ordered 03/23/21 and advise.  Thanks. Dm/cma

## 2021-03-31 ENCOUNTER — Ambulatory Visit: Payer: HMO

## 2021-04-04 ENCOUNTER — Other Ambulatory Visit: Payer: Self-pay

## 2021-04-05 ENCOUNTER — Ambulatory Visit (INDEPENDENT_AMBULATORY_CARE_PROVIDER_SITE_OTHER): Payer: HMO | Admitting: *Deleted

## 2021-04-05 DIAGNOSIS — Z0000 Encounter for general adult medical examination without abnormal findings: Secondary | ICD-10-CM

## 2021-04-05 NOTE — Progress Notes (Signed)
Subjective:   Candice Pineda is a 75 y.o. female who presents for Medicare Annual (Subsequent) preventive examination.  I connected with  Valentina Gu on 04/05/21 by a telephone enabled telemedicine application and verified that I am speaking with the correct person using two identifiers.   I discussed the limitations of evaluation and management by telemedicine. The patient expressed understanding and agreed to proceed.   Review of Systems    NA Cardiac Risk Factors include: diabetes mellitus;hypertension;dyslipidemia     Objective:    Today's Vitals   There is no height or weight on file to calculate BMI.  Advanced Directives 04/05/2021 03/25/2020 11/04/2019 10/10/2018  Does Patient Have a Medical Advance Directive? No No No No  Would patient like information on creating a medical advance directive? No - Patient declined Yes (MAU/Ambulatory/Procedural Areas - Information given) Yes (MAU/Ambulatory/Procedural Areas - Information given) -    Current Medications (verified) Outpatient Encounter Medications as of 04/05/2021  Medication Sig   albuterol (VENTOLIN HFA) 108 (90 Base) MCG/ACT inhaler INHALE 2 PUFFS EVERY 4 HOURS AS NEEDED WHEEZING AND FOR SHORTNESS OF BREATH   alendronate (FOSAMAX) 70 MG tablet Take 1 tablet (70 mg total) by mouth every 7 (seven) days. Take with a full glass of water on an empty stomach.   aspirin EC 81 MG tablet Take 81 mg by mouth daily.   azelastine (ASTELIN) 0.1 % nasal spray Place 2 sprays into both nostrils 2 (two) times daily.   Blood Glucose Calibration (ACCU-CHEK AVIVA) SOLN USE AS DIRECTED   Blood Glucose Monitoring Suppl (ACCU-CHEK AVIVA) device Use daily to check blood sugar daily   budesonide-formoterol (SYMBICORT) 80-4.5 MCG/ACT inhaler Inhale 2 puffs into the lungs 2 (two) times daily.   diphenhydrAMINE (BENADRYL) 2 % cream Apply topically 3 (three) times daily as needed for itching.   glipiZIDE (GLUCOTROL XL) 5 MG 24 hr tablet Take 1 tablet (5 mg  total) by mouth daily with breakfast.   hydrochlorothiazide (HYDRODIURIL) 25 MG tablet Take 0.5 tablets (12.5 mg total) by mouth daily.   insulin isophane & regular human (NOVOLIN 70/30 FLEXPEN) (70-30) 100 UNIT/ML KwikPen Take 10unit in AM and 15units at bedtime   Insulin Pen Needle (BD PEN NEEDLE NANO 2ND GEN) 32G X 4 MM MISC USE TO INJECT INSULIN TWICE DAILY   Lancet Device MISC For use with Onetouch Ultra Lancets to check blood sugar twice daily   Lancets (ONETOUCH ULTRASOFT) lancets 1 each by Other route in the morning and at bedtime. Use as instructed   losartan (COZAAR) 25 MG tablet Take 1 tablet (25 mg total) by mouth daily.   metFORMIN (GLUCOPHAGE-XR) 500 MG 24 hr tablet Take 1 tablet (500 mg total) by mouth 2 (two) times daily with a meal.   mirabegron ER (MYRBETRIQ) 50 MG TB24 tablet Take 50 mg by mouth daily.   Multiple Vitamin (MULTI-VITAMIN DAILY PO) Take 1 tablet by mouth daily.   omeprazole (PRILOSEC) 40 MG capsule Take 1 capsule (40 mg total) by mouth 2 (two) times daily.   ONETOUCH ULTRA test strip USE AS DIRECTED EVERY MORNING, AND AT BEDTIME   potassium chloride SA (KLOR-CON) 20 MEQ tablet Take 1 tablet (20 mEq total) by mouth daily.   pyridostigmine (MESTINON) 60 MG tablet Take 0.5 tablets (30 mg total) by mouth 3 (three) times daily.   simvastatin (ZOCOR) 40 MG tablet TAKE 1 TABLET(40 MG) BY MOUTH DAILY   sucralfate (CARAFATE) 1 g tablet Take 1 g by mouth 4 (four) times daily.  triamcinolone ointment (KENALOG) 0.1 % Apply topically 2 (two) times daily.   Vitamin D, Cholecalciferol, 10 MCG (400 UNIT) CAPS Take 2 capsules by mouth daily.   No facility-administered encounter medications on file as of 04/05/2021.    Allergies (verified) Metformin hcl, Montelukast, Tetanus toxoids, and Alendronate   History: Past Medical History:  Diagnosis Date   Allergy    Bronchiectasis ([place])    Diabetes mellitus without complication ([place])    Hypertension    Past Surgical History:   Procedure Laterality Date   ABDOMINAL HYSTERECTOMY     BREAST BIOPSY Right    COLONOSCOPY  2021   HAND SURGERY Right    cyst removed   STOMACH SURGERY     Family History  Problem Relation Age of Onset   Hypertension Sister    Hypertension Brother    Stroke Daughter    Social History   Socioeconomic History   Marital status: Widowed    Spouse name: Not on file   Number of children: Not on file   Years of education: Not on file   Highest education level: Not on file  Occupational History   Occupation: Retired  Tobacco Use   Smoking status: Never   Smokeless tobacco: Never  Vaping Use   Vaping Use: Never used  Substance and Sexual Activity   Alcohol use: Yes    Comment: occasional   Drug use: Not Currently   Sexual activity: Not Currently  Other Topics Concern   Not on file  Social History Narrative   Right handed    Lives alone   Caffeine use: Coffee daily   Social Determinants of Health   Financial Resource Strain: Low Risk    Difficulty of Paying Living Expenses: Not very hard  Food Insecurity: No Food Insecurity   Worried About Running Out of Food in the Last Year: Never true   [place] in the Last Year: Never true  Transportation Needs: No Transportation Needs   Lack of Transportation (Medical): No   Lack of Transportation (Non-Medical): No  Physical Activity: Insufficiently Active   Days of Exercise per Week: 2 days   Minutes of Exercise per Session: 30 min  Stress: No Stress Concern Present   Feeling of Stress : Not at all  Social Connections: Moderately Isolated   Frequency of Communication with Friends and Family: More than three times a week   Frequency of Social Gatherings with Friends and Family: More than three times a week   Attends Religious Services: 1 to 4 times per year   Active Member of [employer] or Organizations: No   Attends Archivist Meetings: Never   Marital Status: Widowed    Tobacco Counseling Counseling given:  Not Answered   Clinical Intake:  Pre-visit preparation completed: Yes  Pain : No/denies pain     Nutritional Risks: None Diabetes: Yes CBG done?: No Did pt. bring in CBG monitor from home?: No  How often do you need to have someone help you when you read instructions, pamphlets, or other written materials from your doctor or pharmacy?: 1 - Never  Diabetic?  Yes  Nutrition Risk Assessment:  Has the patient had any N/V/D within the last 2 months?  No  Does the patient have any non-healing wounds?  No  Has the patient had any unintentional weight loss or weight gain?  No   Diabetes:  Is the patient diabetic?  Yes  If diabetic, was a CBG obtained today?  No  Did the patient bring in their glucometer from home?  No  How often do you monitor your CBG's? 2 x a day.   Financial Strains and Diabetes Management:  Are you having any financial strains with the device, your supplies or your medication? No .  Does the patient want to be seen by Chronic Care Management for management of their diabetes?  Yes  Would the patient like to be referred to a Nutritionist or for Diabetic Management?  No   Diabetic Exams:  Diabetic Eye Exam: Completed 02-2021. Overdue for diabetic eye exam. Pt has been advised about the importance in completing this exam. A referral has been placed today. Message sent to referral coordinator for scheduling purposes. Advised pt to expect a call from office referred to regarding appt.  Diabetic Foot Exam: Completed . Pt has been advised about the importance in completing this exam. Pt is scheduled for diabetic foot exam on every 6 month.    Interpreter Needed?: No  Information entered by :: Leroy Kennedy LPN   Activities of Daily Living In your present state of health, do you have any difficulty performing the following activities: 04/05/2021  Hearing? N  Vision? N  Difficulty concentrating or making decisions? N  Walking or climbing stairs? N  Dressing or  bathing? N  Doing errands, shopping? N  Preparing Food and eating ? N  Using the Toilet? N  In the past six months, have you accidently leaked urine? N  Do you have problems with loss of bowel control? N  Managing your Medications? N  Managing your Finances? N  Housekeeping or managing your Housekeeping? N  Some recent data might be hidden    Patient Care Team: [name], [name], NP as PCP - General (Internal Medicine) [name], [facility] as Pharmacist (Pharmacist)  Indicate any recent Medical Services you may have received from other than [facility] providers in the past year (date may be approximate).     Assessment:   This is a routine wellness examination for Bryan.  Hearing/Vision screen Hearing Screening - Comments:: No trouble hearing  Vision Screening - Comments:: Up to date 02-2021 last visit Next visit in 1 month to check cataracts [place]   Dietary issues and exercise activities discussed: Current Exercise Habits: Home exercise routine, Type of exercise: strength training/weights, Time (Minutes): 20, Frequency (Times/Week): 2, Weekly Exercise (Minutes/Week): 40   Goals Addressed             This Visit's Progress    Patient Stated       Patient stated would like to get more physical activity       Depression Screen PHQ 2/9 Scores 04/05/2021 03/25/2020 12/30/2019 11/04/2019 10/10/2018  PHQ - 2 Score 0 0 0 0 0    Fall Risk Fall Risk  04/05/2021 03/08/2021 11/04/2020 03/25/2020 12/30/2019  Falls in the past year? 0 0 0 0 -  Number falls in past yr: 0 0 0 0 0  Injury with Fall? 0 0 0 0 0  Risk for fall due to : - - No Fall Risks - -  Follow up Falls evaluation completed;Falls prevention discussed - Falls evaluation completed Falls prevention discussed -    FALL RISK PREVENTION PERTAINING TO THE HOME:  Any stairs in or around the home? Yes  If so, are there any without handrails? Yes  Home free of loose throw rugs in walkways, pet beds, electrical cords, etc?  Yes  Adequate lighting in your home to reduce  risk of falls? Yes   ASSISTIVE DEVICES UTILIZED TO PREVENT FALLS:  Life alert? No  Use of a cane, walker or w/c? No  Grab bars in the bathroom? No  Shower chair or bench in shower? No  Elevated toilet seat or a handicapped toilet? No   TIMED UP AND GO:  Was the test performed? No .  Tele-Health Visit  Cognitive Function:   Normal cognitive status assessed by direct observation by this Nurse Health Advisor. No abnormalities found.     6CIT Screen 03/25/2020  What Year? 0 points  What month? 0 points  What time? 0 points  Count back from 20 0 points  Months in reverse 0 points  Repeat phrase 0 points  Total Score 0    Immunizations Immunization History  Administered Date(s) Administered   Influenza, High Dose Seasonal PF 05/26/2015   Influenza,inj,Quad PF,6+ Mos 07/03/2017, 05/23/2019   PFIZER(Purple Top)SARS-COV-2 Vaccination 11/17/2019, 12/15/2019, 07/02/2020   Pneumococcal Conjugate-13 01/12/2015, 01/12/2015   Pneumococcal Polysaccharide-23 08/20/2012, 08/20/2012   Zoster, Live 09/18/2010, 09/18/2010    TDAP status: Due, Education has been provided regarding the importance of this vaccine. Advised may receive this vaccine at local pharmacy or Health Dept. Aware to provide a copy of the vaccination record if obtained from local pharmacy or Health Dept. Verbalized acceptance and understanding.  Flu Vaccine status: Due, Education has been provided regarding the importance of this vaccine. Advised may receive this vaccine at local pharmacy or Health Dept. Aware to provide a copy of the vaccination record if obtained from local pharmacy or Health Dept. Verbalized acceptance and understanding.  Pneumococcal vaccine status: Up to date  Covid-19 vaccine status: Information provided on how to obtain vaccines.   Qualifies for Shingles Vaccine? Yes   Zostavax completed Yes   Shingrix Completed?: No.    Education has been provided  regarding the importance of this vaccine. Patient has been advised to call insurance company to determine out of pocket expense if they have not yet received this vaccine. Advised may also receive vaccine at local pharmacy or Health Dept. Verbalized acceptance and understanding.  Screening Tests Health Maintenance  Topic Date Due   OPHTHALMOLOGY EXAM  Never done   Hepatitis C Screening  Never done   TETANUS/TDAP  Never done   Zoster Vaccines- Shingrix (1 of 2) Never done   COVID-19 Vaccine (4 - Booster for Pfizer series) 11/02/2020   FOOT EXAM  12/29/2020   INFLUENZA VACCINE  04/18/2021   HEMOGLOBIN A1C  06/02/2021   DEXA SCAN  Completed   PNA vac Low Risk Adult  Completed   HPV VACCINES  Aged Out    Health Maintenance  Health Maintenance Due  Topic Date Due   OPHTHALMOLOGY EXAM  Never done   Hepatitis C Screening  Never done   TETANUS/TDAP  Never done   Zoster Vaccines- Shingrix (1 of 2) Never done   COVID-19 Vaccine (4 - Booster for Pfizer series) 11/02/2020   FOOT EXAM  12/29/2020    Colorectal cancer screening: No longer required.   Mammogram status: Completed  . Repeat every year  Bone Density status: Completed 2021. Results reflect: Bone density results: OSTEOPOROSIS. Repeat every 2 years.  Lung Cancer Screening: (Low Dose CT Chest recommended if Age 19-80 years, 30 pack-year currently smoking OR have quit w/in 15years.) does not qualify.   Lung Cancer Screening Referral: na  Additional Screening:  Hepatitis C Screening: does qualify  Vision Screening: Recommended annual ophthalmology exams for early detection of  glaucoma and other disorders of the eye. Is the patient up to date with their annual eye exam?  Yes  Who is the provider or what is the name of the office in which the patient attends annual eye exams? [place]  If pt is not established with a provider, would they like to be referred to a provider to establish care?  established .   Dental Screening:  Recommended annual dental exams for proper oral hygiene  Community Resource Referral / Chronic Care Management: CRR required this visit?  No   CCM required this visit?  No      Plan:     I have personally reviewed and noted the following in the patient's chart:   Medical and social history Use of alcohol, tobacco or illicit drugs  Current medications and supplements including opioid prescriptions.  Functional ability and status Nutritional status Physical activity Advanced directives List of other physicians Hospitalizations, surgeries, and ER visits in previous 12 months Vitals Screenings to include cognitive, depression, and falls Referrals and appointments  In addition, I have reviewed and discussed with patient certain preventive protocols, quality metrics, and best practice recommendations. A written personalized care plan for preventive services as well as general preventive health recommendations were provided to patient.     Leroy Kennedy, LPN   1/94/[id number]   Nurse Notes: na

## 2021-04-05 NOTE — Patient Instructions (Signed)
Candice Pineda , Thank you for taking time to come for your Medicare Wellness Visit. I appreciate your ongoing commitment to your health goals. Please review the following plan we discussed and let me know if I can assist you in the future.   Screening recommendations/referrals: Colonoscopy: no longer required Mammogram: up to date Bone Density: up to date Recommended yearly ophthalmology/optometry visit for glaucoma screening and checkup Recommended yearly dental visit for hygiene and checkup  Vaccinations: Influenza vaccine: Education provided Pneumococcal vaccine: up to date Tdap vaccine: Education provided Shingles vaccine: Education provided    Advanced directives: Education provided  Conditions/risks identified: NA  Next appointment: 04-14-2021 9:00 am Dr. Lorayne Marek   Preventive Care 76 Years and Older, Female Preventive care refers to lifestyle choices and visits with your health care provider that can promote health and wellness. What does preventive care include? A yearly physical exam. This is also called an annual well check. Dental exams once or twice a year. Routine eye exams. Ask your health care provider how often you should have your eyes checked. Personal lifestyle choices, including: Daily care of your teeth and gums. Regular physical activity. Eating a healthy diet. Avoiding tobacco and drug use. Limiting alcohol use. Practicing safe sex. Taking low-dose aspirin every day. Taking vitamin and mineral supplements as recommended by your health care provider. What happens during an annual well check? The services and screenings done by your health care provider during your annual well check will depend on your age, overall health, lifestyle risk factors, and family history of disease. Counseling  Your health care provider may ask you questions about your: Alcohol use. Tobacco use. Drug use. Emotional well-being. Home and relationship well-being. Sexual  activity. Eating habits. History of falls. Memory and ability to understand (cognition). Work and work Statistician. Reproductive health. Screening  You may have the following tests or measurements: Height, weight, and BMI. Blood pressure. Lipid and cholesterol levels. These may be checked every 5 years, or more frequently if you are over 45 years old. Skin check. Lung cancer screening. You may have this screening every year starting at age 7 if you have a 30-pack-year history of smoking and currently smoke or have quit within the past 15 years. Fecal occult blood test (FOBT) of the stool. You may have this test every year starting at age 2. Flexible sigmoidoscopy or colonoscopy. You may have a sigmoidoscopy every 5 years or a colonoscopy every 10 years starting at age 50. Hepatitis C blood test. Hepatitis B blood test. Sexually transmitted disease (STD) testing. Diabetes screening. This is done by checking your blood sugar (glucose) after you have not eaten for a while (fasting). You may have this done every 1-3 years. Bone density scan. This is done to screen for osteoporosis. You may have this done starting at age 43. Mammogram. This may be done every 1-2 years. Talk to your health care provider about how often you should have regular mammograms. Talk with your health care provider about your test results, treatment options, and if necessary, the need for more tests. Vaccines  Your health care provider may recommend certain vaccines, such as: Influenza vaccine. This is recommended every year. Tetanus, diphtheria, and acellular pertussis (Tdap, Td) vaccine. You may need a Td booster every 10 years. Zoster vaccine. You may need this after age 35. Pneumococcal 13-valent conjugate (PCV13) vaccine. One dose is recommended after age 37. Pneumococcal polysaccharide (PPSV23) vaccine. One dose is recommended after age 54. Talk to your health care provider about  which screenings and vaccines  you need and how often you need them. This information is not intended to replace advice given to you by your health care provider. Make sure you discuss any questions you have with your health care provider. Document Released: 10/01/2015 Document Revised: 05/24/2016 Document Reviewed: 07/06/2015 Elsevier Interactive Patient Education  2017 [employer] Prevention in the Home Falls can cause injuries. They can happen to people of all ages. There are many things you can do to make your home safe and to help prevent falls. What can I do on the outside of my home? Regularly fix the edges of walkways and driveways and fix any cracks. Remove anything that might make you trip as you walk through a door, such as a raised step or threshold. Trim any bushes or trees on the path to your home. Use bright outdoor lighting. Clear any walking paths of anything that might make someone trip, such as rocks or tools. Regularly check to see if handrails are loose or broken. Make sure that both sides of any steps have handrails. Any raised decks and porches should have guardrails on the edges. Have any leaves, snow, or ice cleared regularly. Use sand or salt on walking paths during winter. Clean up any spills in your garage right away. This includes oil or grease spills. What can I do in the bathroom? Use night lights. Install grab bars by the toilet and in the tub and shower. Do not use towel bars as grab bars. Use non-skid mats or decals in the tub or shower. If you need to sit down in the shower, use a plastic, non-slip stool. Keep the floor dry. Clean up any water that spills on the floor as soon as it happens. Remove soap buildup in the tub or shower regularly. Attach bath mats securely with double-sided non-slip rug tape. Do not have throw rugs and other things on the floor that can make you trip. What can I do in the bedroom? Use night lights. Make sure that you have a light by your bed that  is easy to reach. Do not use any sheets or blankets that are too big for your bed. They should not hang down onto the floor. Have a firm chair that has side arms. You can use this for support while you get dressed. Do not have throw rugs and other things on the floor that can make you trip. What can I do in the kitchen? Clean up any spills right away. Avoid walking on wet floors. Keep items that you use a lot in easy-to-reach places. If you need to reach something above you, use a strong step stool that has a grab bar. Keep electrical cords out of the way. Do not use floor polish or wax that makes floors slippery. If you must use wax, use non-skid floor wax. Do not have throw rugs and other things on the floor that can make you trip. What can I do with my stairs? Do not leave any items on the stairs. Make sure that there are handrails on both sides of the stairs and use them. Fix handrails that are broken or loose. Make sure that handrails are as long as the stairways. Check any carpeting to make sure that it is firmly attached to the stairs. Fix any carpet that is loose or worn. Avoid having throw rugs at the top or bottom of the stairs. If you do have throw rugs, attach them to the floor with  carpet tape. Make sure that you have a light switch at the top of the stairs and the bottom of the stairs. If you do not have them, ask someone to add them for you. What else can I do to help prevent falls? Wear shoes that: Do not have high heels. Have rubber bottoms. Are comfortable and fit you well. Are closed at the toe. Do not wear sandals. If you use a stepladder: Make sure that it is fully opened. Do not climb a closed stepladder. Make sure that both sides of the stepladder are locked into place. Ask someone to hold it for you, if possible. Clearly mark and make sure that you can see: Any grab bars or handrails. First and last steps. Where the edge of each step is. Use tools that help you  move around (mobility aids) if they are needed. These include: Canes. Walkers. Scooters. Crutches. Turn on the lights when you go into a dark area. Replace any light bulbs as soon as they burn out. Set up your furniture so you have a clear path. Avoid moving your furniture around. If any of your floors are uneven, fix them. If there are any pets around you, be aware of where they are. Review your medicines with your doctor. Some medicines can make you feel dizzy. This can increase your chance of falling. Ask your doctor what other things that you can do to help prevent falls. This information is not intended to replace advice given to you by your health care provider. Make sure you discuss any questions you have with your health care provider. Document Released: 07/01/2009 Document Revised: 02/10/2016 Document Reviewed: 10/09/2014 Elsevier Interactive Patient Education  2017 [employer].

## 2021-04-13 ENCOUNTER — Other Ambulatory Visit: Payer: Self-pay

## 2021-04-14 ENCOUNTER — Ambulatory Visit (INDEPENDENT_AMBULATORY_CARE_PROVIDER_SITE_OTHER): Payer: HMO | Admitting: Nurse Practitioner

## 2021-04-14 ENCOUNTER — Encounter: Payer: Self-pay | Admitting: Nurse Practitioner

## 2021-04-14 VITALS — BP 104/70 | HR 76 | Temp 97.0°F | Wt 168.4 lb

## 2021-04-14 DIAGNOSIS — A4901 Methicillin susceptible Staphylococcus aureus infection, unspecified site: Secondary | ICD-10-CM | POA: Diagnosis not present

## 2021-04-14 DIAGNOSIS — E1169 Type 2 diabetes mellitus with other specified complication: Secondary | ICD-10-CM

## 2021-04-14 DIAGNOSIS — L089 Local infection of the skin and subcutaneous tissue, unspecified: Secondary | ICD-10-CM | POA: Diagnosis not present

## 2021-04-14 DIAGNOSIS — Z794 Long term (current) use of insulin: Secondary | ICD-10-CM

## 2021-04-14 MED ORDER — NOVOLIN 70/30 FLEXPEN (70-30) 100 UNIT/ML ~~LOC~~ SUPN: PEN_INJECTOR | SUBCUTANEOUS | 11 refills | Status: DC

## 2021-04-14 NOTE — Assessment & Plan Note (Signed)
Elevated glucose with recent completion of oral prednisone She reports increase appetite and high carb diet in last 3weeks. Home glucose: 115-120 (AM) and 250-400 (PM-post prandial per patient).  Advised to maintain low carb, low sugar, and high protein diet, maintain adequate oral hydration with water. Increase PM dose to 20units and maintain AM dose at 10units Maintain glipizide dose F/up in 2weeks

## 2021-04-14 NOTE — Progress Notes (Signed)
Subjective:  Patient ID: Candice Pineda, female    DOB: 08-09-1946  Age: 75 y.o. MRN: [id number]  CC: Follow-up (7 week f/u on DM, HTN, and hyperlipidemia. Pt is fasting. /Pt checks blood sugars occasionally, this morning it was 151/Checks BP at home randomly and noticed a low reading this morning of 95/55 when checking it at home. )  HPI  Diabetes mellitus ([place]) Elevated glucose with recent completion of oral prednisone She reports increase appetite and high carb diet in last 3weeks. Home glucose: 115-120 (AM) and 250-400 (PM-post prandial per patient).  Advised to maintain low carb, low sugar, and high protein diet, maintain adequate oral hydration with water. Increase PM dose to 20units and maintain AM dose at 10units Maintain glipizide dose F/up in 2weeks  Persistent rash on torso. Lesions have changed from hives to pustules. resolved itching  Reviewed past Medical, Social and Family history today.  Outpatient Medications Prior to Visit  Medication Sig Dispense Refill   albuterol (VENTOLIN HFA) 108 (90 Base) MCG/ACT inhaler INHALE 2 PUFFS EVERY 4 HOURS AS NEEDED WHEEZING AND FOR SHORTNESS OF BREATH     alendronate (FOSAMAX) 70 MG tablet Take 1 tablet (70 mg total) by mouth every 7 (seven) days. Take with a full glass of water on an empty stomach. 4 tablet 11   aspirin EC 81 MG tablet Take 81 mg by mouth daily.     azelastine (ASTELIN) 0.1 % nasal spray Place 2 sprays into both nostrils 2 (two) times daily.     Blood Glucose Calibration (ACCU-CHEK AVIVA) SOLN USE AS DIRECTED     Blood Glucose Monitoring Suppl (ACCU-CHEK AVIVA) device Use daily to check blood sugar daily     budesonide-formoterol (SYMBICORT) 80-4.5 MCG/ACT inhaler Inhale 2 puffs into the lungs 2 (two) times daily.     diphenhydrAMINE (BENADRYL) 2 % cream Apply topically 3 (three) times daily as needed for itching. 30 g 0   glipiZIDE (GLUCOTROL XL) 5 MG 24 hr tablet Take 1 tablet (5 mg total) by mouth daily with  breakfast. 30 tablet 5   hydrochlorothiazide (HYDRODIURIL) 25 MG tablet Take 0.5 tablets (12.5 mg total) by mouth daily. 45 tablet 1   Insulin Pen Needle (BD PEN NEEDLE NANO 2ND GEN) 32G X 4 MM MISC USE TO INJECT INSULIN TWICE DAILY 200 each 1   Lancet Device MISC For use with Onetouch Ultra Lancets to check blood sugar twice daily 1 each 0   Lancets (ONETOUCH ULTRASOFT) lancets 1 each by Other route in the morning and at bedtime. Use as instructed 200 each 3   losartan (COZAAR) 25 MG tablet Take 1 tablet (25 mg total) by mouth daily. 90 tablet 3   mirabegron ER (MYRBETRIQ) 50 MG TB24 tablet Take 50 mg by mouth daily.     Multiple Vitamin (MULTI-VITAMIN DAILY PO) Take 1 tablet by mouth daily.     omeprazole (PRILOSEC) 40 MG capsule Take 1 capsule (40 mg total) by mouth 2 (two) times daily. 180 capsule 0   ONETOUCH ULTRA test strip USE AS DIRECTED EVERY MORNING, AND AT BEDTIME 200 strip 0   potassium chloride SA (KLOR-CON) 20 MEQ tablet Take 1 tablet (20 mEq total) by mouth daily. 90 tablet 1   pyridostigmine (MESTINON) 60 MG tablet Take 0.5 tablets (30 mg total) by mouth 3 (three) times daily. 126 tablet 5   simvastatin (ZOCOR) 40 MG tablet TAKE 1 TABLET(40 MG) BY MOUTH DAILY 90 tablet 3   sucralfate (CARAFATE) 1 g tablet Take  1 g by mouth 4 (four) times daily.     triamcinolone ointment (KENALOG) 0.1 % Apply topically 2 (two) times daily.     Vitamin D, Cholecalciferol, 10 MCG (400 UNIT) CAPS Take 2 capsules by mouth daily.     insulin isophane & regular human (NOVOLIN 70/30 FLEXPEN) (70-30) 100 UNIT/ML KwikPen Take 10unit in AM and 15units at bedtime 15 mL 11   metFORMIN (GLUCOPHAGE-XR) 500 MG 24 hr tablet Take 1 tablet (500 mg total) by mouth 2 (two) times daily with a meal. 180 tablet 1   No facility-administered medications prior to visit.    ROS See HPI  Objective:  BP 104/70 (BP Location: Left Arm, Patient Position: Sitting, Cuff Size: Large)   Pulse 76   Temp (!) 97 F (36.1 C)  (Temporal)   Wt 168 lb 6.4 oz (76.4 kg)   SpO2 98%   BMI 24.16 kg/m   Physical Exam Cardiovascular:     Rate and Rhythm: Normal rate and regular rhythm.     Pulses: Normal pulses.     Heart sounds: Normal heart sounds.  Pulmonary:     Effort: Pulmonary effort is normal.     Breath sounds: Normal breath sounds.  Musculoskeletal:     Right lower leg: No edema.     Left lower leg: No edema.  Skin:    Findings: Erythema and rash present. Rash is pustular.          Comments: Non tender pustules  Neurological:     Mental Status: She is alert and oriented to person, place, and time.   Assessment & Plan:  This visit occurred during the SARS-CoV-2 public health emergency.  Safety protocols were in place, including screening questions prior to the visit, additional usage of staff PPE, and extensive cleaning of exam room while observing appropriate contact time as indicated for disinfecting solutions.   Kamia was seen today for follow-up.  Diagnoses and all orders for this visit:  Skin pustule -     WOUND CULTURE  Type 2 diabetes mellitus with other specified complication, with long-term current use of insulin (HCC) -     insulin isophane & regular human KwikPen (NOVOLIN 70/30 KWIKPEN) (70-30) 100 UNIT/ML KwikPen; Take 10unit in AM and 20units at bedtime   Problem List Items Addressed This Visit       Endocrine   Diabetes mellitus ([place])    Elevated glucose with recent completion of oral prednisone She reports increase appetite and high carb diet in last 3weeks. Home glucose: 115-120 (AM) and 250-400 (PM-post prandial per patient).  Advised to maintain low carb, low sugar, and high protein diet, maintain adequate oral hydration with water. Increase PM dose to 20units and maintain AM dose at 10units Maintain glipizide dose F/up in 2weeks       Relevant Medications   insulin isophane & regular human KwikPen (NOVOLIN 70/30 KWIKPEN) (70-30) 100 UNIT/ML KwikPen   Other Visit  Diagnoses     Skin pustule    -  Primary   Relevant Orders   WOUND CULTURE       Follow-up: Return in about 2 weeks (around 04/28/2021) for DM (66mns).  CWilfred Lacy NP

## 2021-04-14 NOTE — Patient Instructions (Addendum)
Take insulin BID, hold if glucose is less than 120. F/up in 2weeks with glucose levels. Continue to hold metformin You will be contacted with wound culture results Maintain appt with dermatology

## 2021-04-15 NOTE — Telephone Encounter (Signed)
Pt checking on status of this message.  °

## 2021-04-17 LAB — WOUND CULTURE
MICRO NUMBER:: 12174757
SPECIMEN QUALITY:: ADEQUATE

## 2021-04-18 MED ORDER — SULFAMETHOXAZOLE-TRIMETHOPRIM 400-80 MG PO TABS: 1.0000 | ORAL_TABLET | Freq: Two times a day (BID) | ORAL | 0 refills | Status: DC

## 2021-04-18 NOTE — Addendum Note (Signed)
Addended by: Leana Gamer on: 04/18/2021 08:14 AM   Modules accepted: Orders

## 2021-04-21 NOTE — Progress Notes (Signed)
Verification request sent on 04/19/21

## 2021-04-24 ENCOUNTER — Other Ambulatory Visit: Payer: Self-pay | Admitting: Nurse Practitioner

## 2021-04-24 DIAGNOSIS — Z794 Long term (current) use of insulin: Secondary | ICD-10-CM

## 2021-04-24 DIAGNOSIS — E1169 Type 2 diabetes mellitus with other specified complication: Secondary | ICD-10-CM

## 2021-04-27 ENCOUNTER — Telehealth: Payer: Self-pay | Admitting: Nurse Practitioner

## 2021-04-27 DIAGNOSIS — L308 Other specified dermatitis: Secondary | ICD-10-CM | POA: Diagnosis not present

## 2021-04-27 NOTE — Telephone Encounter (Signed)
Spoke to patient VIA phone, she stated the appointment with Baldo Ash was a f/u skin infection.  She has appointment today with a dermatologist.  Advised that if they feel she is ok then she can cancel the appointment for 04/29/21.  She will let us know.  Dm/cma

## 2021-04-27 NOTE — Telephone Encounter (Signed)
Assuming message below patient was referring to Dr. Gena Fray. I see that she has an appointment in October with Dr. Gena Fray never seen Dr. Ethelene Hal. Have not spoken with patient. Denise please advise.

## 2021-04-27 NOTE — Telephone Encounter (Signed)
My mistake, yes Dr Gena Fray. So sorry, thanks ladies.

## 2021-04-27 NOTE — Telephone Encounter (Signed)
Pt called this morning saying Dr Bebe Shaggy nurse called to tell her she was seeing him in Oct. (Appt for [facility] 10/3). She still has an appt sched with Baldo Ash (8/12). Pt is not sure if she needs to keep that appt with [facility] or cancel. I checked the encounters & could not find anything, please advise.

## 2021-04-28 ENCOUNTER — Ambulatory Visit: Payer: HMO | Admitting: Nurse Practitioner

## 2021-04-29 ENCOUNTER — Ambulatory Visit: Payer: HMO | Admitting: Nurse Practitioner

## 2021-05-02 ENCOUNTER — Telehealth: Payer: Self-pay

## 2021-05-02 NOTE — Progress Notes (Signed)
Spoke to patient to confirmed patient telephone appointment on 05/03/2021 for CCM at 8:15 am with Junius Argyle the Clinical pharmacist.   Patient Verbalized understanding and denies any side effects from any of her current medications.  [place] Pharmacist Assistant [phone]

## 2021-05-03 ENCOUNTER — Ambulatory Visit (INDEPENDENT_AMBULATORY_CARE_PROVIDER_SITE_OTHER): Payer: HMO

## 2021-05-03 DIAGNOSIS — E1159 Type 2 diabetes mellitus with other circulatory complications: Secondary | ICD-10-CM | POA: Diagnosis not present

## 2021-05-03 DIAGNOSIS — I152 Hypertension secondary to endocrine disorders: Secondary | ICD-10-CM | POA: Diagnosis not present

## 2021-05-03 DIAGNOSIS — E113292 Type 2 diabetes mellitus with mild nonproliferative diabetic retinopathy without macular edema, left eye: Secondary | ICD-10-CM | POA: Diagnosis not present

## 2021-05-03 DIAGNOSIS — Z794 Long term (current) use of insulin: Secondary | ICD-10-CM

## 2021-05-03 NOTE — Progress Notes (Signed)
Chronic Care Management Pharmacy Note  05/03/2021 Name:  Candice Pineda MRN:  [id number] DOB:  07/12/1946  Summary: Patient presents for CCM follow-up. Her rash/infection has been improving although she still has spots she is concerned about. She reports her blood sugars are also improving after finishing her steroid course. She was instructed to skip her morning insulin on days where he fasting blood sugar was <120, but she notices then her evening blood sugars are elevated.  Recommendations/Changes made from today's visit: -Counseled to give 50% insulin dose with breakfast or supper if pre-meal blood sugar was <120.   Plan: CPP follow-up in 3 months   Subjective: Candice Pineda is an 75 y.o. year old female who is a primary patient of [name], [name], NP.  The CCM team was consulted for assistance with disease management and care coordination needs.    Engaged with patient by telephone for follow up visit in response to provider referral for pharmacy case management and/or care coordination services.   Consent to Services:  The patient was given information about Chronic Care Management services, agreed to services, and gave verbal consent prior to initiation of services.  Please see initial visit note for detailed documentation.   Patient Care Team: [name], [name], NP as PCP - General (Internal Medicine) [name], [facility] as Pharmacist (Pharmacist)  Recent office visits: 04/14/21: Patient presented to [name], NP for follow-up. Insulin increased to 10 AM, 20 PM. Bactrim started for staph infection.  03/14/21: Patient presented to dr. Gena Fray for eczema. Prednisone 20 mg daily started.  03/08/21: Patient presented to [name], NP for rash. Diphenhydramine 2% topical started. Glipizide 5 mg daily with breakfast started. Metformin stopped.  02/18/2021 [name] NP (PCP)- restart metformin 538m ER BID, AMB Referral to [id number] to  12.59m(1/2tab) daily 02/11/2021 ChWilfred LacyP (PCP)-decrease mestinon dose to 3092mID 12/27/2020 ChaWilfred Lacy (PCP)- Per noted Patient is calling to let the office know that she went to an UC because of her acid reflux her medication was increased from 33m50m 40mg6m3/15/2022 CharlWilfred LacyPCP) - Decrease insulin to 10units in Am and 15units in PM Hold insulin if glucose <120 10/13/2020 CharlWilfred LacyPCP) -  increase potassium to 60 meq (1 tab TID) daily x4 days and then resume 20 meq (1 tab) daily  Recent consult visits: 02/16/2021 [name] (Pulmonology) 02/08/2021 [name] (Pulmonology)-No medication change noted 01/31/2021 [name](Podiatry) -No medication Change noted 01/31/2021 [name] (Pulmonology)- No medication Change noted 01/06/2021 [name])- No medication change noted 11/04/2020 [name] [facility]-C ([facility] ol[facility] (PATANOL) 0.1 % ophthalmic solution; Place 1 drop into both eyes 2 (two) times daily 11/03/2020 [name](Podiatry) No medication changes noted.  Hospital visits: Admitted to the hospital on 02/26/2021 due to Perioral dermatitis; rash . Discharge date was 02/26/2021. [facility] -started None -referred to dermatology    Medication Changes at Hospital Discharge: -Changed None   Medications Discontinued at Hospital Discharge: -Stopped None    Medications that remain the same after Hospital Discharge:?? -All other medications will remain the same.     Admitted to the hospital on 01/25/2021 due to Axillary abscess. Discharge date was 01/25/2021. [facility] -  started cephalexin (KEFLEX) 500 MG capsule  1 capsule (500 mg total) by  mouth 4 times daily for 10 days   Medication Changes at Hospital Discharge: -Changed N/A   Medications Discontinued at Hospital Discharge: -Stopped N/A   Medications that remain the same after Hospital Discharge:?? -All other medications will remain the same.     Admitted to the hospital on 12/17/2020 due to Epigastric pain. Discharge date was 12/17/2020. [facility] at [facility] Discharge:?? -started N/A    Medication Changes at Hospital Discharge: -Changed N/A   Medications Discontinued at Hospital Discharge: -Stopped N/A   Medications that remain the same after Hospital Discharge:?? -All other medications will remain the same.     Objective:  Lab Results  Component Value Date   CREATININE 0.99 03/24/2021   BUN 23 03/24/2021   GFR 55.45 (L) 03/24/2021   NA 138 03/24/2021   K 4.1 03/24/2021   CALCIUM 9.6 03/24/2021   CO2 29 03/24/2021   GLUCOSE 132 (H) 03/24/2021    Lab Results  Component Value Date/Time   HGBA1C 6.3 11/30/2020 10:32 AM   HGBA1C 8.7 (H) 07/27/2020 11:17 AM   GFR 55.45 (L) 03/24/2021 11:40 AM   GFR 77.55 11/30/2020 10:32 AM   MICROALBUR <0.7 12/30/2019 12:07 PM    Last diabetic Eye exam: No results found for: HMDIABEYEEXA  Last diabetic Foot exam: No results found for: HMDIABFOOTEX   Lab Results  Component Value Date   CHOL 150 11/30/2020   HDL 55.30 11/30/2020   LDLCALC 82 11/30/2020   TRIG 64.0 11/30/2020   CHOLHDL 3 11/30/2020    Hepatic Function Latest Ref Rng & Units 03/24/2021 12/17/2020 11/30/2020  Total Protein 6.0 - 8.3 g/dL 7.1 - 7.6  Albumin 3.5 - 5.2 g/dL 3.7 3.7 4.4  AST 0 - 37 U/L 20 19 17   ALT 0 - 35 U/L 17 15 12   Alk Phosphatase 39 - 117 U/L 62 - 65  Total Bilirubin 0.2 - 1.2 mg/dL 0.3 - 0.6  Bilirubin, Direct 0.0 - 0.3 mg/dL - - 0.1    Lab Results  Component Value Date/Time   TSH 0.46 03/24/2021 11:40 AM   TSH 0.78 12/30/2019 12:07 PM     CBC Latest Ref Rng & Units 12/17/2020 07/27/2020 04/13/2020  WBC - 9.0 11.3(H) 9.5  Hemoglobin 12.0 - 16.0 11.6(A) 11.6(L) 11.5(L)  Hematocrit 36 - 46 36 36.6 36.4  Platelets 150 - 399 279 309.0 265.0    No results found for: VD25OH  Clinical ASCVD: No  The ASCVD Risk score ([place]., et al., 2013) failed to calculate for the following reasons:   Unable to determine if patient is Non-Hispanic African American    Depression screen [facility] 2/9 04/05/2021 03/25/2020 12/30/2019  Decreased Interest 0 0 0  Down, Depressed, Hopeless 0 0 0  PHQ - 2 Score 0 0 0    Social History   Tobacco Use  Smoking Status Never  Smokeless Tobacco Never   BP Readings from Last 3 Encounters:  04/14/21 104/70  03/24/21 120/68  03/14/21 120/70   Pulse Readings from Last 3 Encounters:  04/14/21 76  03/24/21 77  03/14/21 74   Wt Readings from Last 3 Encounters:  04/14/21 168 lb 6.4 oz (76.4 kg)  03/24/21 172 lb 9.6 oz (78.3 kg)  03/14/21 175 lb 12.8 oz (79.7 kg)   BMI Readings from Last 3 Encounters:  04/14/21 24.16 kg/m  03/24/21  24.77 kg/m  03/14/21 25.22 kg/m   Last DEXA Scan: 06/29/20   T-Score femoral neck: -2.2  T-Score total hip: NA  T-Score lumbar spine: NA  T-Score forearm radius: -1.1  10-year probability of major osteoporotic fracture: 6.8%  10-year probability of hip fracture: 1.9%  Assessment/Interventions: Review of patient past medical history, allergies, medications, health status, including review of consultants reports, laboratory and other test data, was performed as part of comprehensive evaluation and provision of chronic care management services.   SDOH:  (Social Determinants of Health) assessments and interventions performed: Yes SDOH Interventions    Flowsheet Row Most Recent Value  SDOH Interventions   Financial Strain Interventions Intervention Not Indicated       SDOH Screenings   Alcohol Screen: Low Risk    Last Alcohol Screening Score (AUDIT): 2   Depression (PHQ2-9): Low Risk    PHQ-2 Score: 0  Financial Resource Strain: Low Risk    Difficulty of Paying Living Expenses: Not hard at all  Food Insecurity: No Food Insecurity   Worried About Charity fundraiser in the Last Year: Never true   Ran Out of Food in the Last Year: Never true  Housing: Low Risk    Last Housing Risk Score: 0  Physical Activity: Insufficiently Active   Days of Exercise per Week: 2 days   Minutes of Exercise per Session: 30 min  Social Connections: Moderately Isolated   Frequency of Communication with Friends and Family: More than three times a week   Frequency of Social Gatherings with Friends and Family: More than three times a week   Attends Religious Services: 1 to 4 times per year   Active Member of [employer] or Organizations: No   Attends Archivist Meetings: Never   Marital Status: Widowed  Stress: No Stress Concern Present   Feeling of Stress : Not at all  Tobacco Use: Low Risk    Smoking Tobacco Use: Never   Smokeless Tobacco Use: Never  Transportation Needs: No Transportation Needs   Lack of Transportation (Medical): No   Lack of Transportation (Non-Medical): No    CCM Care Plan  Allergies  Allergen Reactions   Metformin Hcl Diarrhea   Montelukast     Other reaction(s): Other (See Comments) Dizziness Dizziness    Tetanus Toxoids     Arm extremely painful, arm turned red and swollen   Alendronate Nausea Only    And loose stools. Patient states that she can and wants to continue this medication at this time. And loose stools. Patient states that she can and wants to continue this medication at this time.     Medications Reviewed Today     Reviewed by Flossie Buffy, NP (Nurse Practitioner) on 04/14/21 at [place] List Status: <None>   Medication Order Taking? Sig Documenting Provider Last Dose Status Informant  albuterol (VENTOLIN HFA) 108 (90 Base) MCG/ACT inhaler [id number] Yes INHALE 2 PUFFS EVERY 4 HOURS AS  NEEDED WHEEZING AND FOR SHORTNESS OF BREATH [provider] Taking Active   alendronate (FOSAMAX) 70 MG tablet [id number] Yes Take 1 tablet (70 mg total) by mouth every 7 (seven) days. Take with a full glass of water on an empty stomach. Haydee Salter, MD Taking Active   aspirin EC 81 MG tablet [id number] Yes Take 81 mg by mouth daily. [provider] Taking Active Self  azelastine (ASTELIN) 0.1 % nasal spray [id number] Yes Place 2 sprays into both nostrils 2 (two) times daily. [provider] Taking Active   Blood Glucose Calibration (ACCU-CHEK AVIVA) SOLN [id number] Yes USE AS DIRECTED [provider] Taking Active   Blood Glucose Monitoring Suppl (ACCU-CHEK AVIVA) device [id number] Yes Use daily to check blood sugar daily [provider] Taking Active   budesonide-formoterol (SYMBICORT) 80-4.5 MCG/ACT inhaler [id number] Yes Inhale 2 puffs into the lungs 2 (two) times daily. [provider] Taking Active   diphenhydrAMINE (BENADRYL) 2 % cream [id number] Yes Apply topically 3 (three) times daily as needed for itching. [name], [name], NP Taking Active   glipiZIDE (GLUCOTROL XL) 5 MG 24 hr tablet [id number] Yes Take 1 tablet (5 mg total) by mouth daily with breakfast. [name], [name], NP Taking Active   hydrochlorothiazide (HYDRODIURIL) 25 MG tablet [id number] Yes Take 0.5 tablets (12.5 mg total) by mouth daily. [name], [name], NP Taking Active   insulin isophane & regular human KwikPen (NOVOLIN 70/30 KWIKPEN) (70-30) 100 UNIT/ML KwikPen [id number]  Take 10unit in AM and 20units at bedtime [name], [name], NP  Active   Insulin Pen Needle (BD PEN NEEDLE NANO 2ND GEN) 32G X 4 MM MISC [id number] Yes USE TO INJECT INSULIN TWICE DAILY Haydee Salter, MD Taking Active   Lancet Device MISC [id number] Yes For use with Onetouch Ultra Lancets to check blood sugar twice daily [name], [name], NP Taking Active   Lancets [facility] ULTRASOFT) lancets  [id number] Yes 1 each by Other route in the morning and at bedtime. Use as instructed [name], [name], NP Taking Active   losartan (COZAAR) 25 MG tablet [id number] Yes Take 1 tablet (25 mg total) by mouth daily. [name], [name], NP Taking Active   mirabegron ER (MYRBETRIQ) 50 MG TB24 tablet [id number] Yes Take 50 mg by mouth daily. [provider] Taking Active   Multiple Vitamin (MULTI-VITAMIN DAILY PO) [id number] Yes Take 1 tablet by mouth daily. [provider] Taking Active Self  omeprazole (PRILOSEC) 40 MG capsule [id number] Yes Take 1 capsule (40 mg total) by mouth 2 (two) times daily. Flossie Buffy, NP Taking Active   [facility] ULTRA test strip [id number] Yes USE AS DIRECTED EVERY MORNING, AND AT BEDTIME [name], [name], NP Taking Active   potassium chloride SA (KLOR-CON) 20 MEQ tablet [id number] Yes Take 1 tablet (20 mEq total) by mouth daily. [name], [name], NP Taking Active   pyridostigmine (MESTINON) 60 MG tablet [id number] Yes Take 0.5 tablets (30 mg total) by mouth 3 (three) times daily. Flossie Buffy, NP Taking Active   simvastatin (ZOCOR) 40 MG tablet [id number] Yes TAKE 1 TABLET(40 MG) BY MOUTH DAILY [name], [name], NP Taking Active   sucralfate (CARAFATE) 1 g tablet [id number] Yes Take 1 g by mouth 4 (four) times daily. [provider] Taking Active   triamcinolone ointment (KENALOG) 0.1 % [id number] Yes Apply topically 2 (two) times daily. [provider] Taking Active   Vitamin D, Cholecalciferol, 10 MCG (400 UNIT) CAPS [id number] Yes Take 2 capsules by mouth daily. [provider] Taking Active             Patient Active Problem List   Diagnosis Date Noted   Elevated sed rate 04/27/2020   Syncope 04/21/2020   Orthostatic hypotension 04/21/2020   Excessive daytime sleepiness 04/21/2020   Lightheaded 04/13/2020   Hammertoes of both feet 05/29/2019   Astigmatism with presbyopia, bilateral 11/15/2018    Diabetes mellitus ([place]) 12/06/2016   Coccygeal pain, chronic 06/15/2016   Osteoporosis 02/23/2016   Elevated ferritin 02/01/2016  Age-related nuclear cataract of both eyes 01/22/2016   Mounier-Kuhn bronchiectasis with acute exacerbation ([place]) 10/21/2015   Breast mass, right 09/14/2015   Renal mass 09/14/2015   Gastrointestinal stromal tumor (GIST) ([place]) 08/26/2015   History of esophageal dilatation 05/25/2015   PVD (posterior vitreous detachment), right eye 02/04/2015   Vitreous floaters of both eyes 02/04/2015   Diverticulosis of large intestine without hemorrhage 01/11/2015   Essential hypertension 09/21/2014   Hypokalemia 05/15/2014   Allergic rhinitis 01/23/2014   Carotid artery stenosis, asymptomatic 01/23/2014   Hypercholesterolemia 01/23/2014   Anemia 01/23/2014   Atopic dermatitis 01/23/2014   Constipation, chronic 01/23/2014   Esophageal reflux 06/19/2013   Background diabetic retinopathy ([place]) 02/22/2013   Generalized anxiety disorder 02/22/2013   Obesity 02/22/2013   Hearing loss 02/22/2013   Urinary incontinence 02/22/2013    Immunization History  Administered Date(s) Administered   Influenza, High Dose Seasonal PF 05/26/2015   Influenza,inj,Quad PF,6+ Mos 07/03/2017, 05/23/2019   PFIZER(Purple Top)SARS-COV-2 Vaccination 11/17/2019, 12/15/2019, 07/02/2020   Pneumococcal Conjugate-13 01/12/2015, 01/12/2015   Pneumococcal Polysaccharide-23 08/20/2012, 08/20/2012   Zoster, Live 09/18/2010, 09/18/2010    Conditions to be addressed/monitored:  Hypertension, Hyperlipidemia, Diabetes, GERD, Osteoporosis, Allergic Rhinitis, and bronchiectasis  Care Plan : General Pharmacy (Adult)  Updates made by [name], RPH since 05/03/2021 12:00 AM     Problem: Hypertension, Hyperlipidemia, Diabetes, GERD, Osteoporosis, Allergic Rhinitis, and bronchiectasis   Priority: High     Long-Range Goal: Patient-Specific Goal   Start Date: 03/08/2021  Expected End Date:  09/07/2021  This Visit's Progress: On track  Recent Progress: On track  Priority: High  Note:   Current Barriers:  Unable to independently afford treatment regimen Unable to achieve control of diabetes   Pharmacist Clinical Goal(s):  Patient will verbalize ability to afford treatment regimen achieve control of diabetes as evidenced by A1c less than 8%  Interventions: 1:1 collaboration with [name], [name], NP regarding development and update of comprehensive plan of care as evidenced by provider attestation and co-signature Inter-disciplinary care team collaboration (see longitudinal plan of care) Comprehensive medication review performed; medication list updated in electronic medical record  Hypertension (BP goal <140/90) -Controlled -Current treatment: Hydrochlorothiazide 25 mg 1/2 tablet daily  Losartan 25 mg daily Pyridostigmine 60 mg 1/2 tablet three times daily  -Medications previously tried: NA  -Current home readings: 110/57, 124/66   -Denies hypotensive symptoms since decreasing blood presusre medicaitons -Educated on Proper BP monitoring technique; Symptoms of hypotension and importance of maintaining adequate hydration; -Counseled to monitor BP at home 2-3 times weekly, document, and provide log at future appointments -Recommended to continue current medication  Hyperlipidemia: (LDL goal < 100) -Controlled -Current treatment: Simvastatin 40 mg daily  -Current antiplatelet treatment: Aspirin 81 mg daily  -Medications previously tried: NA  -Educated on Importance of limiting foods high in cholesterol; -Recommended to continue current medication  Diabetes (A1c goal <8%) -Not ideally controlled -Current medications: Glipizide XL 5 mg daily   Novolin 70/30 10 units AM, 20 units PM  -Medications previously tried: NA  -Current home glucose readings:  AM: 90-158 PM: 117-200 -Denies hypoglycemic/hyperglycemic symptoms. -She reports her blood sugars are also  improving after finishing her steroid course.  -She was instructed to skip her morning insulin on days where he fasting blood sugar was <120, but she notices then her evening blood sugars are elevated. -Counseled to give 50% insulin dose with breakfast or supper if pre-meal blood sugar was <120.   Bronchiectasis (Goal: control symptoms and prevent exacerbations) -  Uncontrolled -Current treatment  Ventolin HFA 2 puffs every 4 hours as needed -Current allergic rhinitis treatment  Azelastine nasal spray -Medications previously tried: Symbicort  -Patient denies consistent use of maintenance inhaler -Frequency of rescue inhaler use: 2-3 times daily  -Patient with frequent post-nasal drip.  -Counseled on When to use rescue inhaler -Recommended restarting Symbicort  -Recommend restarting cetirizine 10 mg daily   Osteoporosis (Goal Prevent bone fractures) -Controlled -Patient is a candidate for pharmacologic treatment due to T-Score < -2.5 in femoral neck -Current treatment  Alendronate 70 mg weekly (started 10/07/20) Vitamin D 400 units 2 caps daily -Medications previously tried: NA  -Recommend 1200 mg of calcium daily from dietary and supplemental sources. -Recommended Starting Calcium + D supplement 1 tablet twice daily   GERD (Goal: Prevent heartburn) -Controlled -Current treatment  Omeprazole 40 mg twice daily  -Medications previously tried: NA  -Recommended to continue current medication  Overactive Bladder (Goal: NA) -Controlled -Current treatment  Mirabegron ER 50 mg daily  -Medications previously tried: NA  -Recommended to continue current medication  Patient Goals/Self-Care Activities Patient will:  - check glucose twice daily, document, and provide at future appointments check blood pressure 2-3 times weekly, document, and provide at future appointments  Follow Up Plan: Telephone follow up appointment with care management team member scheduled for:  07/21/2021 at 9:45  AM    Medication Assistance: Application for [place]  medication assistance program. in process.  Anticipated assistance start date TBD.  See plan of care for additional detail.  Compliance/Adherence/Medication fill history: Care Gaps: Ophthalmology Exam Hepatitis C Screening Shingrix, Covid 19 booster Foot Exam   Star-Rating Drugs: Metformin 500 mg last filled on 02/18/2021 for 90 day supply at [name] 25 mg last filled on 12/27/2020 for 90 day supply at [facility]. Simvastatin 40 mg last filled on 01/24/2021 for 90 day supply at [facility]  Patient's preferred pharmacy is:  [id number] - [place], [address] AT [address] [address] Phone: [phone] Fax: [facility] #[id number] - [place], [address] AT [address] [place] [address] Phone: [phone] Fax: [phone]  Uses pill box? Yes Pt endorses 100% compliance  Patient decided to: Continue current medication management strategy  Care Plan and Follow Up Patient Decision:  Patient agrees to Care Plan and Follow-up.  Plan: Telephone follow up appointment with care management team member scheduled for:  07/21/2021 at 9:45 AM  Junius Argyle, PharmD, CPP Clinical Pharmacist [facility]  [phone]

## 2021-05-03 NOTE — Patient Instructions (Signed)
Visit Information It was great speaking with you today!  Please let me know if you have any questions about our visit.   Goals Addressed             This Visit's Progress    Monitor and Manage My Blood Sugar-Diabetes Type 2   On track    Timeframe:  Long-Range Goal Priority:  High Start Date: 03/08/2021                            Expected End Date: 09/07/2021                     Follow Up Date 06/17/2021    - check blood sugar at prescribed times - check blood sugar if I feel it is too high or too low - enter blood sugar readings and medication or insulin into daily log - take the blood sugar log to all doctor visits    Why is this important?   Checking your blood sugar at home helps to keep it from getting very high or very low.  Writing the results in a diary or log helps the doctor know how to care for you.  Your blood sugar log should have the time, date and the results.  Also, write down the amount of insulin or other medicine that you take.  Other information, like what you ate, exercise done and how you were feeling, will also be helpful.     Notes:         Patient Care Plan: General Pharmacy (Adult)     Problem Identified: Hypertension, Hyperlipidemia, Diabetes, GERD, Osteoporosis, Allergic Rhinitis, and bronchiectasis   Priority: High     Long-Range Goal: Patient-Specific Goal   Start Date: 03/08/2021  Expected End Date: 09/07/2021  This Visit's Progress: On track  Recent Progress: On track  Priority: High  Note:   Current Barriers:  Unable to independently afford treatment regimen Unable to achieve control of diabetes   Pharmacist Clinical Goal(s):  Patient will verbalize ability to afford treatment regimen achieve control of diabetes as evidenced by A1c less than 8%  Interventions: 1:1 collaboration with [name], [name], NP regarding development and update of comprehensive plan of care as evidenced by provider attestation and  co-signature Inter-disciplinary care team collaboration (see longitudinal plan of care) Comprehensive medication review performed; medication list updated in electronic medical record  Hypertension (BP goal <140/90) -Controlled -Current treatment: Hydrochlorothiazide 25 mg 1/2 tablet daily  Losartan 25 mg daily Pyridostigmine 60 mg 1/2 tablet three times daily  -Medications previously tried: NA  -Current home readings: 110/57, 124/66   -Denies hypotensive symptoms since decreasing blood presusre medicaitons -Educated on Proper BP monitoring technique; Symptoms of hypotension and importance of maintaining adequate hydration; -Counseled to monitor BP at home 2-3 times weekly, document, and provide log at future appointments -Recommended to continue current medication  Hyperlipidemia: (LDL goal < 100) -Controlled -Current treatment: Simvastatin 40 mg daily  -Current antiplatelet treatment: Aspirin 81 mg daily  -Medications previously tried: NA  -Educated on Importance of limiting foods high in cholesterol; -Recommended to continue current medication  Diabetes (A1c goal <8%) -Not ideally controlled -Current medications: Glipizide XL 5 mg daily   Novolin 70/30 10 units AM, 20 units PM  -Medications previously tried: NA  -Current home glucose readings:  AM: 90-158 PM: 117-200 -Denies hypoglycemic/hyperglycemic symptoms. -She reports her blood sugars are also improving after finishing her steroid course.  -  She was instructed to skip her morning insulin on days where he fasting blood sugar was <120, but she notices then her evening blood sugars are elevated. -Counseled to give 50% insulin dose with breakfast or supper if pre-meal blood sugar was <120.   Bronchiectasis (Goal: control symptoms and prevent exacerbations) -Uncontrolled -Current treatment  Ventolin HFA 2 puffs every 4 hours as needed -Current allergic rhinitis treatment  Azelastine nasal spray -Medications  previously tried: Symbicort  -Patient denies consistent use of maintenance inhaler -Frequency of rescue inhaler use: 2-3 times daily  -Patient with frequent post-nasal drip.  -Counseled on When to use rescue inhaler -Recommended restarting Symbicort  -Recommend restarting cetirizine 10 mg daily   Osteoporosis (Goal Prevent bone fractures) -Controlled -Patient is a candidate for pharmacologic treatment due to T-Score < -2.5 in femoral neck -Current treatment  Alendronate 70 mg weekly (started 10/07/20) Vitamin D 400 units 2 caps daily -Medications previously tried: NA  -Recommend 1200 mg of calcium daily from dietary and supplemental sources. -Recommended Starting Calcium + D supplement 1 tablet twice daily   GERD (Goal: Prevent heartburn) -Controlled -Current treatment  Omeprazole 40 mg twice daily  -Medications previously tried: NA  -Recommended to continue current medication  Overactive Bladder (Goal: NA) -Controlled -Current treatment  Mirabegron ER 50 mg daily  -Medications previously tried: NA  -Recommended to continue current medication  Patient Goals/Self-Care Activities Patient will:  - check glucose twice daily, document, and provide at future appointments check blood pressure 2-3 times weekly, document, and provide at future appointments  Follow Up Plan: Telephone follow up appointment with care management team member scheduled for:  07/21/2021 at 9:45 AM      Patient agreed to services and verbal consent obtained.   The patient verbalized understanding of instructions, educational materials, and care plan provided today and declined offer to receive copy of patient instructions, educational materials, and care plan.   Junius Argyle, PharmD, Para [place], CPP Clinical Pharmacist [facility]  [phone]

## 2021-05-04 ENCOUNTER — Other Ambulatory Visit: Payer: Self-pay | Admitting: Nurse Practitioner

## 2021-05-04 DIAGNOSIS — E1169 Type 2 diabetes mellitus with other specified complication: Secondary | ICD-10-CM

## 2021-05-04 DIAGNOSIS — Z794 Long term (current) use of insulin: Secondary | ICD-10-CM

## 2021-05-05 ENCOUNTER — Other Ambulatory Visit: Payer: Self-pay

## 2021-05-05 DIAGNOSIS — Z794 Long term (current) use of insulin: Secondary | ICD-10-CM

## 2021-05-10 DIAGNOSIS — L089 Local infection of the skin and subcutaneous tissue, unspecified: Secondary | ICD-10-CM | POA: Diagnosis not present

## 2021-05-13 DIAGNOSIS — M2042 Other hammer toe(s) (acquired), left foot: Secondary | ICD-10-CM | POA: Diagnosis not present

## 2021-05-13 DIAGNOSIS — B351 Tinea unguium: Secondary | ICD-10-CM | POA: Diagnosis not present

## 2021-05-13 DIAGNOSIS — E119 Type 2 diabetes mellitus without complications: Secondary | ICD-10-CM | POA: Diagnosis not present

## 2021-05-13 DIAGNOSIS — M2041 Other hammer toe(s) (acquired), right foot: Secondary | ICD-10-CM | POA: Diagnosis not present

## 2021-05-13 DIAGNOSIS — Z794 Long term (current) use of insulin: Secondary | ICD-10-CM | POA: Diagnosis not present

## 2021-05-26 DIAGNOSIS — Z1231 Encounter for screening mammogram for malignant neoplasm of breast: Secondary | ICD-10-CM | POA: Diagnosis not present

## 2021-05-27 ENCOUNTER — Other Ambulatory Visit: Payer: Self-pay | Admitting: Physician Assistant

## 2021-05-27 DIAGNOSIS — J309 Allergic rhinitis, unspecified: Secondary | ICD-10-CM

## 2021-05-27 LAB — HM MAMMOGRAPHY

## 2021-05-31 ENCOUNTER — Encounter: Payer: Self-pay | Admitting: Nurse Practitioner

## 2021-06-01 DIAGNOSIS — J471 Bronchiectasis with (acute) exacerbation: Secondary | ICD-10-CM | POA: Diagnosis not present

## 2021-06-10 ENCOUNTER — Telehealth: Payer: Self-pay

## 2021-06-10 DIAGNOSIS — E1169 Type 2 diabetes mellitus with other specified complication: Secondary | ICD-10-CM

## 2021-06-10 DIAGNOSIS — Z794 Long term (current) use of insulin: Secondary | ICD-10-CM

## 2021-06-10 NOTE — Progress Notes (Addendum)
Chronic Care Management Pharmacy Assistant   Name: Candice Pineda  MRN: [id number] DOB: Jun 22, 1946  Reason for Encounter:Diabetes Disease State Call.   Recent office visits:  No Recent Office Visit  Recent consult visits:  05/13/2021 Newton Pigg DPM (Podiatry) Unable to see note 05/10/2021 Karleen Hampshire (Urgent Care) Unable to see note  Hospital visits:  Medication Reconciliation was completed by comparing discharge summary, patient's EMR and Pharmacy list, and upon discussion with patient.  Admitted to the hospital on 06/01/2021 due to Bronchiectasis. Discharge date was 06/01/2021. Discharged from [facility] [place]?Medications Started at [facility] Discharge:?? -started methylprednisolone  4 mg  - doxycycline hyclate (VIBRAMYCIN) 100 MG capsule  1 capsule 2 times daily  Medication Changes at Hospital Discharge: -Changed None  Medications Discontinued at Hospital Discharge: -Stopped None   Medications that remain the same after Hospital Discharge:??  -All other medications will remain the same.    Medications: Outpatient Encounter Medications as of 06/10/2021  Medication Sig   albuterol (VENTOLIN HFA) 108 (90 Base) MCG/ACT inhaler INHALE 2 PUFFS EVERY 4 HOURS AS NEEDED WHEEZING AND FOR SHORTNESS OF BREATH   alendronate (FOSAMAX) 70 MG tablet Take 1 tablet (70 mg total) by mouth every 7 (seven) days. Take with a full glass of water on an empty stomach.   aspirin EC 81 MG tablet Take 81 mg by mouth daily.   azelastine (ASTELIN) 0.1 % nasal spray Place 2 sprays into both nostrils 2 (two) times daily.   Blood Glucose Calibration (ACCU-CHEK AVIVA) SOLN USE AS DIRECTED   Blood Glucose Monitoring Suppl (ACCU-CHEK AVIVA) device Use daily to check blood sugar daily   budesonide-formoterol (SYMBICORT) 80-4.5 MCG/ACT inhaler Inhale 2 puffs into the lungs 2 (two) times daily.   glipiZIDE (GLUCOTROL XL) 5 MG 24 hr tablet Take 1 tablet (5 mg total) by  mouth daily with breakfast.   hydrochlorothiazide (HYDRODIURIL) 25 MG tablet Take 0.5 tablets (12.5 mg total) by mouth daily.   insulin isophane & regular human KwikPen (NOVOLIN 70/30 KWIKPEN) (70-30) 100 UNIT/ML KwikPen Take 10unit in AM and 20units at bedtime   Insulin Pen Needle (BD PEN NEEDLE NANO 2ND GEN) 32G X 4 MM MISC USE TO INJECT INSULIN TWICE DAILY   Lancet Device MISC For use with Onetouch Ultra Lancets to check blood sugar twice daily   Lancets (ONETOUCH ULTRASOFT) lancets FOR USE WITH ONETOUCH ULTRA LANCETS TO CHECK BLOOD SUGAR TWICE DAILY.   losartan (COZAAR) 25 MG tablet Take 1 tablet (25 mg total) by mouth daily.   mirabegron ER (MYRBETRIQ) 50 MG TB24 tablet Take 50 mg by mouth daily.   Multiple Vitamin (MULTI-VITAMIN DAILY PO) Take 1 tablet by mouth daily.   omeprazole (PRILOSEC) 40 MG capsule Take 1 capsule (40 mg total) by mouth 2 (two) times daily.   ONETOUCH ULTRA test strip USE AS DIRECTED EVERY MORNING AND EVERY NIGHT AT BEDTIME   potassium chloride SA (KLOR-CON) 20 MEQ tablet Take 1 tablet (20 mEq total) by mouth daily.   pyridostigmine (MESTINON) 60 MG tablet Take 0.5 tablets (30 mg total) by mouth 3 (three) times daily.   simvastatin (ZOCOR) 40 MG tablet TAKE 1 TABLET(40 MG) BY MOUTH DAILY   sucralfate (CARAFATE) 1 g tablet Take 1 g by mouth 4 (four) times daily.   triamcinolone ointment (KENALOG) 0.1 % Apply topically 2 (two) times daily.   Vitamin D, Cholecalciferol, 10 MCG (400 UNIT) CAPS Take 2 capsules by mouth daily.  No facility-administered encounter medications on file as of 06/10/2021.    Care Gaps: Ophthalmology Exam Hepatitis C screening Tetanus Vaccine COVID-19 Vaccine Shingrix Vaccine Foot Exam Influenza Vaccine Hemoglobin A1C Star Rating Drugs: Losartan 25 mg last filled on 03/27/2021 for 90 day supply at [facility]. Simvastatin 40 mg last filled on 04/07/2021 for 90 day supply at [facility] Glipizide 5 mg last filled on  06/07/2021 for 90 day supply at [facility] Medication fill gaps: None  Recent Relevant Labs: Lab Results  Component Value Date/Time   HGBA1C 6.3 11/30/2020 10:32 AM   HGBA1C 8.7 (H) 07/27/2020 11:17 AM   MICROALBUR <0.7 12/30/2019 12:07 PM    Kidney Function Lab Results  Component Value Date/Time   CREATININE 0.99 03/24/2021 11:40 AM   CREATININE 1.0 12/17/2020 12:00 AM   CREATININE 0.75 11/30/2020 10:32 AM   GFR 55.45 (L) 03/24/2021 11:40 AM    Current antihyperglycemic regimen:  Glipizide XL 5 mg daily   Novolin 70/30 10 units AM, 20 units PM  What recent interventions/DTPs have been made to improve glycemic control:  None ID Have there been any recent hospitalizations or ED visits since last visit with CPP? No Patient denies hypoglycemic symptoms, including Pale, Sweaty, Shaky, Hungry, Nervous/irritable, and Vision changes Patient denies hyperglycemic symptoms, including blurry vision, excessive thirst, fatigue, polyuria, and weakness Patient reports having a dry mouth. How often are you checking your blood sugar? twice daily What are your blood sugars ranging?  Fasting:  On 06/08/2021 it was 188 at 4:10 am. On 06/09/2021 it was 109 at 3:50 am. On 06/10/2021 it was 116 at 3:45 am. On 06/11/2021 it was 112 at 5:00 am. On 06/12/2021 it was 92 at 4:35 am. On 06/13/2021 it was 142 at 4:30 am. On 06/14/2021 it was 128 at 4:10 am. Before meals:  On 06/08/2021 it was 228 at 4:35 pm. On 06/09/2021 it was 243 at 3:45 pm. On 06/10/2021 it was 201 at 4:50 pm. On 06/11/2021 it was 170 at 6:10 pm. On 06/12/2021 it was 225 at 6:00 pm. On 06/13/2021 it was 158 at 3:45 pm. After meals: None Bedtime: None Patient states she has been skipping her insulin dose in the afternoon if her morning reading is below 120 because her provider instructed her to do so.  During the week, how often does your blood glucose drop below 70? Never Are you checking your feet daily/regularly?    Patient reports having as itching sensations in both feet from time to time.  Patient states she is confuse who she is suppose to ask question to when it comes to her medications.Patient states "do I ask my PCP or specialist,but I do established care with my new PCP on 06/20/2021 and have him explain it to me.I also will ask when is the best time to take my medication through out the day".   Adherence Review: Is the patient currently on a STATIN medication? Yes Is the patient currently on ACE/ARB medication? Yes Does the patient have >5 day gap between last estimated fill dates? No   [place] Pharmacist Assistant [phone]   Addendum 06/15/21: Average Fasting blood sugar in the AM is 117, average in the PM is 204. Will have patient increase Insulin to 12 units before breakfast, 20 units before supper until follow-up with Dr. Gena Fray.   Junius Argyle, PharmD, Para [place], CPP Clinical Pharmacist [facility]  [phone]

## 2021-06-15 MED ORDER — NOVOLIN 70/30 FLEXPEN (70-30) 100 UNIT/ML ~~LOC~~ SUPN: PEN_INJECTOR | SUBCUTANEOUS | 11 refills | Status: DC

## 2021-06-15 NOTE — Progress Notes (Signed)
    Chronic Care Management Pharmacy Assistant   Name: Candice Pineda  MRN: [id number] DOB: May 17, 1946  Patient is asking if she needs to be fasting for her appointment on 06/20/2021 with Dr.Rudd to establish care, appointment is at 2:30 pm. Notified Clinical Pharmacist.   Per Clinical Pharmacist, Unclear if she needs to be fasting, I would have her ask Dr. Juliane Lack team. After reviewing her blood sugars, please let her know the following recommendation until she speaks with Dr. Gena Fray next week: Increase your insulin slightly 12 units before breakfast, 20 units before supper.  I reach out to patient PCP office to ask if she needs to be fasting for her appointment on 06/20/2021.  Per PCP office, it is unclear if she needs to be fasting or not, and 2:30 pm is late in the day as well.Patient does not have to be fasting because she can always schedule a lab appointment which has more availability.  Patient verbalized understanding, and agreed/repeated back the clinical pharmacist recommendations  [place] Pharmacist Assistant [phone]

## 2021-06-15 NOTE — Addendum Note (Signed)
Addended by: Daron Offer A on: 06/15/2021 04:03 PM   Modules accepted: Orders

## 2021-06-16 NOTE — Progress Notes (Signed)
    Chronic Care Management Pharmacy Assistant   Name: Candice Pineda  MRN: [id number] DOB: Sep 06, 1946  Patient states last night her sugar was 151 before supper, and she gave her self 20 units of her insulin like instructed to do.Patient reports when she woke up this morning she check her sugar level, and it was 72, so she got worry and was unsure what to do.Patient states she decide to not give herself the 12 unit of insulin right then.Patient reports she ate breakfast, and waited a hour.Then she gave her self the 12 units.Patient states check her blood sugar and it was 148. Patient is asking if this is okay or does she need her insulin to go back to the way it was.Patient reports dizziness but she believes that is related to her Blood pressure medication.  Patient reports her blood pressure this past few weeks was: 101/50,115/59,108/56,104/61,125/64,128/60,and 109/59.  Per Clinical Pharmacist: She should always give her insulin BEFORE meals. Best way is to check her blood sugar 15-30 minutes before she eats and then administer it. If she waits until after she eats that puts her at a higher likelyhood of lows.  In the morning if her blood sugar is less than 120, she should give 6 units of insulin and be sure to eat a meal with some carbs. If it is above 120 she should give the 12 units as I recommended. She should continue 20 units before supper.  Patient asking if she need to do half of 20 units in the afternoon if her blood sugar is lower then 120.Notified Clinical Pharmacist.  Per Clinical Pharmacist: Yes this is fine.Patient should bring her blood sugar/blood pressure readings to her appointment on 06/20/2021.  Patient is aware to feel free to reach out to me at [phone] if she has any further questions or concerns.  [place] Pharmacist Assistant [phone]

## 2021-06-20 ENCOUNTER — Other Ambulatory Visit: Payer: Self-pay

## 2021-06-20 ENCOUNTER — Ambulatory Visit (INDEPENDENT_AMBULATORY_CARE_PROVIDER_SITE_OTHER): Payer: HMO | Admitting: Family Medicine

## 2021-06-20 ENCOUNTER — Encounter: Payer: Self-pay | Admitting: Family Medicine

## 2021-06-20 VITALS — BP 116/70 | HR 75 | Temp 97.4°F | Ht 70.0 in | Wt 176.6 lb

## 2021-06-20 DIAGNOSIS — J309 Allergic rhinitis, unspecified: Secondary | ICD-10-CM | POA: Diagnosis not present

## 2021-06-20 DIAGNOSIS — R200 Anesthesia of skin: Secondary | ICD-10-CM

## 2021-06-20 DIAGNOSIS — F411 Generalized anxiety disorder: Secondary | ICD-10-CM | POA: Diagnosis not present

## 2021-06-20 DIAGNOSIS — M810 Age-related osteoporosis without current pathological fracture: Secondary | ICD-10-CM

## 2021-06-20 DIAGNOSIS — E7800 Pure hypercholesterolemia, unspecified: Secondary | ICD-10-CM

## 2021-06-20 DIAGNOSIS — I10 Essential (primary) hypertension: Secondary | ICD-10-CM | POA: Diagnosis not present

## 2021-06-20 DIAGNOSIS — Z794 Long term (current) use of insulin: Secondary | ICD-10-CM

## 2021-06-20 DIAGNOSIS — E1169 Type 2 diabetes mellitus with other specified complication: Secondary | ICD-10-CM

## 2021-06-20 MED ORDER — CITALOPRAM HYDROBROMIDE 10 MG PO TABS: 10.0000 mg | ORAL_TABLET | Freq: Every day | ORAL | 3 refills | Status: DC

## 2021-06-20 NOTE — Patient Instructions (Signed)
Use any over-the-counter antihistamine once daily Allegra (fexodenadine) Claritin (loratidine) Zyrtec (cetirizine

## 2021-06-20 NOTE — Progress Notes (Signed)
[facility] [address] [place] [address] Dept: [phone] Dept Fax: [phone]  Transfer of Care Office Visit  Subjective:    Patient ID: Yen Wandell, female    DOB: 04-26-1946, 75 y.o..   MRN: [id number]  Chief Complaint  Patient presents with   Establish Care    [facility]-  establish care.  C/o having sinus drainage x year, has taken Sudafed with little relief.  Also having Rt arm numbness x 3 months.        History of Present Illness:  Patient is in today to establish care. Ms. Cansler was born in [place], [place]. She moved to [place] around 12/17/62, hoping for better job opportunities. She divorced her first husband. She was then married to her 2nd husband until he died in 12/16/2017. She has two daughters ( 66 yo- stewardess, lives in [place]; 76 yo lives in [place]). She has 4 grandchildren (one of who was shot in the spine). Ms. 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 She is now retired. She denies any tobacco or drug use, She occasionally drinks alcohol.  Ms. Mace has a history of hypertension. She is managed on HCTZ and losartan. She has had a past history of carotid artery stenosis and hyperlipidemia and is on a daily aspirin and simvastatin. Ms. Tartaglia also has a history of Type 2 diabetes and is managed on Novulin 70/30, 12 units each morning and 20 units each evening.  Ms. Archambault has a history of bronchiectasis. She was treated with a ICS/LABA (Symbicort) but is not taking this any longer. She also has some allergies, for which she uses Astelin spray. She notes shes does have post-nasal drip. She has been using OTC Sudafed, but wonders what else she ight do for this.  Ms. Ficek has a history of chronic constipation. She takes a fiber capsule, but doesn't know if this is enough. Ms. Celmer has a history of gastrointestinal stromal tumors (GIST). She is  currently under surveillance and needs a repeat EGD and colonoscopy in 12/16/24.  Ms. Freedman has a history of osteoporosis and is currently on alendronate and Vitamin D.  Ms. Lindler has a history of anxiety. She notes that she still startles easily and sometimes finds racing thoughts to impair her immediate recall. She asks about taking a medication to treat this.  Ms. Siebel complains of right arm numbness for several months. She denies any associated neck pain. It is the whole arm, but does not seem to involve the hand per se.  Past Medical History: Patient Active Problem List   Diagnosis Date Noted   Elevated sed rate 04/27/2020   Syncope 04/21/2020   Orthostatic hypotension 04/21/2020   Excessive daytime sleepiness 04/21/2020   Hammertoes of both feet 05/29/2019   Astigmatism with presbyopia, bilateral 11/15/2018   Diabetes mellitus ([place]) 12/06/2016   Coccygeal pain, chronic 06/15/2016   Osteoporosis 02/23/2016   Elevated ferritin 02/01/2016   Age-related nuclear cataract of both eyes 01/22/2016   Mounier-Kuhn bronchiectasis with acute exacerbation ([place]) 10/21/2015   Breast mass, right 09/14/2015   Renal mass 09/14/2015   Gastrointestinal stromal tumor (GIST) ([place]) 08/26/2015   History of esophageal dilatation 05/25/2015   PVD (posterior vitreous detachment), right eye 02/04/2015   Vitreous floaters of both eyes 02/04/2015   Diverticulosis of large intestine without hemorrhage 01/11/2015   Essential hypertension 09/21/2014   Allergic rhinitis 01/23/2014   Carotid artery stenosis, asymptomatic  01/23/2014   Hypercholesterolemia 01/23/2014   Anemia 01/23/2014   Atopic dermatitis 01/23/2014   Constipation, chronic 01/23/2014   Esophageal reflux 06/19/2013   Background diabetic retinopathy ([place]) 02/22/2013   Generalized anxiety disorder 02/22/2013   Obesity 02/22/2013   Hearing loss 02/22/2013   Urinary incontinence 02/22/2013   Past Surgical History:  Procedure Laterality Date    ABDOMINAL HYSTERECTOMY     BREAST BIOPSY Right    COLONOSCOPY  2021   HAND SURGERY Right    cyst removed   STOMACH SURGERY     Family History  Problem Relation Age of Onset   Alcohol abuse Mother    Diabetes Father    Hypertension Sister    Cancer Sister        Throat   Cancer Sister        Breast   Hypertension Brother    Diabetes Brother    Cancer Brother        Prostate   Cancer Brother        Prostate   Cancer Brother        Prostate   Cancer Brother        Prostate   Stroke Daughter    Outpatient Medications Prior to Visit  Medication Sig Dispense Refill   albuterol (VENTOLIN HFA) 108 (90 Base) MCG/ACT inhaler INHALE 2 PUFFS EVERY 4 HOURS AS NEEDED WHEEZING AND FOR SHORTNESS OF BREATH     alendronate (FOSAMAX) 70 MG tablet Take 1 tablet (70 mg total) by mouth every 7 (seven) days. Take with a full glass of water on an empty stomach. 4 tablet 11   aspirin EC 81 MG tablet Take 81 mg by mouth daily.     azelastine (ASTELIN) 0.1 % nasal spray Place 2 sprays into both nostrils 2 (two) times daily.     Blood Glucose Calibration (ACCU-CHEK AVIVA) SOLN USE AS DIRECTED     Blood Glucose Monitoring Suppl (ACCU-CHEK AVIVA) device Use daily to check blood sugar daily     fluticasone (FLONASE) 50 MCG/ACT nasal spray Place 2 sprays into both nostrils daily.     glipiZIDE (GLUCOTROL XL) 5 MG 24 hr tablet Take 1 tablet (5 mg total) by mouth daily with breakfast. 30 tablet 5   hydrochlorothiazide (HYDRODIURIL) 25 MG tablet Take 0.5 tablets (12.5 mg total) by mouth daily. 45 tablet 1   insulin isophane & regular human KwikPen (NOVOLIN 70/30 KWIKPEN) (70-30) 100 UNIT/ML KwikPen Inject 12 units into the skin before breakfast and 20 units into skin before supper 15 mL 11   Insulin Pen Needle (BD PEN NEEDLE NANO 2ND GEN) 32G X 4 MM MISC USE TO INJECT INSULIN TWICE DAILY 200 each 1   Lancet Device MISC For use with Onetouch Ultra Lancets to check blood sugar twice daily 1 each 0   Lancets  (ONETOUCH ULTRASOFT) lancets FOR USE WITH ONETOUCH ULTRA LANCETS TO CHECK BLOOD SUGAR TWICE DAILY. 100 each 1   losartan (COZAAR) 25 MG tablet Take 1 tablet (25 mg total) by mouth daily. 90 tablet 3   mirabegron ER (MYRBETRIQ) 50 MG TB24 tablet Take 50 mg by mouth daily.     Multiple Vitamin (MULTI-VITAMIN DAILY PO) Take 1 tablet by mouth daily.     omeprazole (PRILOSEC) 40 MG capsule Take 1 capsule (40 mg total) by mouth 2 (two) times daily. 180 capsule 0   ONETOUCH ULTRA test strip USE AS DIRECTED EVERY MORNING AND EVERY NIGHT AT BEDTIME 200 strip 0   potassium chloride  SA (KLOR-CON) 20 MEQ tablet Take 1 tablet (20 mEq total) by mouth daily. 90 tablet 1   pyridostigmine (MESTINON) 60 MG tablet Take 0.5 tablets (30 mg total) by mouth 3 (three) times daily. 126 tablet 5   simvastatin (ZOCOR) 40 MG tablet TAKE 1 TABLET(40 MG) BY MOUTH DAILY 90 tablet 3   triamcinolone ointment (KENALOG) 0.1 % Apply topically 2 (two) times daily.     Vitamin D, Cholecalciferol, 10 MCG (400 UNIT) CAPS Take 2 capsules by mouth daily.     budesonide-formoterol (SYMBICORT) 80-4.5 MCG/ACT inhaler Inhale 2 puffs into the lungs 2 (two) times daily. (Patient not taking: Reported on 06/20/2021)     sucralfate (CARAFATE) 1 g tablet Take 1 g by mouth 4 (four) times daily. (Patient not taking: Reported on 06/20/2021)     No facility-administered medications prior to visit.   Allergies  Allergen Reactions   Metformin Hcl Diarrhea   Montelukast     Other reaction(s): Other (See Comments) Dizziness Dizziness    Tetanus Toxoids     Arm extremely painful, arm turned red and swollen   Alendronate Nausea Only    And loose stools. Patient states that she can and wants to continue this medication at this time. And loose stools. Patient states that she can and wants to continue this medication at this time.     Objective:   Today's Vitals   06/20/21 1434  BP: 116/70  Pulse: 75  Temp: (!) 97.4 F (36.3 C)  TempSrc:  Temporal  SpO2: 98%  Weight: 176 lb 9.6 oz (80.1 kg)  Height: 5\' 10"  (1.778 m)   Body mass index is 25.34 kg/m.   General: Well developed, well nourished. No acute distress. Extremities: Full ROM of right arm. Strength 5/5. Sensation decreased in   the right forearm. Skin: Warm and dry. No rashes. Neuro:CN II-XII intact. Normal sensation and DTR bilaterally. Psych: Alert and oriented x3. Normal mood and affect.  Health Maintenance Due  Topic Date Due   OPHTHALMOLOGY EXAM  Never done   Hepatitis C Screening  Never done   TETANUS/TDAP  Never done   Zoster Vaccines- Shingrix (1 of 2) Never done   COVID-19 Vaccine (4 - Booster for Pfizer series) 11/02/2020   FOOT EXAM  12/29/2020   HEMOGLOBIN A1C  06/02/2021     Assessment & Plan:   1. Essential hypertension Ms. Tagliaferro's blood pressure is at goal. We will continue her on HCTZ and losartan.  2. Allergic rhinitis, unspecified seasonality, unspecified trigger I recommend MS. Sporer add an oral antihistamine (Allegra, Claritin, or Zyrtec) to her daily regimen to see if this will manage her PND better.  3. Type 2 diabetes mellitus with other specified complication, with long-term current use of insulin ([place]) Due for reassessment. We will continue her on insulin.  - Microalbumin / creatinine urine ratio; Future - Basic metabolic panel; Future - Hemoglobin A1c; Future - Urinalysis, Routine w reflex microscopic; Future  4. Age-related osteoporosis without current pathological fracture Stable on Fosamax and Vitamin D.  5. Generalized anxiety disorder I think it prudent to give Ms. Clermont a trial on an SSRI. I will start her on a low dose of Celexa and reassess her at her next visit.  - citalopram (CELEXA) 10 MG tablet; Take 1 tablet (10 mg total) by mouth daily.  Dispense: 30 tablet; Refill: 3  6. Hypercholesterolemia Due for lipids. I will have Ms. Drzewiecki return for fasting lipids.  - Lipid panel; Future  7. Right  arm  numbness Ms. Malachowski numbness may represent a neuropathy. I will refer her to neurology for assessment.  - Ambulatory referral to Neurology  Haydee Salter, MD

## 2021-06-21 ENCOUNTER — Other Ambulatory Visit: Payer: Self-pay

## 2021-06-21 ENCOUNTER — Other Ambulatory Visit (INDEPENDENT_AMBULATORY_CARE_PROVIDER_SITE_OTHER): Payer: HMO

## 2021-06-21 DIAGNOSIS — Z794 Long term (current) use of insulin: Secondary | ICD-10-CM | POA: Diagnosis not present

## 2021-06-21 DIAGNOSIS — E7800 Pure hypercholesterolemia, unspecified: Secondary | ICD-10-CM

## 2021-06-21 DIAGNOSIS — E1169 Type 2 diabetes mellitus with other specified complication: Secondary | ICD-10-CM | POA: Diagnosis not present

## 2021-06-21 LAB — URINALYSIS, ROUTINE W REFLEX MICROSCOPIC
Bilirubin Urine: NEGATIVE
Hgb urine dipstick: NEGATIVE
Ketones, ur: NEGATIVE
Nitrite: NEGATIVE
RBC / HPF: NONE SEEN (ref 0–?)
Specific Gravity, Urine: 1.01 (ref 1.000–1.030)
Total Protein, Urine: NEGATIVE
Urine Glucose: NEGATIVE
Urobilinogen, UA: 0.2 (ref 0.0–1.0)
pH: 6 (ref 5.0–8.0)

## 2021-06-21 LAB — BASIC METABOLIC PANEL
BUN: 18 mg/dL (ref 6–23)
CO2: 31 mEq/L (ref 19–32)
Calcium: 10 mg/dL (ref 8.4–10.5)
Chloride: 101 mEq/L (ref 96–112)
Creatinine, Ser: 0.8 mg/dL (ref 0.40–1.20)
GFR: 71.49 mL/min (ref >60.00)
Glucose, Bld: 112 mg/dL — ABNORMAL HIGH (ref 70–99)
Potassium: 4.4 mEq/L (ref 3.5–5.1)
Sodium: 140 mEq/L (ref 135–145)

## 2021-06-21 LAB — MICROALBUMIN / CREATININE URINE RATIO
Creatinine,U: 55.3 mg/dL
Microalb Creat Ratio: 1.3 mg/g (ref 0.0–30.0)
Microalb, Ur: <0.7 mg/dL (ref 0.0–1.9)

## 2021-06-21 LAB — HEMOGLOBIN A1C: Hgb A1c MFr Bld: 8.3 % — ABNORMAL HIGH (ref 4.6–6.5)

## 2021-06-21 LAB — LIPID PANEL
Cholesterol: 238 mg/dL — ABNORMAL HIGH (ref 0–200)
HDL: 99.8 mg/dL (ref >39.00)
LDL Cholesterol: 125 mg/dL — ABNORMAL HIGH (ref 0–99)
NonHDL: 138.04
Total CHOL/HDL Ratio: 2
Triglycerides: 63 mg/dL (ref 0.0–149.0)
VLDL: 12.6 mg/dL (ref 0.0–40.0)

## 2021-06-21 NOTE — Progress Notes (Signed)
Per the orders of Dr. Gena Fray pt is here for labs, pt tolerated draw well. Pt was able to leave adequate sample for urine.

## 2021-06-22 ENCOUNTER — Telehealth: Payer: Self-pay | Admitting: Family Medicine

## 2021-06-22 NOTE — Telephone Encounter (Signed)
error 

## 2021-06-22 NOTE — Telephone Encounter (Signed)
Pt took one Celexa in the morning and one at night. She is wondering if she should take a Celexa today, since she took two yesterday. Please advise at [phone].

## 2021-06-22 NOTE — Telephone Encounter (Signed)
Patient notified VIA phone to not take one today since she took one last night ( + 1 in am) and wait till tomorrow to take just 1 tablet daily.  Patient agreeable. Dm/cma

## 2021-06-24 ENCOUNTER — Other Ambulatory Visit: Payer: Self-pay

## 2021-06-27 ENCOUNTER — Other Ambulatory Visit: Payer: Self-pay

## 2021-06-27 ENCOUNTER — Ambulatory Visit (INDEPENDENT_AMBULATORY_CARE_PROVIDER_SITE_OTHER): Payer: HMO | Admitting: Family Medicine

## 2021-06-27 VITALS — BP 130/70 | HR 74 | Temp 97.5°F | Ht 70.0 in | Wt 174.8 lb

## 2021-06-27 DIAGNOSIS — E1169 Type 2 diabetes mellitus with other specified complication: Secondary | ICD-10-CM | POA: Diagnosis not present

## 2021-06-27 DIAGNOSIS — Z794 Long term (current) use of insulin: Secondary | ICD-10-CM | POA: Diagnosis not present

## 2021-06-27 DIAGNOSIS — F411 Generalized anxiety disorder: Secondary | ICD-10-CM

## 2021-06-27 DIAGNOSIS — E7800 Pure hypercholesterolemia, unspecified: Secondary | ICD-10-CM | POA: Diagnosis not present

## 2021-06-27 MED ORDER — SIMVASTATIN 40 MG PO TABS: 60.0000 mg | ORAL_TABLET | Freq: Every day | ORAL | 3 refills | Status: DC

## 2021-06-27 MED ORDER — NOVOLIN 70/30 FLEXPEN (70-30) 100 UNIT/ML ~~LOC~~ SUPN: PEN_INJECTOR | SUBCUTANEOUS | 11 refills | Status: DC

## 2021-06-27 NOTE — Patient Instructions (Signed)
Increase simvastatin 40 mg to 1 1/2 tabs daily (total dose of 60 mg a day). Increase morning insulin to 14 units.

## 2021-06-27 NOTE — Progress Notes (Signed)
[facility] [address] [place] [address] Dept: [phone] Dept Fax: [phone]  Office Visit  Subjective:    Patient ID: Candice Pineda, female    DOB: 07-08-1946, 75 y.o..   MRN: [id number]  Chief Complaint  Patient presents with   Follow-up    Fu to discuss labs and medications.      History of Present Illness:  Patient is in today for review of her recent lab results. Additionally, she brings up that she seemed to have excessive daytime drowsiness from the Celexa she was recently started on. Candice Pineda has a history of anxiety. She had noted that she startles easily and sometimes finds racing thoughts to impair her immediate recall. She had asked about taking a medication to treat this. Since the Celexa made it hard for her to wake up, she stopped taking this. She thinks she might be better off working at increasing her physical activity to help reduce anxiety. She admits that since COVID, she has not been attending church. This was her major avenue for social engagement previously.  Past Medical History: Patient Active Problem List   Diagnosis Date Noted   Elevated sed rate 04/27/2020   Syncope 04/21/2020   Orthostatic hypotension 04/21/2020   Excessive daytime sleepiness 04/21/2020   Hammertoes of both feet 05/29/2019   Astigmatism with presbyopia, bilateral 11/15/2018   Type 2 diabetes mellitus ([place]) 12/06/2016   Coccygeal pain, chronic 06/15/2016   Osteoporosis 02/23/2016   Elevated ferritin 02/01/2016   Age-related nuclear cataract of both eyes 01/22/2016   Mounier-Kuhn bronchiectasis with acute exacerbation ([place]) 10/21/2015   Breast mass, right 09/14/2015   Renal mass 09/14/2015   Gastrointestinal stromal tumor (GIST) ([place]) 08/26/2015   History of esophageal dilatation 05/25/2015   PVD (posterior vitreous detachment), right eye 02/04/2015   Vitreous floaters of both eyes 02/04/2015   Diverticulosis of large  intestine without hemorrhage 01/11/2015   Essential hypertension 09/21/2014   Allergic rhinitis 01/23/2014   Carotid artery stenosis, asymptomatic 01/23/2014   Hypercholesterolemia 01/23/2014   Anemia 01/23/2014   Atopic dermatitis 01/23/2014   Constipation, chronic 01/23/2014   Esophageal reflux 06/19/2013   Background diabetic retinopathy ([place]) 02/22/2013   Generalized anxiety disorder 02/22/2013   Obesity 02/22/2013   Hearing loss 02/22/2013   Urinary incontinence 02/22/2013   Past Surgical History:  Procedure Laterality Date   ABDOMINAL HYSTERECTOMY     BREAST BIOPSY Right    COLONOSCOPY  2021   HAND SURGERY Right    cyst removed   STOMACH SURGERY     Family History  Problem Relation Age of Onset   Alcohol abuse Mother    Diabetes Father    Hypertension Sister    Cancer Sister        Throat   Cancer Sister        Breast   Hypertension Brother    Diabetes Brother    Cancer Brother        Prostate   Cancer Brother        Prostate   Cancer Brother        Prostate   Cancer Brother        Prostate   Stroke Daughter    Outpatient Medications Prior to Visit  Medication Sig Dispense Refill   albuterol (VENTOLIN HFA) 108 (90 Base) MCG/ACT inhaler INHALE 2 PUFFS EVERY 4 HOURS AS NEEDED WHEEZING AND FOR SHORTNESS OF BREATH     alendronate (FOSAMAX) 70 MG tablet Take 1  tablet (70 mg total) by mouth every 7 (seven) days. Take with a full glass of water on an empty stomach. 4 tablet 11   aspirin EC 81 MG tablet Take 81 mg by mouth daily.     azelastine (ASTELIN) 0.1 % nasal spray Place 2 sprays into both nostrils 2 (two) times daily.     Blood Glucose Calibration (ACCU-CHEK AVIVA) SOLN USE AS DIRECTED     Blood Glucose Monitoring Suppl (ACCU-CHEK AVIVA) device Use daily to check blood sugar daily     budesonide-formoterol (SYMBICORT) 80-4.5 MCG/ACT inhaler Inhale 2 puffs into the lungs 2 (two) times daily.     citalopram (CELEXA) 10 MG tablet Take 1 tablet (10 mg total) by  mouth daily. 30 tablet 3   fluticasone (FLONASE) 50 MCG/ACT nasal spray Place 2 sprays into both nostrils daily.     glipiZIDE (GLUCOTROL XL) 5 MG 24 hr tablet Take 1 tablet (5 mg total) by mouth daily with breakfast. 30 tablet 5   hydrochlorothiazide (HYDRODIURIL) 25 MG tablet Take 0.5 tablets (12.5 mg total) by mouth daily. 45 tablet 1   Insulin Pen Needle (BD PEN NEEDLE NANO 2ND GEN) 32G X 4 MM MISC USE TO INJECT INSULIN TWICE DAILY 200 each 1   Lancet Device MISC For use with Onetouch Ultra Lancets to check blood sugar twice daily 1 each 0   Lancets (ONETOUCH ULTRASOFT) lancets FOR USE WITH ONETOUCH ULTRA LANCETS TO CHECK BLOOD SUGAR TWICE DAILY. 100 each 1   losartan (COZAAR) 25 MG tablet Take 1 tablet (25 mg total) by mouth daily. 90 tablet 3   mirabegron ER (MYRBETRIQ) 50 MG TB24 tablet Take 50 mg by mouth daily.     Multiple Vitamin (MULTI-VITAMIN DAILY PO) Take 1 tablet by mouth daily.     omeprazole (PRILOSEC) 40 MG capsule Take 1 capsule (40 mg total) by mouth 2 (two) times daily. 180 capsule 0   ONETOUCH ULTRA test strip USE AS DIRECTED EVERY MORNING AND EVERY NIGHT AT BEDTIME 200 strip 0   potassium chloride SA (KLOR-CON) 20 MEQ tablet Take 1 tablet (20 mEq total) by mouth daily. 90 tablet 1   pyridostigmine (MESTINON) 60 MG tablet Take 0.5 tablets (30 mg total) by mouth 3 (three) times daily. 126 tablet 5   sucralfate (CARAFATE) 1 g tablet Take 1 g by mouth 4 (four) times daily.     triamcinolone ointment (KENALOG) 0.1 % Apply topically 2 (two) times daily.     Vitamin D, Cholecalciferol, 10 MCG (400 UNIT) CAPS Take 2 capsules by mouth daily.     insulin isophane & regular human KwikPen (NOVOLIN 70/30 KWIKPEN) (70-30) 100 UNIT/ML KwikPen Inject 12 units into the skin before breakfast and 20 units into skin before supper 15 mL 11   simvastatin (ZOCOR) 40 MG tablet TAKE 1 TABLET(40 MG) BY MOUTH DAILY 90 tablet 3   No facility-administered medications prior to visit.   Allergies   Allergen Reactions   Metformin Hcl Diarrhea   Montelukast     Other reaction(s): Other (See Comments) Dizziness Dizziness    Tetanus Toxoids     Arm extremely painful, arm turned red and swollen   Alendronate Nausea Only    And loose stools. Patient states that she can and wants to continue this medication at this time. And loose stools. Patient states that she can and wants to continue this medication at this time.    Objective:   Today's Vitals   06/27/21 1110  BP: 130/70  Pulse: 74  Temp: (!) 97.5 F (36.4 C)  TempSrc: Temporal  SpO2: 99%  Weight: 174 lb 12.8 oz (79.3 kg)  Height: 5\' 10"  (1.778 m)   Body mass index is 25.08 kg/m.   General: Well developed, well nourished. No acute distress. Psych: Alert and oriented. Normal mood and affect.  Health Maintenance Due  Topic Date Due   OPHTHALMOLOGY EXAM  Never done   Hepatitis C Screening  Never done   TETANUS/TDAP  Never done   Zoster Vaccines- Shingrix (1 of 2) Never done   COVID-19 Vaccine (4 - Booster for Pfizer series) 11/02/2020   Lab Results Lab Results  Component Value Date   CHOL 238 (H) 06/21/2021   HDL 99.80 06/21/2021   LDLCALC 125 (H) 06/21/2021   TRIG 63.0 06/21/2021   CHOLHDL 2 06/21/2021   Lab Results  Component Value Date   HGBA1C 8.3 (H) 06/21/2021   BMP Latest Ref Rng & Units 06/21/2021 03/24/2021 12/17/2020  Glucose 70 - 99 mg/dL 112(H) 132(H) -  BUN 6 - 23 mg/dL 18 23 24(A)  Creatinine 0.40 - 1.20 mg/dL 0.80 0.99 1.0  Sodium 135 - 145 mEq/L 140 138 139  Potassium 3.5 - 5.1 mEq/L 4.4 4.1 3.6  Chloride 96 - 112 mEq/L 101 100 102  CO2 19 - 32 mEq/L 31 29 33(A)  Calcium 8.4 - 10.5 mg/dL 10.0 9.6 9.8     Assessment & Plan:   1. Generalized anxiety disorder I agree with Candice Pineda stopping the Celexa. We did discuss her returning to church, with appropriate COVID precautions, to allow her to engage more in social interactions. This may help reduce some of the anxiety she had been  experiencing.  2. Type 2 diabetes mellitus with other specified complication, with long-term current use of insulin ([place]) Candice Pineda A1c has increased above goal. I recommend being cautious with her glipizide dosing, so will leave it where it is. We will increase her morning insulin 2 units, as it appears her evening blood sugars are higher, with fasting blood sugars being generally under 100.  - insulin isophane & regular human KwikPen (NOVOLIN 70/30 KWIKPEN) (70-30) 100 UNIT/ML KwikPen; Inject 14 units into the skin before breakfast and 20 units into skin before supper  Dispense: 15 mL; Refill: 11  3. Hypercholesterolemia Her LDL remains above the goal of being <100. I recommend we increase her simvastatin to 60 mg daily.  - simvastatin (ZOCOR) 40 MG tablet; Take 1.5 tablets (60 mg total) by mouth daily at 6 PM.  Dispense: 90 tablet; Refill: 3  Haydee Salter, MD

## 2021-07-21 ENCOUNTER — Telehealth: Payer: HMO

## 2021-08-03 ENCOUNTER — Other Ambulatory Visit: Payer: Self-pay | Admitting: Nurse Practitioner

## 2021-08-03 DIAGNOSIS — E1169 Type 2 diabetes mellitus with other specified complication: Secondary | ICD-10-CM

## 2021-08-03 DIAGNOSIS — I10 Essential (primary) hypertension: Secondary | ICD-10-CM

## 2021-08-04 DIAGNOSIS — M160 Bilateral primary osteoarthritis of hip: Secondary | ICD-10-CM | POA: Diagnosis not present

## 2021-08-04 DIAGNOSIS — R062 Wheezing: Secondary | ICD-10-CM | POA: Diagnosis not present

## 2021-08-04 DIAGNOSIS — R159 Full incontinence of feces: Secondary | ICD-10-CM | POA: Diagnosis not present

## 2021-08-04 DIAGNOSIS — J479 Bronchiectasis, uncomplicated: Secondary | ICD-10-CM | POA: Diagnosis not present

## 2021-08-04 DIAGNOSIS — K5900 Constipation, unspecified: Secondary | ICD-10-CM | POA: Diagnosis not present

## 2021-08-06 ENCOUNTER — Encounter (HOSPITAL_COMMUNITY): Payer: Self-pay

## 2021-08-06 ENCOUNTER — Other Ambulatory Visit: Payer: Self-pay

## 2021-08-06 ENCOUNTER — Emergency Department (HOSPITAL_COMMUNITY): Payer: HMO

## 2021-08-06 ENCOUNTER — Inpatient Hospital Stay (HOSPITAL_COMMUNITY)
Admission: EM | Admit: 2021-08-06 | Discharge: 2021-08-08 | DRG: 312 | Disposition: A | Discharge status: Home / Self Care | Payer: HMO | Attending: Internal Medicine | Admitting: Internal Medicine

## 2021-08-06 DIAGNOSIS — J479 Bronchiectasis, uncomplicated: Secondary | ICD-10-CM

## 2021-08-06 DIAGNOSIS — D329 Benign neoplasm of meninges, unspecified: Secondary | ICD-10-CM | POA: Diagnosis present

## 2021-08-06 DIAGNOSIS — Z794 Long term (current) use of insulin: Secondary | ICD-10-CM | POA: Diagnosis not present

## 2021-08-06 DIAGNOSIS — I951 Orthostatic hypotension: Secondary | ICD-10-CM | POA: Diagnosis present

## 2021-08-06 DIAGNOSIS — E860 Dehydration: Secondary | ICD-10-CM | POA: Diagnosis present

## 2021-08-06 DIAGNOSIS — R55 Syncope and collapse: Secondary | ICD-10-CM | POA: Diagnosis not present

## 2021-08-06 DIAGNOSIS — Z823 Family history of stroke: Secondary | ICD-10-CM

## 2021-08-06 DIAGNOSIS — Z811 Family history of alcohol abuse and dependence: Secondary | ICD-10-CM | POA: Diagnosis not present

## 2021-08-06 DIAGNOSIS — R32 Unspecified urinary incontinence: Secondary | ICD-10-CM | POA: Diagnosis present

## 2021-08-06 DIAGNOSIS — Z808 Family history of malignant neoplasm of other organs or systems: Secondary | ICD-10-CM

## 2021-08-06 DIAGNOSIS — I10 Essential (primary) hypertension: Secondary | ICD-10-CM | POA: Diagnosis present

## 2021-08-06 DIAGNOSIS — Z7984 Long term (current) use of oral hypoglycemic drugs: Secondary | ICD-10-CM

## 2021-08-06 DIAGNOSIS — E1165 Type 2 diabetes mellitus with hyperglycemia: Secondary | ICD-10-CM | POA: Diagnosis present

## 2021-08-06 DIAGNOSIS — Z7983 Long term (current) use of bisphosphonates: Secondary | ICD-10-CM

## 2021-08-06 DIAGNOSIS — R079 Chest pain, unspecified: Secondary | ICD-10-CM | POA: Diagnosis present

## 2021-08-06 DIAGNOSIS — Z8249 Family history of ischemic heart disease and other diseases of the circulatory system: Secondary | ICD-10-CM | POA: Diagnosis not present

## 2021-08-06 DIAGNOSIS — R569 Unspecified convulsions: Secondary | ICD-10-CM

## 2021-08-06 DIAGNOSIS — Z7982 Long term (current) use of aspirin: Secondary | ICD-10-CM

## 2021-08-06 DIAGNOSIS — Z20822 Contact with and (suspected) exposure to covid-19: Secondary | ICD-10-CM | POA: Diagnosis present

## 2021-08-06 DIAGNOSIS — Z8042 Family history of malignant neoplasm of prostate: Secondary | ICD-10-CM

## 2021-08-06 DIAGNOSIS — Z79899 Other long term (current) drug therapy: Secondary | ICD-10-CM

## 2021-08-06 DIAGNOSIS — E119 Type 2 diabetes mellitus without complications: Secondary | ICD-10-CM

## 2021-08-06 DIAGNOSIS — Z803 Family history of malignant neoplasm of breast: Secondary | ICD-10-CM | POA: Diagnosis not present

## 2021-08-06 DIAGNOSIS — Z888 Allergy status to other drugs, medicaments and biological substances status: Secondary | ICD-10-CM | POA: Diagnosis not present

## 2021-08-06 DIAGNOSIS — E869 Volume depletion, unspecified: Secondary | ICD-10-CM | POA: Diagnosis present

## 2021-08-06 DIAGNOSIS — G9389 Other specified disorders of brain: Secondary | ICD-10-CM | POA: Diagnosis not present

## 2021-08-06 DIAGNOSIS — R002 Palpitations: Secondary | ICD-10-CM | POA: Diagnosis present

## 2021-08-06 DIAGNOSIS — F458 Other somatoform disorders: Secondary | ICD-10-CM | POA: Diagnosis present

## 2021-08-06 DIAGNOSIS — E1169 Type 2 diabetes mellitus with other specified complication: Secondary | ICD-10-CM

## 2021-08-06 DIAGNOSIS — Z833 Family history of diabetes mellitus: Secondary | ICD-10-CM

## 2021-08-06 DIAGNOSIS — R29810 Facial weakness: Secondary | ICD-10-CM | POA: Diagnosis present

## 2021-08-06 DIAGNOSIS — D320 Benign neoplasm of cerebral meninges: Secondary | ICD-10-CM | POA: Diagnosis not present

## 2021-08-06 DIAGNOSIS — I491 Atrial premature depolarization: Secondary | ICD-10-CM | POA: Diagnosis not present

## 2021-08-06 DIAGNOSIS — Z887 Allergy status to serum and vaccine status: Secondary | ICD-10-CM | POA: Diagnosis not present

## 2021-08-06 DIAGNOSIS — M810 Age-related osteoporosis without current pathological fracture: Secondary | ICD-10-CM | POA: Diagnosis present

## 2021-08-06 DIAGNOSIS — Z85831 Personal history of malignant neoplasm of soft tissue: Secondary | ICD-10-CM

## 2021-08-06 LAB — CBC
HCT: 37.4 % (ref 36.0–46.0)
Hemoglobin: 11.6 g/dL — ABNORMAL LOW (ref 12.0–15.0)
MCH: 21.8 pg — ABNORMAL LOW (ref 26.0–34.0)
MCHC: 31 g/dL (ref 30.0–36.0)
MCV: 70.3 fL — ABNORMAL LOW (ref 80.0–100.0)
Platelets: 277 10*3/uL (ref 150–400)
RBC: 5.32 MIL/uL — ABNORMAL HIGH (ref 3.87–5.11)
RDW: 15.7 % — ABNORMAL HIGH (ref 11.5–15.5)
WBC: 8.7 10*3/uL (ref 4.0–10.5)
nRBC: 0 % (ref 0.0–0.2)

## 2021-08-06 LAB — COMPREHENSIVE METABOLIC PANEL
ALT: 19 U/L (ref 0–44)
AST: 28 U/L (ref 15–41)
Albumin: 3.2 g/dL — ABNORMAL LOW (ref 3.5–5.0)
Alkaline Phosphatase: 55 U/L (ref 38–126)
Anion gap: 5 (ref 5–15)
BUN: 21 mg/dL (ref 8–23)
CO2: 25 mmol/L (ref 22–32)
Calcium: 8.9 mg/dL (ref 8.9–10.3)
Chloride: 107 mmol/L (ref 98–111)
Creatinine, Ser: 1.01 mg/dL — ABNORMAL HIGH (ref 0.44–1.00)
GFR, Estimated: 58 mL/min — ABNORMAL LOW (ref >60)
Glucose, Bld: 282 mg/dL — ABNORMAL HIGH (ref 70–99)
Potassium: 4.3 mmol/L (ref 3.5–5.1)
Sodium: 137 mmol/L (ref 135–145)
Total Bilirubin: 0.3 mg/dL (ref 0.3–1.2)
Total Protein: 7 g/dL (ref 6.5–8.1)

## 2021-08-06 LAB — CBG MONITORING, ED
Glucose-Capillary: 209 mg/dL — ABNORMAL HIGH (ref 70–99)
Glucose-Capillary: 260 mg/dL — ABNORMAL HIGH (ref 70–99)

## 2021-08-06 LAB — RESP PANEL BY RT-PCR (FLU A&B, COVID) ARPGX2
Influenza A by PCR: NEGATIVE
Influenza B by PCR: NEGATIVE
SARS Coronavirus 2 by RT PCR: NEGATIVE

## 2021-08-06 LAB — TROPONIN I (HIGH SENSITIVITY)
Troponin I (High Sensitivity): 3 ng/L (ref <18)
Troponin I (High Sensitivity): 4 ng/L (ref <18)

## 2021-08-06 MED ORDER — SIMVASTATIN 20 MG PO TABS
60.0000 mg | ORAL_TABLET | Freq: Every day | ORAL | Status: DC
  Administered 2021-08-07: 60 mg via ORAL
  Filled 2021-08-06: qty 3

## 2021-08-06 MED ORDER — LORAZEPAM 2 MG/ML IJ SOLN: 1.0000 mg | INTRAMUSCULAR | Status: DC | PRN

## 2021-08-06 MED ORDER — PANTOPRAZOLE SODIUM 40 MG PO TBEC
40.0000 mg | DELAYED_RELEASE_TABLET | Freq: Every day | ORAL | Status: DC
  Administered 2021-08-07 – 2021-08-08 (×2): 40 mg via ORAL
  Filled 2021-08-06 (×2): qty 1

## 2021-08-06 MED ORDER — VITAMIN D 25 MCG (1000 UNIT) PO TABS
1000.0000 [IU] | ORAL_TABLET | Freq: Every day | ORAL | Status: DC
Start: 2021-08-07 — End: 2021-08-08
  Administered 2021-08-07 – 2021-08-08 (×2): 1000 [IU] via ORAL
  Filled 2021-08-06 (×2): qty 1

## 2021-08-06 MED ORDER — LOSARTAN POTASSIUM 25 MG PO TABS
25.0000 mg | ORAL_TABLET | Freq: Every day | ORAL | Status: DC
  Administered 2021-08-07 – 2021-08-08 (×2): 25 mg via ORAL
  Filled 2021-08-06 (×2): qty 1

## 2021-08-06 MED ORDER — POTASSIUM CHLORIDE CRYS ER 20 MEQ PO TBCR
20.0000 meq | EXTENDED_RELEASE_TABLET | Freq: Every day | ORAL | Status: DC
  Administered 2021-08-07 – 2021-08-08 (×2): 20 meq via ORAL
  Filled 2021-08-06 (×2): qty 1

## 2021-08-06 MED ORDER — ASPIRIN EC 81 MG PO TBEC
81.0000 mg | DELAYED_RELEASE_TABLET | Freq: Every day | ORAL | Status: DC
  Administered 2021-08-07 – 2021-08-08 (×2): 81 mg via ORAL
  Filled 2021-08-06 (×2): qty 1

## 2021-08-06 MED ORDER — INSULIN ASPART 100 UNIT/ML IJ SOLN
0.0000 [IU] | Freq: Three times a day (TID) | INTRAMUSCULAR | Status: DC
  Administered 2021-08-07: 2 [IU] via SUBCUTANEOUS
  Administered 2021-08-07: 5 [IU] via SUBCUTANEOUS
  Administered 2021-08-07: 3 [IU] via SUBCUTANEOUS

## 2021-08-06 MED ORDER — SODIUM CHLORIDE 0.9 % IV SOLN
75.0000 mL/h | INTRAVENOUS | Status: DC
  Administered 2021-08-06: 75 mL/h via INTRAVENOUS

## 2021-08-06 MED ORDER — ENOXAPARIN SODIUM 40 MG/0.4ML IJ SOSY
40.0000 mg | PREFILLED_SYRINGE | INTRAMUSCULAR | Status: DC
  Administered 2021-08-06 – 2021-08-07 (×2): 40 mg via SUBCUTANEOUS
  Filled 2021-08-06 (×2): qty 0.4

## 2021-08-06 MED ORDER — INSULIN GLARGINE-YFGN 100 UNIT/ML ~~LOC~~ SOLN
10.0000 [IU] | Freq: Two times a day (BID) | SUBCUTANEOUS | Status: DC
  Administered 2021-08-06 – 2021-08-08 (×4): 10 [IU] via SUBCUTANEOUS
  Filled 2021-08-06 (×5): qty 0.1

## 2021-08-06 MED ORDER — HYDROCHLOROTHIAZIDE 12.5 MG PO TABS
12.5000 mg | ORAL_TABLET | Freq: Every day | ORAL | Status: DC
  Administered 2021-08-07: 12.5 mg via ORAL
  Filled 2021-08-06: qty 1

## 2021-08-06 MED ORDER — FLUTICASONE FUROATE-VILANTEROL 100-25 MCG/ACT IN AEPB
1.0000 | INHALATION_SPRAY | Freq: Every day | RESPIRATORY_TRACT | Status: DC
  Administered 2021-08-07 – 2021-08-08 (×2): 1 via RESPIRATORY_TRACT
  Filled 2021-08-06: qty 28

## 2021-08-06 MED ORDER — ALBUTEROL SULFATE (2.5 MG/3ML) 0.083% IN NEBU: 5.0000 mg | INHALATION_SOLUTION | Freq: Once | RESPIRATORY_TRACT | Status: DC

## 2021-08-06 MED ORDER — CHLORHEXIDINE GLUCONATE 0.12% ORAL RINSE (MEDLINE KIT)
15.0000 mL | Freq: Two times a day (BID) | OROMUCOSAL | Status: DC
  Administered 2021-08-07: 15 mL via OROMUCOSAL

## 2021-08-06 MED ORDER — ORAL CARE MOUTH RINSE
15.0000 mL | OROMUCOSAL | Status: DC
  Administered 2021-08-07 – 2021-08-08 (×6): 15 mL via OROMUCOSAL

## 2021-08-06 MED ORDER — PYRIDOSTIGMINE BROMIDE 60 MG PO TABS
30.0000 mg | ORAL_TABLET | Freq: Three times a day (TID) | ORAL | Status: DC
  Administered 2021-08-06 – 2021-08-08 (×5): 30 mg via ORAL
  Filled 2021-08-06 (×8): qty 0.5

## 2021-08-06 NOTE — ED Triage Notes (Signed)
Per EMS, Pt, from family's house. Presents after a syncopal episode and seizure like activity.  Family reports Pt's L arm and chest "jerked."  Episode lasted around 70mins.  Pt currently has not neuro deficits.  Urinary incontinence noted.    Pt only c/o "feeling heavy" all over and chronic complaints.

## 2021-08-06 NOTE — ED Provider Notes (Signed)
[facility] EMERGENCY DEPARTMENT Provider Note   CSN: [id number] Arrival date & time: 08/06/21  1447     History Chief Complaint  Patient presents with   Loss of Consciousness   Seizures    Terrance Lanahan is a 75 y.o. female.  HPI Level 5 caveat acuity of condition patient presents via EMS 75 year old female history of bronchiectasis, diabetes, hypertension presents today via EMS with report of episode of unresponsiveness with focal movement of her left upper extremity.  Patient remembers feeling very lightheaded and generally weak.  She reports family helped her move to the couch.  She does not remember what occurred after that.  EMS reports that the family noted that she had some left arm shaking.  She was unresponsive for about 5 minutes.  She was confused for several minutes afterwards but then was back to baseline within several minutes.  They did not report any generalized tonic-clonic seizures.  She had loss of urinary continence.  She denies any headache.  She states she is having a little bit of cough that is nonproductive but no dyspnea or chest pain.  She denies nausea vomiting or diarrhea.  She has a history of diabetes EMS reports that her blood sugar was in the 200s on their arrival.  Prehospital blood pressure reported as 128/98 with normal heart rate.     Past Medical History:  Diagnosis Date   Allergy    Bronchiectasis ([place])    Diabetes mellitus without complication ([place])    Hypertension     Patient Active Problem List   Diagnosis Date Noted   Seizure-like activity ([place]) 08/06/2021   Bronchiectasis ([place]) 08/06/2021   Elevated sed rate 04/27/2020   Syncope 04/21/2020   Orthostatic hypotension 04/21/2020   Excessive daytime sleepiness 04/21/2020   Hammertoes of both feet 05/29/2019   Astigmatism with presbyopia, bilateral 11/15/2018   Type 2 diabetes mellitus ([place]) 12/06/2016   Coccygeal pain, chronic 06/15/2016   Osteoporosis 02/23/2016    Elevated ferritin 02/01/2016   Age-related nuclear cataract of both eyes 01/22/2016   Mounier-Kuhn bronchiectasis with acute exacerbation ([place]) 10/21/2015   Breast mass, right 09/14/2015   Renal mass 09/14/2015   Gastrointestinal stromal tumor (GIST) ([place]) 08/26/2015   History of esophageal dilatation 05/25/2015   PVD (posterior vitreous detachment), right eye 02/04/2015   Vitreous floaters of both eyes 02/04/2015   Diverticulosis of large intestine without hemorrhage 01/11/2015   Essential hypertension 09/21/2014   Allergic rhinitis 01/23/2014   Carotid artery stenosis, asymptomatic 01/23/2014   Hypercholesterolemia 01/23/2014   Anemia 01/23/2014   Atopic dermatitis 01/23/2014   Constipation, chronic 01/23/2014   Esophageal reflux 06/19/2013   Background diabetic retinopathy ([place]) 02/22/2013   Generalized anxiety disorder 02/22/2013   Obesity 02/22/2013   Hearing loss 02/22/2013   Urinary incontinence 02/22/2013    Past Surgical History:  Procedure Laterality Date   ABDOMINAL HYSTERECTOMY     BREAST BIOPSY Right    COLONOSCOPY  2021   HAND SURGERY Right    cyst removed   STOMACH SURGERY       OB History   No obstetric history on file.     Family History  Problem Relation Age of Onset   Alcohol abuse Mother    Diabetes Father    Hypertension Sister    Cancer Sister        Throat   Cancer Sister        Breast   Hypertension Brother    Diabetes Brother  Cancer Brother        Prostate   Cancer Brother        Prostate   Cancer Brother        Prostate   Cancer Brother        Prostate   Stroke Daughter     Social History   Tobacco Use   Smoking status: Never   Smokeless tobacco: Never  Vaping Use   Vaping Use: Never used  Substance Use Topics   Alcohol use: Yes    Comment: occasional   Drug use: Not Currently    Home Medications Prior to Admission medications   Medication Sig Start Date End Date Taking? Authorizing Provider  alendronate  (FOSAMAX) 70 MG tablet Take 1 tablet (70 mg total) by mouth every 7 (seven) days. Take with a full glass of water on an empty stomach. 03/24/21  Yes Haydee Salter, MD  aspirin EC 81 MG tablet Take 81 mg by mouth daily.   Yes [provider]  fluticasone (FLONASE) 50 MCG/ACT nasal spray Place 2 sprays into both nostrils daily. 04/29/21  Yes [provider]  glipiZIDE (GLUCOTROL XL) 5 MG 24 hr tablet Take 1 tablet (5 mg total) by mouth daily with breakfast. 03/08/21  Yes [name], [name], NP  hydrochlorothiazide (HYDRODIURIL) 25 MG tablet Take 0.5 tablets (12.5 mg total) by mouth daily. 02/18/21  Yes [name], [name], NP  insulin isophane & regular human KwikPen (NOVOLIN 70/30 KWIKPEN) (70-30) 100 UNIT/ML KwikPen Inject 14 units into the skin before breakfast and 20 units into skin before supper 06/27/21  Yes Haydee Salter, MD  losartan (COZAAR) 25 MG tablet Take 1 tablet (25 mg total) by mouth daily. 11/30/20  Yes [name], [name], NP  mirabegron ER (MYRBETRIQ) 50 MG TB24 tablet Take 50 mg by mouth daily.   Yes [provider]  Multiple Vitamin (MULTI-VITAMIN DAILY PO) Take 1 tablet by mouth daily.   Yes [provider]  omeprazole (PRILOSEC) 40 MG capsule Take 1 capsule (40 mg total) by mouth 2 (two) times daily. 03/07/21  Yes [name], [name], NP  potassium chloride SA (KLOR-CON) 20 MEQ tablet Take 1 tablet (20 mEq total) by mouth daily. 11/30/20  Yes [name], [name], NP  pyridostigmine (MESTINON) 60 MG tablet Take 0.5 tablets (30 mg total) by mouth 3 (three) times daily. 02/11/21  Yes [name], [name], NP  simvastatin (ZOCOR) 40 MG tablet Take 1.5 tablets (60 mg total) by mouth daily at 6 PM. 06/27/21  Yes Rudd, Lillette Boxer, MD  Vitamin D, Cholecalciferol, 10 MCG (400 UNIT) CAPS Take 2 capsules by mouth daily.   Yes [provider]  Blood Glucose Calibration (ACCU-CHEK AVIVA) SOLN USE AS DIRECTED 06/12/18   [provider]  Blood  Glucose Monitoring Suppl (ACCU-CHEK AVIVA) device Use daily to check blood sugar daily 08/14/18   [provider]  Insulin Pen Needle (BD PEN NEEDLE NANO 2ND GEN) 32G X 4 MM MISC USE TO INJECT INSULIN TWICE DAILY 03/24/21   Haydee Salter, MD  Lancet Device MISC For use with Onetouch Ultra Lancets to check blood sugar twice daily 03/07/21   [name], [name], NP  Lancets (ONETOUCH ULTRASOFT) lancets FOR USE WITH ONETOUCH ULTRA LANCETS TO CHECK BLOOD SUGAR TWICE DAILY. 04/26/21   [name], [name], NP  ONETOUCH ULTRA test strip USE AS DIRECTED EVERY MORNING AND EVERY NIGHT AT BEDTIME 05/05/21   [name], [name], NP    Allergies  Celexa [citalopram hydrobromide], Metformin hcl, Montelukast, Tetanus toxoids, and Alendronate  Review of Systems   Review of Systems  Constitutional:  Positive for fatigue.  HENT: Negative.    Respiratory:  Positive for cough.   Cardiovascular: Negative.   Gastrointestinal:        Seen recently for rectal incontinence and told that she likely had fecal impaction  Endocrine: Negative.   Genitourinary: Negative.   Musculoskeletal: Negative.   Allergic/Immunologic: Negative.   Neurological:  Positive for dizziness and light-headedness.  Hematological: Negative.   Psychiatric/Behavioral: Negative.    All other systems reviewed and are negative.  Physical Exam Updated Vital Signs BP (!) 127/52   Pulse 70   Temp 98.2 F (36.8 C) (Oral)   Resp 19   Ht 1.753 m (5' 9" )   Wt 78.9 kg   SpO2 100%   BMI 25.70 kg/m   Physical Exam Vitals and nursing note reviewed.  Constitutional:      General: She is not in acute distress.    Appearance: Normal appearance.  HENT:     Head: Normocephalic.     Right Ear: External ear normal.     Left Ear: External ear normal.     Nose: Nose normal.     Mouth/Throat:     Mouth: Mucous membranes are moist.  Eyes:     Pupils: Pupils are equal, round, and reactive to light.  Cardiovascular:     Rate and  Rhythm: Normal rate and regular rhythm.     Pulses: Normal pulses.  Pulmonary:     Effort: Pulmonary effort is normal.     Breath sounds: Normal breath sounds.  Abdominal:     General: Abdomen is flat.     Palpations: Abdomen is soft.  Musculoskeletal:        General: Normal range of motion.     Cervical back: Normal range of motion.  Skin:    General: Skin is warm.     Capillary Refill: Capillary refill takes less than 2 seconds.  Neurological:     General: No focal deficit present.     Mental Status: She is alert and oriented to person, place, and time.     Cranial Nerves: No cranial nerve deficit.     Sensory: No sensory deficit.     Motor: Weakness present.     Coordination: Coordination normal.     Deep Tendon Reflexes: Reflexes normal.     Comments: Mild diffuse generalized weakness no lateralized deficits  Psychiatric:        Mood and Affect: Mood normal.    ED Results / Procedures / Treatments   Labs (all labs ordered are listed, but only abnormal results are displayed) Labs Reviewed  CBC - Abnormal; Notable for the following components:      Result Value   RBC 5.32 (*)    Hemoglobin 11.6 (*)    MCV 70.3 (*)    MCH 21.8 (*)    RDW 15.7 (*)    All other components within normal limits  COMPREHENSIVE METABOLIC PANEL - Abnormal; Notable for the following components:   Glucose, Bld 282 (*)    Creatinine, Ser 1.01 (*)    Albumin 3.2 (*)    GFR, Estimated 58 (*)    All other components within normal limits  CBG MONITORING, ED - Abnormal; Notable for the following components:   Glucose-Capillary 209 (*)    All other components within normal limits  CBG MONITORING, ED - Abnormal; Notable for the  following components:   Glucose-Capillary 260 (*)    All other components within normal limits  CBG MONITORING, ED - Abnormal; Notable for the following components:   Glucose-Capillary 138 (*)    All other components within normal limits  RESP PANEL BY RT-PCR (FLU A&B,  COVID) ARPGX2  TSH  TROPONIN I (HIGH SENSITIVITY)  TROPONIN I (HIGH SENSITIVITY)    EKG EKG Interpretation  Date/Time:  Saturday August 06 2021 15:03:58 EST Ventricular Rate:  74 PR Interval:  167 QRS Duration: 83 QT Interval:  414 QTC Calculation: 460 R Axis:   -38 Text Interpretation: Sinus rhythm Left axis deviation Anteroseptal infarct, age indeterminate Confirmed by Pattricia Boss [phone]) on 08/06/2021 7:28:25 PM  Radiology CT Head Wo Contrast  Result Date: 08/06/2021 CLINICAL DATA:  Syncopal episode, seizure like activity EXAM: CT HEAD WITHOUT CONTRAST TECHNIQUE: Contiguous axial images were obtained from the base of the skull through the vertex without intravenous contrast. COMPARISON:  06/24/2019 FINDINGS: Brain: No evidence of acute infarction, hemorrhage, hydrocephalus, extra-axial collection or mass lesion/mass effect. Vascular: No hyperdense vessel or unexpected calcification. Skull: Normal. Negative for fracture or focal lesion. Sinuses/Orbits: No acute finding. Other: None. IMPRESSION: No acute intracranial pathology. Electronically Signed   By: Delanna Ahmadi M.D.   On: 08/06/2021 18:16   MR BRAIN WO CONTRAST  Result Date: 08/07/2021 CLINICAL DATA:  75 year old female with recurrent syncope. EXAM: MRI HEAD WITHOUT CONTRAST TECHNIQUE: Multiplanar, multiecho pulse sequences of the brain and surrounding structures were obtained without intravenous contrast. COMPARISON:  Head CT 08/06/2021. FINDINGS: Brain: Cerebral volume is within normal limits for age. No restricted diffusion to suggest acute infarction. Relatively large, round, mildly lobulated extra-axial mass at the left middle cranial fossa appears to be arising at or near the low bony left clinoid process and measures up to 29 mm diameter (series 10, image 13 and series 17 image 20. The lesion is isointense to gray matter on T1 and T2 imaging, and mildly displaces or possibly encases the left ICA terminus and left MCA M1  segment (series 10, image 13 and series 9, image 16). However, only mild regional mass effect and no associated cerebral edema. The left cavernous sinus appears relatively spared on this noncontrast exam. However, the lesion does appear to track to the midline at the anterior sella or planum sphenoidale (series 17, image 21 and series 9, image 13. Pituitary appears only mildly affected. But no midline shift. No ventriculomegaly, or acute intracranial hemorrhage. Cervicomedullary junction is within normal limits. Largely normal for age gray and white matter signal throughout the brain. Minimal to mild scattered subcortical white matter T2 and FLAIR hyperintensity, nonspecific. No cortical encephalomalacia. No chronic cerebral blood products identified. Deep gray matter nuclei, brainstem and cerebellum are normal for age. Vascular: Major intracranial vascular flow voids are preserved. Skull and upper cervical spine: Mild degenerative spinal stenosis suspected at C4-C5 related to disc bulging. Visualized bone marrow signal is within normal limits. Sinuses/Orbits: Left optic chiasm abutted by the extra-axial mass, and the left optic nerve might traverse the mass on series 17, image 21. But the left orbital apex appears spared. Intraorbital soft tissues appear normal. Paranasal Visualized paranasal sinuses and mastoids are clear. Other: Visible internal auditory structures appear normal. Negative visible scalp and face. IMPRESSION: 1. Relatively large, 2.9 cm lobulated extra-axial mass centered at the medial left middle cranial fossa. Isointensity to brain on both CT and MRI strongly suggests Meningioma. And there is no associated cerebral edema. Follow-up post-contrast brain MRI recommended  to confirm and further characterize. But on this noncontrast exam it appears the lesion engulfs the left ICA terminus and MCA, probably tracks to the anterior sella/planum sphenoidale, but relatively spares the cavernous sinus and  left orbital apex. 2. No other acute intracranial abnormality. And otherwise largely normal for age noncontrast MRI appearance of the brain. Electronically Signed   By: Genevie Ann M.D.   On: 08/07/2021 05:47   DG Chest Port 1 View  Result Date: 08/06/2021 CLINICAL DATA:  Syncope, seizure EXAM: PORTABLE CHEST 1 VIEW COMPARISON:  07/23/2020 FINDINGS: The heart size and mediastinal contours are within normal limits. Both lungs are clear. The visualized skeletal structures are unremarkable. IMPRESSION: No active disease. Electronically Signed   By: Randa Ngo M.D.   On: 08/06/2021 18:20    Procedures Procedures   Medications Ordered in ED Medications  chlorhexidine gluconate (MEDLINE KIT) (PERIDEX) 0.12 % solution 15 mL (15 mLs Mouth Rinse Not Given 08/07/21 0758)  MEDLINE mouth rinse (15 mLs Mouth Rinse Not Given 08/07/21 0956)  0.9 %  sodium chloride infusion (75 mL/hr Intravenous IV Pump Association 08/06/21 2259)  enoxaparin (LOVENOX) injection 40 mg (40 mg Subcutaneous Given 08/06/21 2215)  LORazepam (ATIVAN) injection 1 mg (has no administration in time range)  insulin glargine-yfgn (SEMGLEE) injection 10 Units (10 Units Subcutaneous Given 08/07/21 0953)  insulin aspart (novoLOG) injection 0-15 Units (2 Units Subcutaneous Given 08/07/21 0843)  aspirin EC tablet 81 mg (81 mg Oral Given 08/07/21 0951)  simvastatin (ZOCOR) tablet 60 mg (has no administration in time range)  pyridostigmine (MESTINON) tablet 30 mg (30 mg Oral Given 08/07/21 0952)  hydrochlorothiazide (HYDRODIURIL) tablet 12.5 mg (12.5 mg Oral Given 08/07/21 0951)  losartan (COZAAR) tablet 25 mg (25 mg Oral Given 08/07/21 0952)  pantoprazole (PROTONIX) EC tablet 40 mg (40 mg Oral Given 08/07/21 0952)  potassium chloride SA (KLOR-CON) CR tablet 20 mEq (20 mEq Oral Given 08/07/21 0951)  cholecalciferol (VITAMIN D3) tablet 1,000 Units (1,000 Units Oral Given 08/07/21 0952)  fluticasone furoate-vilanterol (BREO ELLIPTA) 100-25  MCG/ACT 1 puff (1 puff Inhalation Given 08/07/21 0950)    ED Course  I have reviewed the triage vital signs and the nursing notes.  Pertinent labs & imaging results that were available during my care of the patient were reviewed by me and considered in my medical decision making (see chart for details).  Clinical Course as of 08/07/21 1053  Sat Aug 06, 2021  1930 Patient presents today with loss of consciousness for several minutes.  EKG without acute ischemic changes and first troponin negative.  Head CT obtained and without evidence of acute abnormalities.  Patient with history of hypertension and diabetes.  She is high risk with syncope.  Given history and loss of bladder control could also have been seizure.  Did have a little confusion afterwards but had a nonfocal neurological evaluation here in the ED.  Plan admission for ongoing evaluation and treatment. [DR]    Clinical Course User Index [DR] Pattricia Boss, MD   MDM Rules/Calculators/A&P                         Discussed with admitting provider team.  Dr. Flossie Buffy , [facility], will see patient for admission Final Clinical Impression(s) / ED Diagnoses Final diagnoses:  Syncope and collapse    Rx / DC Orders ED Discharge Orders     None        Pattricia Boss, MD 08/07/21 1053

## 2021-08-06 NOTE — H&P (Signed)
History and Physical    Candice Pineda ZLD:[id number] DOB: 31-Jan-1946 DOA: 08/06/2021  PCP: Haydee Salter, MD  Patient coming from: Home  I have personally briefly reviewed patient's old medical records in [facility]  Chief Complaint: Seizure like activity  HPI: Candice Pineda is a 75 y.o. female with medical history significant for Orthostatic hypotension, bronchiectasis, history of GIST, insulin-dependent type 2 diabetes, osteoporosis anxiety who presents with concerns of seizure-like activity.  Patient reports that earlier today she was at a family gathering.  Following her meal, her head started to feel hot and she became lightheaded and dizzy.  Had to rest her head on the table and her family tried to help her to the couch.  She reports that she was conscious and aware of everything that was going on although her family reports jerking of her left upper extremity which she cannot remember. Had facial drooping briefly. she also had urinary incontinence which is atypical for her.  Occasionally she does feel chest pain and palpitations.  She has history of syncope years ago with similar symptoms and urinary incontinence. Reportedly was told no cause could be found.  She was seen by neurology back in 2021 and diagnosed with orthostatic hypotension and placed on pyridostigmine which she still takes. Still has occasionally dizziness when she gets out of bed too quickly. She denies any history of previous seizures or head trauma.  No history of strokes.  Only new medication recently was a change in her inhaler by pulmonology which she started yesterday. She denies tobacco use and had a taste of alcohol today at her family gathering.  Denies any recreational drug use.  ED Course: She was afebrile, normotensive on room air.  No leukocytosis, hemoglobin of 11.6.  Sodium of 137, K of 4.3, creatinine of 1.01, BG of 282. Troponin was reassuring at 3.  EKG showing normal sinus rhythm with no specific T  wave or ST changes. CT head was negative.  Chest x-ray negative. Negative for flu and COVID PCR.  Review of Systems: Constitutional: No Weight Change, No Fever ENT/Mouth: No sore throat, No Rhinorrhea Eyes: No Eye Pain, No Vision Changes Cardiovascular: No Chest Pain, no SOB Respiratory: +chronic Cough, + Sputum Gastrointestinal: No Nausea, No Vomiting, No Diarrhea, No Constipation, No Pain Genitourinary: + acute Urinary Incontinence Musculoskeletal: No Arthralgias, No Myalgias Skin: No Skin Lesions, No Pruritus, Neuro: no Weakness, No Numbness,  No Loss of Consciousness, +Syncope Psych: No Anxiety/Panic, No Depression, no decrease appetite Heme/Lymph: No Bruising, No Bleeding  Past Medical History:  Diagnosis Date   Allergy    Bronchiectasis ([place])    Diabetes mellitus without complication ([place])    Hypertension     Past Surgical History:  Procedure Laterality Date   ABDOMINAL HYSTERECTOMY     BREAST BIOPSY Right    COLONOSCOPY  2021   HAND SURGERY Right    cyst removed   STOMACH SURGERY     Pt lives alone but has family close by  reports that she has never smoked. She has never used smokeless tobacco. She reports current alcohol use. She reports that she does not currently use drugs. Social History  Allergies  Allergen Reactions   Celexa [Citalopram Hydrobromide] Other (See Comments)    Drowsiness   Metformin Hcl Diarrhea   Montelukast     Other reaction(s): Other (See Comments) Dizziness Dizziness    Tetanus Toxoids     Arm extremely painful, arm turned red and swollen   Alendronate Nausea  Only    And loose stools. Patient states that she can and wants to continue this medication at this time. And loose stools. Patient states that she can and wants to continue this medication at this time.     Family History  Problem Relation Age of Onset   Alcohol abuse Mother    Diabetes Father    Hypertension Sister    Cancer Sister        Throat   Cancer Sister         Breast   Hypertension Brother    Diabetes Brother    Cancer Brother        Prostate   Cancer Brother        Prostate   Cancer Brother        Prostate   Cancer Brother        Prostate   Stroke Daughter      Prior to Admission medications   Medication Sig Start Date End Date Taking? Authorizing Provider  alendronate (FOSAMAX) 70 MG tablet Take 1 tablet (70 mg total) by mouth every 7 (seven) days. Take with a full glass of water on an empty stomach. 03/24/21  Yes Haydee Salter, MD  aspirin EC 81 MG tablet Take 81 mg by mouth daily.   Yes [provider]  fluticasone (FLONASE) 50 MCG/ACT nasal spray Place 2 sprays into both nostrils daily. 04/29/21  Yes [provider]  glipiZIDE (GLUCOTROL XL) 5 MG 24 hr tablet Take 1 tablet (5 mg total) by mouth daily with breakfast. 03/08/21  Yes [name], [name], NP  hydrochlorothiazide (HYDRODIURIL) 25 MG tablet Take 0.5 tablets (12.5 mg total) by mouth daily. 02/18/21  Yes [name], [name], NP  insulin isophane & regular human KwikPen (NOVOLIN 70/30 KWIKPEN) (70-30) 100 UNIT/ML KwikPen Inject 14 units into the skin before breakfast and 20 units into skin before supper 06/27/21  Yes Haydee Salter, MD  losartan (COZAAR) 25 MG tablet Take 1 tablet (25 mg total) by mouth daily. 11/30/20  Yes [name], [name], NP  mirabegron ER (MYRBETRIQ) 50 MG TB24 tablet Take 50 mg by mouth daily.   Yes [provider]  Multiple Vitamin (MULTI-VITAMIN DAILY PO) Take 1 tablet by mouth daily.   Yes [provider]  omeprazole (PRILOSEC) 40 MG capsule Take 1 capsule (40 mg total) by mouth 2 (two) times daily. 03/07/21  Yes [name], [name], NP  potassium chloride SA (KLOR-CON) 20 MEQ tablet Take 1 tablet (20 mEq total) by mouth daily. 11/30/20  Yes [name], [name], NP  pyridostigmine (MESTINON) 60 MG tablet Take 0.5 tablets (30 mg total) by mouth 3 (three) times daily. 02/11/21  Yes [name], [name], NP  simvastatin  (ZOCOR) 40 MG tablet Take 1.5 tablets (60 mg total) by mouth daily at 6 PM. 06/27/21  Yes Rudd, Lillette Boxer, MD  Vitamin D, Cholecalciferol, 10 MCG (400 UNIT) CAPS Take 2 capsules by mouth daily.   Yes [provider]  Blood Glucose Calibration (ACCU-CHEK AVIVA) SOLN USE AS DIRECTED 06/12/18   [provider]  Blood Glucose Monitoring Suppl (ACCU-CHEK AVIVA) device Use daily to check blood sugar daily 08/14/18   [provider]  Insulin Pen Needle (BD PEN NEEDLE NANO 2ND GEN) 32G X 4 MM MISC USE TO INJECT INSULIN TWICE DAILY 03/24/21   Haydee Salter, MD  Lancet Device MISC For use with Onetouch Ultra Lancets to check blood sugar twice daily 03/07/21   [name], [name],  NP  Lancets (ONETOUCH ULTRASOFT) lancets FOR USE WITH ONETOUCH ULTRA LANCETS TO CHECK BLOOD SUGAR TWICE DAILY. 04/26/21   [name], [name], NP  ONETOUCH ULTRA test strip USE AS DIRECTED EVERY MORNING AND EVERY NIGHT AT BEDTIME 05/05/21   Flossie Buffy, NP    Physical Exam: Vitals:   08/06/21 1835 08/06/21 1945 08/06/21 2000 08/06/21 2015  BP: (!) 107/58 (!) 109/43 (!) 110/56 (!) 147/90  Pulse: 79 70 71 79  Resp: 17 17 17 19   Temp:      TempSrc:      SpO2: 98% 99% 100% 100%  Weight:      Height:        Constitutional: NAD, calm, comfortable elderly female laying flat in bed Vitals:   08/06/21 1835 08/06/21 1945 08/06/21 2000 08/06/21 2015  BP: (!) 107/58 (!) 109/43 (!) 110/56 (!) 147/90  Pulse: 79 70 71 79  Resp: 17 17 17 19   Temp:      TempSrc:      SpO2: 98% 99% 100% 100%  Weight:      Height:       Eyes: PERRL, lids and conjunctivae normal ENMT: Mucous membranes are moist.  Neck: normal, supple Respiratory: clear to auscultation bilaterally, no wheezing, no crackles. Normal respiratory effort. No accessory muscle use.  Cardiovascular: Regular rate and rhythm, no murmurs / rubs / gallops. No extremity edema. 2+ pedal pulses. No carotid bruits.  Abdomen: no tenderness, no masses  palpated. No hepatosplenomegaly. Bowel sounds positive.  Musculoskeletal: no clubbing / cyanosis. No joint deformity upper and lower extremities. Good ROM, no contractures. Normal muscle tone.  Skin: no rashes, lesions, ulcers. No induration Neurologic: CN 2-12 grossly intact.  No facial asymmetry.  No tongue trauma or laceration.  Sensation intact,Strength 5/5 in all 4.  Intact heel-to-shin. Psychiatric: Normal judgment and insight. Alert and oriented x 3. Normal mood.    Labs on Admission: I have personally reviewed following labs and imaging studies  CBC: Recent Labs  Lab 08/06/21 1700  WBC 8.7  HGB 11.6*  HCT 37.4  MCV 70.3*  PLT 373   Basic Metabolic Panel: Recent Labs  Lab 08/06/21 1700  NA 137  K 4.3  CL 107  CO2 25  GLUCOSE 282*  BUN 21  CREATININE 1.01*  CALCIUM 8.9   GFR: Estimated Creatinine Clearance: 49.5 mL/min (A) (by C-G formula based on SCr of 1.01 mg/dL (H)). Liver Function Tests: Recent Labs  Lab 08/06/21 1700  AST 28  ALT 19  ALKPHOS 55  BILITOT 0.3  PROT 7.0  ALBUMIN 3.2*   No results for input(s): LIPASE, AMYLASE in the last 168 hours. No results for input(s): AMMONIA in the last 168 hours. Coagulation Profile: No results for input(s): INR, PROTIME in the last 168 hours. Cardiac Enzymes: No results for input(s): CKTOTAL, CKMB, CKMBINDEX, TROPONINI in the last 168 hours. BNP (last 3 results) No results for input(s): PROBNP in the last 8760 hours. HbA1C: No results for input(s): HGBA1C in the last 72 hours. CBG: Recent Labs  Lab 08/06/21 1506  GLUCAP 209*   Lipid Profile: No results for input(s): CHOL, HDL, LDLCALC, TRIG, CHOLHDL, LDLDIRECT in the last 72 hours. Thyroid Function Tests: No results for input(s): TSH, T4TOTAL, FREET4, T3FREE, THYROIDAB in the last 72 hours. Anemia Panel: No results for input(s): VITAMINB12, FOLATE, FERRITIN, TIBC, IRON, RETICCTPCT in the last 72 hours. Urine analysis:    Component Value Date/Time    COLORURINE YELLOW 06/21/2021 0802   APPEARANCEUR CLEAR  06/21/2021 0802   LABSPEC 1.010 06/21/2021 0802   PHURINE 6.0 06/21/2021 0802   GLUCOSEU NEGATIVE 06/21/2021 0802   HGBUR NEGATIVE 06/21/2021 0802   BILIRUBINUR NEGATIVE 06/21/2021 0802   KETONESUR NEGATIVE 06/21/2021 0802   PROTEINUR NEGATIVE 08/17/2020 1051   UROBILINOGEN 0.2 06/21/2021 0802   NITRITE NEGATIVE 06/21/2021 0802   LEUKOCYTESUR TRACE (A) 06/21/2021 0802    Radiological Exams on Admission: CT Head Wo Contrast  Result Date: 08/06/2021 CLINICAL DATA:  Syncopal episode, seizure like activity EXAM: CT HEAD WITHOUT CONTRAST TECHNIQUE: Contiguous axial images were obtained from the base of the skull through the vertex without intravenous contrast. COMPARISON:  06/24/2019 FINDINGS: Brain: No evidence of acute infarction, hemorrhage, hydrocephalus, extra-axial collection or mass lesion/mass effect. Vascular: No hyperdense vessel or unexpected calcification. Skull: Normal. Negative for fracture or focal lesion. Sinuses/Orbits: No acute finding. Other: None. IMPRESSION: No acute intracranial pathology. Electronically Signed   By: Delanna Ahmadi M.D.   On: 08/06/2021 18:16   DG Chest Port 1 View  Result Date: 08/06/2021 CLINICAL DATA:  Syncope, seizure EXAM: PORTABLE CHEST 1 VIEW COMPARISON:  07/23/2020 FINDINGS: The heart size and mediastinal contours are within normal limits. Both lungs are clear. The visualized skeletal structures are unremarkable. IMPRESSION: No active disease. Electronically Signed   By: Randa Ngo M.D.   On: 08/06/2021 18:20      Assessment/Plan  Syncope/Seizure-like activity -Patient has history of recurrent syncope with similar symptoms of head flushing and urinary incontinence years ago.  Family at this time also noted jerking of the left upper extremity.  Symptoms do seem concerning for possible seizure.  However, she also endorse intermittent palpitations which also raise suspicion of cardiogenic  source -CT head thus far negative.  Obtain MRI brain and EEG -Obtain echocardiogram and keep on continuous telemetry.  Might need to consider long-term cardiac monitoring if no cause could be found during this admission -Obtain TSH -check orthostatic vital signs  History of orthostatic hypotension - Continue pyridostigmine  Bronchietasis  -continue bronchodilator  Hypertension Continue HCTZ, losartan  Insulin-dependent type 2 diabetes - Home regimen includes Novolin 70/30 14 units in the AM and 20 units at PM -start semglee 10 units BID with moderate SSI   DVT prophylaxis:.Lovenox Code Status: Full Family Communication: Plan discussed with patient at bedside  disposition Plan: Home with observation Consults called:  Admission status: Observation  Level of care: Telemetry Cardiac  Status is: Observation  The patient remains OBS appropriate and will d/c before 2 midnights.        Orene Desanctis DO [facility]   If 7PM-7AM, please contact night-coverage [email or web address]   08/06/2021, 8:38 PM

## 2021-08-06 NOTE — ED Notes (Signed)
Patient transported to CT 

## 2021-08-06 NOTE — ED Notes (Signed)
Pt ambulatory to restroom with standby assist

## 2021-08-07 ENCOUNTER — Inpatient Hospital Stay (HOSPITAL_COMMUNITY): Payer: HMO

## 2021-08-07 ENCOUNTER — Observation Stay (HOSPITAL_COMMUNITY): Payer: HMO

## 2021-08-07 ENCOUNTER — Encounter (HOSPITAL_COMMUNITY): Payer: Self-pay | Admitting: Family Medicine

## 2021-08-07 DIAGNOSIS — Z8042 Family history of malignant neoplasm of prostate: Secondary | ICD-10-CM | POA: Diagnosis not present

## 2021-08-07 DIAGNOSIS — Z887 Allergy status to serum and vaccine status: Secondary | ICD-10-CM | POA: Diagnosis not present

## 2021-08-07 DIAGNOSIS — Z833 Family history of diabetes mellitus: Secondary | ICD-10-CM | POA: Diagnosis not present

## 2021-08-07 DIAGNOSIS — R569 Unspecified convulsions: Secondary | ICD-10-CM | POA: Diagnosis present

## 2021-08-07 DIAGNOSIS — E869 Volume depletion, unspecified: Secondary | ICD-10-CM | POA: Diagnosis present

## 2021-08-07 DIAGNOSIS — Z20822 Contact with and (suspected) exposure to covid-19: Secondary | ICD-10-CM | POA: Diagnosis present

## 2021-08-07 DIAGNOSIS — Z803 Family history of malignant neoplasm of breast: Secondary | ICD-10-CM | POA: Diagnosis not present

## 2021-08-07 DIAGNOSIS — E1165 Type 2 diabetes mellitus with hyperglycemia: Secondary | ICD-10-CM | POA: Diagnosis present

## 2021-08-07 DIAGNOSIS — R55 Syncope and collapse: Secondary | ICD-10-CM | POA: Diagnosis not present

## 2021-08-07 DIAGNOSIS — Z794 Long term (current) use of insulin: Secondary | ICD-10-CM | POA: Diagnosis not present

## 2021-08-07 DIAGNOSIS — D329 Benign neoplasm of meninges, unspecified: Secondary | ICD-10-CM | POA: Diagnosis present

## 2021-08-07 DIAGNOSIS — Z8249 Family history of ischemic heart disease and other diseases of the circulatory system: Secondary | ICD-10-CM | POA: Diagnosis not present

## 2021-08-07 DIAGNOSIS — F458 Other somatoform disorders: Secondary | ICD-10-CM | POA: Diagnosis present

## 2021-08-07 DIAGNOSIS — G9389 Other specified disorders of brain: Secondary | ICD-10-CM | POA: Diagnosis not present

## 2021-08-07 DIAGNOSIS — Z811 Family history of alcohol abuse and dependence: Secondary | ICD-10-CM | POA: Diagnosis not present

## 2021-08-07 DIAGNOSIS — Z85831 Personal history of malignant neoplasm of soft tissue: Secondary | ICD-10-CM | POA: Diagnosis not present

## 2021-08-07 DIAGNOSIS — Z823 Family history of stroke: Secondary | ICD-10-CM | POA: Diagnosis not present

## 2021-08-07 DIAGNOSIS — E860 Dehydration: Secondary | ICD-10-CM | POA: Diagnosis present

## 2021-08-07 DIAGNOSIS — R32 Unspecified urinary incontinence: Secondary | ICD-10-CM | POA: Diagnosis present

## 2021-08-07 DIAGNOSIS — R29810 Facial weakness: Secondary | ICD-10-CM | POA: Diagnosis present

## 2021-08-07 DIAGNOSIS — J479 Bronchiectasis, uncomplicated: Secondary | ICD-10-CM | POA: Diagnosis present

## 2021-08-07 DIAGNOSIS — Z888 Allergy status to other drugs, medicaments and biological substances status: Secondary | ICD-10-CM | POA: Diagnosis not present

## 2021-08-07 DIAGNOSIS — I10 Essential (primary) hypertension: Secondary | ICD-10-CM | POA: Diagnosis present

## 2021-08-07 DIAGNOSIS — M810 Age-related osteoporosis without current pathological fracture: Secondary | ICD-10-CM | POA: Diagnosis present

## 2021-08-07 DIAGNOSIS — Z808 Family history of malignant neoplasm of other organs or systems: Secondary | ICD-10-CM | POA: Diagnosis not present

## 2021-08-07 DIAGNOSIS — I951 Orthostatic hypotension: Secondary | ICD-10-CM | POA: Diagnosis present

## 2021-08-07 LAB — ECHOCARDIOGRAM COMPLETE
Area-P 1/2: 3.4 cm2
Height: 69 in
S' Lateral: 1.9 cm
Weight: 2784 oz

## 2021-08-07 LAB — TSH: TSH: 1.081 u[IU]/mL (ref 0.350–4.500)

## 2021-08-07 LAB — GLUCOSE, CAPILLARY
Glucose-Capillary: 180 mg/dL — ABNORMAL HIGH (ref 70–99)
Glucose-Capillary: 206 mg/dL — ABNORMAL HIGH (ref 70–99)

## 2021-08-07 LAB — CBG MONITORING, ED
Glucose-Capillary: 138 mg/dL — ABNORMAL HIGH (ref 70–99)
Glucose-Capillary: 201 mg/dL — ABNORMAL HIGH (ref 70–99)

## 2021-08-07 MED ORDER — GADOBUTROL 1 MMOL/ML IV SOLN
8.0000 mL | Freq: Once | INTRAVENOUS | Status: AC | PRN
  Administered 2021-08-07: 8 mL via INTRAVENOUS

## 2021-08-07 NOTE — Progress Notes (Signed)
PROGRESS NOTE    Candice Pineda  QMV:[id number] DOB: 1946-01-06 DOA: 08/06/2021 PCP: Haydee Salter, MD    Chief Complaint  Patient presents with   Loss of Consciousness   Seizures    Brief Narrative:  Candice Pineda is a 75 y.o. female with medical history significant for Orthostatic hypotension, bronchiectasis, history of GIST, insulin-dependent type 2 diabetes, osteoporosis anxiety who presents with concerns of seizure-like activity.  Found to have a possible meningioma, neurosurgery consulted, recommending MRI with and without contrast Possible discharge home on 11/21 after MRI and cleared by neurosurgery  Subjective:  She reports feeling better, no dizziness at rest, but report dizziness when standing up this morning She denies pain  Assessment & Plan:   Principal Problem:   Seizure-like activity ([place]) Active Problems:   Essential hypertension   Type 2 diabetes mellitus ([place])   Orthostatic hypotension   Bronchiectasis ([place])   2.9cm lobulated extra-axial mass centered at the medial left middle cranial fossa, no associated cerebral edema But on this noncontrast exam it appears the lesion engulfs the left ICA terminus and MCA, probably tracks to the anterior sella/planum sphenoidale, but relatively spares the cavernous sinus and left orbital apex. Case discussed with neurosurgery who will see patient in consult  Seizure-like activity /syncope Report episode after eating lunch Patient reports was not able to talk during the episode, but she denies loss of consciousness On seizure precaution Routine EEG no seizure or epileptiform discharges seen Orthostatic vital signs unremarkable, though she reports felt dizzy when standing up this morning, will hold HCTZ, appears slightly dehydrated , continue hydration ,repeat orthostatic in the morning EKG/Tele no arrhythmia identified Echocardiogram unremarkable Postprandial syncope possible related to autonomic dysfunction in  diabetes outpatient cardiac monitoring request made through epic  Chronic orthostatic hypotension Seen by neurology, on pyridostigmine chronically  Insulin-dependent type 2 diabetes, uncontrolled with hyperglycemia A1c 8.3 in 06/21/2021 Continue insulin, adjust as needed  Hypertension Continue Cozaar, hold HCTZ due to tendency of orthostatic hypotension     Nutritional Assessment:  The patient's BMI is: Body mass index is 25.7 kg/m.Marland Kitchen  Seen by dietician.  I agree with the assessment and plan as outlined below:  Nutrition Status:           Unresulted Labs (From admission, onward)     Start     Ordered   08/08/21 2841  Basic metabolic panel  Tomorrow morning,   R        08/07/21 1544   08/08/21 0500  Magnesium  Tomorrow morning,   STAT        08/07/21 1544              DVT prophylaxis: enoxaparin (LOVENOX) injection 40 mg Start: 08/06/21 2015   Code Status: Full Family Communication: Daughter over the phone Disposition:   Status is: Inpatient   Dispo: The patient is from: Home              Anticipated d/c is to: Home              Anticipated d/c date is: Likely 11/21, after MRI brain and clearance by neurosurgeon                Consultants:  Neurosurgery  Procedures:  None  Antimicrobials:    None    Objective: Vitals:   08/07/21 0900 08/07/21 1000 08/07/21 1343 08/07/21 1344  BP: (!) 129/53 (!) 127/52  94/64  Pulse: 62 70  76  Resp: 18 19  16  Temp:   98.1 F (36.7 C)   TempSrc:      SpO2: 100% 100%  100%  Weight:      Height:       No intake or output data in the 24 hours ending 08/07/21 1546 Filed Weights   08/06/21 1510  Weight: 78.9 kg    Examination:  General exam: alert, awake, communicative,calm, NAD Respiratory system: Clear to auscultation. Respiratory effort normal. Cardiovascular system:  RRR.  Gastrointestinal system: Abdomen is nondistended, soft and nontender.  Normal bowel sounds heard. Central nervous  system: Alert and oriented. No focal neurological deficits. Extremities:  no edema Skin: No rashes, lesions or ulcers Psychiatry: Judgement and insight appear normal. Mood & affect appropriate.     Data Reviewed: I have personally reviewed following labs and imaging studies  CBC: Recent Labs  Lab 08/06/21 1700  WBC 8.7  HGB 11.6*  HCT 37.4  MCV 70.3*  PLT 476    Basic Metabolic Panel: Recent Labs  Lab 08/06/21 1700  NA 137  K 4.3  CL 107  CO2 25  GLUCOSE 282*  BUN 21  CREATININE 1.01*  CALCIUM 8.9    GFR: Estimated Creatinine Clearance: 49.5 mL/min (A) (by C-G formula based on SCr of 1.01 mg/dL (H)).  Liver Function Tests: Recent Labs  Lab 08/06/21 1700  AST 28  ALT 19  ALKPHOS 55  BILITOT 0.3  PROT 7.0  ALBUMIN 3.2*    CBG: Recent Labs  Lab 08/06/21 1506 08/06/21 2203 08/07/21 0802 08/07/21 1253  GLUCAP 209* 260* 138* 201*     Recent Results (from the past 240 hour(s))  Resp Panel by RT-PCR (Flu A&B, Covid) Nasopharyngeal Swab     Status: None   Collection Time: 08/06/21  3:25 PM   Specimen: Nasopharyngeal Swab; Nasopharyngeal(NP) swabs in vial transport medium  Result Value Ref Range Status   SARS Coronavirus 2 by RT PCR NEGATIVE NEGATIVE Final    Comment: (NOTE) SARS-CoV-2 target nucleic acids are NOT DETECTED.  The SARS-CoV-2 RNA is generally detectable in upper respiratory specimens during the acute phase of infection. The lowest concentration of SARS-CoV-2 viral copies this assay can detect is 138 copies/mL. A negative result does not preclude SARS-Cov-2 infection and should not be used as the sole basis for treatment or other patient management decisions. A negative result may occur with  improper specimen collection/handling, submission of specimen other than nasopharyngeal swab, presence of viral mutation(s) within the areas targeted by this assay, and inadequate number of viral copies(<138 copies/mL). A negative result must be  combined with clinical observations, patient history, and epidemiological information. The expected result is Negative.  Fact Sheet for Patients:  EntrepreneurPulse.com.au  Fact Sheet for Healthcare Providers:  IncredibleEmployment.be  This test is no t yet approved or cleared by the [place] FDA and  has been authorized for detection and/or diagnosis of SARS-CoV-2 by FDA under an Emergency Use Authorization (EUA). This EUA will remain  in effect (meaning this test can be used) for the duration of the COVID-19 declaration under Section 564(b)(1) of the Act, 21 U.S.C.section 360bbb-3(b)(1), unless the authorization is terminated  or revoked sooner.       Influenza A by PCR NEGATIVE NEGATIVE Final   Influenza B by PCR NEGATIVE NEGATIVE Final    Comment: (NOTE) The Xpert Xpress SARS-CoV-2/FLU/RSV plus assay is intended as an aid in the diagnosis of influenza from Nasopharyngeal swab specimens and should not be used as a sole basis for treatment. Nasal washings  and aspirates are unacceptable for Xpert Xpress SARS-CoV-2/FLU/RSV testing.  Fact Sheet for Patients: EntrepreneurPulse.com.au  Fact Sheet for Healthcare Providers: IncredibleEmployment.be  This test is not yet approved or cleared by the [place] FDA and has been authorized for detection and/or diagnosis of SARS-CoV-2 by FDA under an Emergency Use Authorization (EUA). This EUA will remain in effect (meaning this test can be used) for the duration of the COVID-19 declaration under Section 564(b)(1) of the Act, 21 U.S.C. section 360bbb-3(b)(1), unless the authorization is terminated or revoked.  Performed at [facility], [place] [address] [place], [place] [address]          Radiology Studies: CT Head Wo Contrast  Result Date: 08/06/2021 CLINICAL DATA:  Syncopal episode, seizure like activity EXAM: CT HEAD WITHOUT CONTRAST  TECHNIQUE: Contiguous axial images were obtained from the base of the skull through the vertex without intravenous contrast. COMPARISON:  06/24/2019 FINDINGS: Brain: No evidence of acute infarction, hemorrhage, hydrocephalus, extra-axial collection or mass lesion/mass effect. Vascular: No hyperdense vessel or unexpected calcification. Skull: Normal. Negative for fracture or focal lesion. Sinuses/Orbits: No acute finding. Other: None. IMPRESSION: No acute intracranial pathology. Electronically Signed   By: Delanna Ahmadi M.D.   On: 08/06/2021 18:16   MR BRAIN WO CONTRAST  Result Date: 08/07/2021 CLINICAL DATA:  75 year old female with recurrent syncope. EXAM: MRI HEAD WITHOUT CONTRAST TECHNIQUE: Multiplanar, multiecho pulse sequences of the brain and surrounding structures were obtained without intravenous contrast. COMPARISON:  Head CT 08/06/2021. FINDINGS: Brain: Cerebral volume is within normal limits for age. No restricted diffusion to suggest acute infarction. Relatively large, round, mildly lobulated extra-axial mass at the left middle cranial fossa appears to be arising at or near the low bony left clinoid process and measures up to 29 mm diameter (series 10, image 13 and series 17 image 20. The lesion is isointense to gray matter on T1 and T2 imaging, and mildly displaces or possibly encases the left ICA terminus and left MCA M1 segment (series 10, image 13 and series 9, image 16). However, only mild regional mass effect and no associated cerebral edema. The left cavernous sinus appears relatively spared on this noncontrast exam. However, the lesion does appear to track to the midline at the anterior sella or planum sphenoidale (series 17, image 21 and series 9, image 13. Pituitary appears only mildly affected. But no midline shift. No ventriculomegaly, or acute intracranial hemorrhage. Cervicomedullary junction is within normal limits. Largely normal for age gray and white matter signal throughout the  brain. Minimal to mild scattered subcortical white matter T2 and FLAIR hyperintensity, nonspecific. No cortical encephalomalacia. No chronic cerebral blood products identified. Deep gray matter nuclei, brainstem and cerebellum are normal for age. Vascular: Major intracranial vascular flow voids are preserved. Skull and upper cervical spine: Mild degenerative spinal stenosis suspected at C4-C5 related to disc bulging. Visualized bone marrow signal is within normal limits. Sinuses/Orbits: Left optic chiasm abutted by the extra-axial mass, and the left optic nerve might traverse the mass on series 17, image 21. But the left orbital apex appears spared. Intraorbital soft tissues appear normal. Paranasal Visualized paranasal sinuses and mastoids are clear. Other: Visible internal auditory structures appear normal. Negative visible scalp and face. IMPRESSION: 1. Relatively large, 2.9 cm lobulated extra-axial mass centered at the medial left middle cranial fossa. Isointensity to brain on both CT and MRI strongly suggests Meningioma. And there is no associated cerebral edema. Follow-up post-contrast brain MRI recommended to confirm and further characterize. But on  this noncontrast exam it appears the lesion engulfs the left ICA terminus and MCA, probably tracks to the anterior sella/planum sphenoidale, but relatively spares the cavernous sinus and left orbital apex. 2. No other acute intracranial abnormality. And otherwise largely normal for age noncontrast MRI appearance of the brain. Electronically Signed   By: Genevie Ann M.D.   On: 08/07/2021 05:47   DG Chest Port 1 View  Result Date: 08/06/2021 CLINICAL DATA:  Syncope, seizure EXAM: PORTABLE CHEST 1 VIEW COMPARISON:  07/23/2020 FINDINGS: The heart size and mediastinal contours are within normal limits. Both lungs are clear. The visualized skeletal structures are unremarkable. IMPRESSION: No active disease. Electronically Signed   By: Randa Ngo M.D.   On:  08/06/2021 18:20   EEG adult  Result Date: 08/07/2021 Lora Havens, MD     08/07/2021 12:04 PM Patient Name: Kalese Ensz MRN: [id number] Epilepsy Attending: Lora Havens Referring Physician/Provider: Dr Ileene Musa Date: 08/07/2021 Duration: 22.55 mins Patient history: 75yo F with recurrent syncope associated with head flushing and urinary incontinence. EEG to evaluate for seizure Level of alertness: Awake AEDs during EEG study: None Technical aspects: This EEG study was done with scalp electrodes positioned according to the 10-20 International system of electrode placement. Electrical activity was acquired at a sampling rate of 500Hz  and reviewed with a high frequency filter of 70Hz  and a low frequency filter of 1Hz . EEG data were recorded continuously and digitally stored. Description: The posterior dominant rhythm consists of 9-10 Hz activity of moderate voltage (25-35 uV) seen predominantly in posterior head regions, symmetric and reactive to eye opening and eye closing. Hyperventilation and photic stimulation were not performed.   IMPRESSION: This study is within normal limits. No seizures or epileptiform discharges were seen throughout the recording. Lora Havens   ECHOCARDIOGRAM COMPLETE  Result Date: 08/07/2021    ECHOCARDIOGRAM REPORT   Patient Name:   AVIANAH PELLMAN Date of Exam: 08/07/2021 Medical Rec #:  [id number]  Height:       69.0 in Accession #:    [id number] Weight:       174.0 lb Date of Birth:  [birth year]   BSA:          1.947 m Patient Age:    61 years   BP:           129/53 mmHg Patient Gender: F          HR:           75 bpm. Exam Location:  Inpatient Procedure: 2D Echo, Color Doppler and Cardiac Doppler Indications:    R55 Syncope  History:        Patient has prior history of Echocardiogram examinations, most                 recent 04/27/2020. Risk Factors:Hypertension and Diabetes.  Sonographer:    Raquel Sarna Senior RDCS Referring Phys: [id number] [place] [facility]  Sonographer Comments: Very  lateral apical window, images would be improved in echo bed with dropout. IMPRESSIONS  1. Left ventricular ejection fraction, by estimation, is 60 to 65%. The left ventricle has normal function. The left ventricle has no regional wall motion abnormalities. Left ventricular diastolic parameters were normal.  2. Right ventricular systolic function is normal. The right ventricular size is normal. There is normal pulmonary artery systolic pressure.  3. The mitral valve is normal in structure. Trivial mitral valve regurgitation. No evidence of mitral stenosis.  4. The aortic valve is tricuspid. Aortic valve regurgitation  is not visualized. No aortic stenosis is present.  5. The inferior vena cava is normal in size with greater than 50% respiratory variability, suggesting right atrial pressure of 3 mmHg. FINDINGS  Left Ventricle: Left ventricular ejection fraction, by estimation, is 60 to 65%. The left ventricle has normal function. The left ventricle has no regional wall motion abnormalities. The left ventricular internal cavity size was normal in size. There is  no left ventricular hypertrophy. Left ventricular diastolic parameters were normal. Right Ventricle: The right ventricular size is normal. Right ventricular systolic function is normal. There is normal pulmonary artery systolic pressure. The tricuspid regurgitant velocity is 2.13 m/s, and with an assumed right atrial pressure of 3 mmHg,  the estimated right ventricular systolic pressure is 82.4 mmHg. Left Atrium: Left atrial size was normal in size. Right Atrium: Right atrial size was normal in size. Pericardium: There is no evidence of pericardial effusion. Mitral Valve: The mitral valve is normal in structure. Trivial mitral valve regurgitation. No evidence of mitral valve stenosis. Tricuspid Valve: The tricuspid valve is normal in structure. Tricuspid valve regurgitation is mild . No evidence of tricuspid stenosis. Aortic Valve: The aortic valve is tricuspid.  Aortic valve regurgitation is not visualized. No aortic stenosis is present. Pulmonic Valve: The pulmonic valve was normal in structure. Pulmonic valve regurgitation is not visualized. No evidence of pulmonic stenosis. Aorta: The aortic root is normal in size and structure. Venous: The inferior vena cava is normal in size with greater than 50% respiratory variability, suggesting right atrial pressure of 3 mmHg. IAS/Shunts: There is redundancy of the interatrial septum. No atrial level shunt detected by color flow Doppler.  LEFT VENTRICLE PLAX 2D LVIDd:         3.60 cm   Diastology LVIDs:         1.90 cm   LV e' medial:    7.62 cm/s LV PW:         0.90 cm   LV E/e' medial:  10.5 LV IVS:        0.80 cm   LV e' lateral:   9.36 cm/s LVOT diam:     1.90 cm   LV E/e' lateral: 8.6 LV SV:         78 LV SV Index:   40 LVOT Area:     2.84 cm  RIGHT VENTRICLE RV S prime:     13.40 cm/s TAPSE (M-mode): 2.4 cm LEFT ATRIUM             Index        RIGHT ATRIUM           Index LA diam:        2.90 cm 1.49 cm/m   RA Area:     17.90 cm LA Vol (A2C):   61.5 ml 31.58 ml/m  RA Volume:   48.30 ml  24.80 ml/m LA Vol (A4C):   53.4 ml 27.42 ml/m LA Biplane Vol: 60.9 ml 31.27 ml/m  AORTIC VALVE LVOT Vmax:   106.00 cm/s LVOT Vmean:  79.100 cm/s LVOT VTI:    0.275 m  AORTA Ao Root diam: 3.00 cm Ao Asc diam:  3.20 cm MITRAL VALVE               TRICUSPID VALVE MV Area (PHT): 3.40 cm    TR Peak grad:   18.1 mmHg MV Decel Time: 223 msec    TR Vmax:        213.00 cm/s MV E velocity:  80.20 cm/s MV A velocity: 78.60 cm/s  SHUNTS MV E/A ratio:  1.02        Systemic VTI:  0.28 m                            Systemic Diam: 1.90 cm Kirk Ruths MD Electronically signed by Kirk Ruths MD Signature Date/Time: 08/07/2021/1:12:02 PM    Final         Scheduled Meds:  aspirin EC  81 mg Oral Daily   chlorhexidine gluconate (MEDLINE KIT)  15 mL Mouth Rinse BID   cholecalciferol  1,000 Units Oral Daily   enoxaparin (LOVENOX) injection  40 mg  Subcutaneous Q24H   fluticasone furoate-vilanterol  1 puff Inhalation Daily   insulin aspart  0-15 Units Subcutaneous TID WC   insulin glargine-yfgn  10 Units Subcutaneous BID   losartan  25 mg Oral Daily   mouth rinse  15 mL Mouth Rinse 10 times per day   pantoprazole  40 mg Oral Daily   potassium chloride SA  20 mEq Oral Daily   pyridostigmine  30 mg Oral TID   simvastatin  60 mg Oral q1800   Continuous Infusions:  sodium chloride 75 mL/hr (08/06/21 2259)     LOS: 0 days   Time spent: 43mns Greater than 50% of this time was spent in counseling, explanation of diagnosis, planning of further management, and coordination of care.   Voice Recognition /Viviann Sparedictation system was used to create this note, attempts have been made to correct errors. Please contact the author with questions and/or clarifications.   FFlorencia Reasons MD PhD FACP [facility]  Available via Epic secure chat 7am-7pm for nonurgent issues Please page for urgent issues To page the attending provider between 7A-7P or the covering provider during after hours 7P-7A, please log into the web site [email or web address] and access using universal [place] password for that web site. If you do not have the password, please call the hospital operator.    08/07/2021, 3:46 PM

## 2021-08-07 NOTE — ED Notes (Signed)
EEG at bedside.

## 2021-08-07 NOTE — Procedures (Addendum)
Patient Name: Candice Pineda  MRN: [id number]  Epilepsy Attending: Lora Havens  Referring Physician/Provider: Dr Ileene Musa Date: 08/07/2021 Duration: 22.55 mins  Patient history: 75yo F with recurrent syncope associated with head flushing and urinary incontinence. EEG to evaluate for seizure  Level of alertness: Awake  AEDs during EEG study: None  Technical aspects: This EEG study was done with scalp electrodes positioned according to the 10-20 International system of electrode placement. Electrical activity was acquired at a sampling rate of 500Hz  and reviewed with a high frequency filter of 70Hz  and a low frequency filter of 1Hz . EEG data were recorded continuously and digitally stored.   Description: The posterior dominant rhythm consists of 9-10 Hz activity of moderate voltage (25-35 uV) seen predominantly in posterior head regions, symmetric and reactive to eye opening and eye closing. Hyperventilation and photic stimulation were not performed.     IMPRESSION: This study is within normal limits. No seizures or epileptiform discharges were seen throughout the recording.   Graelyn Bihl Barbra Sarks

## 2021-08-07 NOTE — Progress Notes (Signed)
EEG completed, results pending. 

## 2021-08-07 NOTE — Progress Notes (Signed)
Echocardiogram 2D Echocardiogram has been performed.   Oneal Deputy Mackie Goon RDCS 08/07/2021, 10:58 AM

## 2021-08-07 NOTE — ED Notes (Signed)
Patient ambulated to bathroom with minimal assist and steady gait

## 2021-08-07 NOTE — ED Notes (Signed)
Patient transported to MRI 

## 2021-08-07 NOTE — ED Notes (Signed)
Back from MRI.

## 2021-08-07 NOTE — ED Notes (Signed)
MRI called back post EEG, not claustrophobic

## 2021-08-07 NOTE — Consult Note (Signed)
Reason for Consult: Brain mass Referring Physician: Medical team  Candice Pineda is an 75 y.o. female.   HPI:  75 year old female who was brought into the emergency department today by EMS after a syncopal episode and possible seizure involving the left upper extremity.  She has had more than 1 passing out incident.  I think she is being admitted by medicine for work-up.  MRI of the brain without contrast showed a medial sphenoid wing mass consistent with meningioma and neurosurgical evaluation was requested.  Denies headache.  Denies visual changes.  Denies previous seizure activity.  Denies numbness tingling weakness or change in gait.  Past Medical History:  Diagnosis Date   Allergy    Bronchiectasis ([place])    Diabetes mellitus without complication ([place])    Hypertension     Past Surgical History:  Procedure Laterality Date   ABDOMINAL HYSTERECTOMY     BREAST BIOPSY Right    COLONOSCOPY  2021   HAND SURGERY Right    cyst removed   STOMACH SURGERY      Allergies  Allergen Reactions   Celexa [Citalopram Hydrobromide] Other (See Comments)    Drowsiness   Metformin Hcl Diarrhea   Montelukast     Other reaction(s): Other (See Comments) Dizziness Dizziness    Tetanus Toxoids     Arm extremely painful, arm turned red and swollen   Alendronate Nausea Only    And loose stools. Patient states that she can and wants to continue this medication at this time. And loose stools. Patient states that she can and wants to continue this medication at this time.     Social History   Tobacco Use   Smoking status: Never   Smokeless tobacco: Never  Substance Use Topics   Alcohol use: Yes    Comment: occasional    Family History  Problem Relation Age of Onset   Alcohol abuse Mother    Diabetes Father    Hypertension Sister    Cancer Sister        Throat   Cancer Sister        Breast   Hypertension Brother    Diabetes Brother    Cancer Brother        Prostate   Cancer Brother         Prostate   Cancer Brother        Prostate   Cancer Brother        Prostate   Stroke Daughter      Review of Systems  Positive ROS: neg  All other systems have been reviewed and were otherwise negative with the exception of those mentioned in the HPI and as above.  Objective: Vital signs in last 24 hours: Temp:  [97.7 F (36.5 C)-98.2 F (36.8 C)] 98.2 F (36.8 C) (11/20 0230) Pulse Rate:  [61-83] 70 (11/20 1000) Resp:  [13-25] 19 (11/20 1000) BP: (101-147)/(43-99) 127/52 (11/20 1000) SpO2:  [98 %-100 %] 100 % (11/20 1000) Weight:  [78.9 kg] 78.9 kg (11/19 1510)  General Appearance: Alert, cooperative, no distress, appears stated age Head: Normocephalic, without obvious abnormality, atraumatic Eyes: PERRL, conjunctiva/corneas clear, EOM's intact  Throat: benign Neck: Supple, symmetrical, trachea midline Lungs: respirations unlabored Heart: Regular rate and rhythm Abdomen: Soft Extremities: Extremities normal, atraumatic, no cyanosis or edema Pulses: 2+ and symmetric all extremities Skin: Skin color, texture, turgor normal, no rashes or lesions  NEUROLOGIC:   Mental status: A&O x4, no aphasia, good attention span, Memory and fund of knowledge appear  to be appropriate Motor Exam - grossly normal, normal tone and bulk Sensory Exam - grossly normal Reflexes: symmetric, no pathologic reflexes, No Hoffman's, No clonus Coordination - grossly normal Gait -not tested Balance -not tested Cranial Nerves: I: smell Not tested  II: visual acuity  OS: na    OD: na  II: visual fields Full to confrontation  II: pupils Equal, round, reactive to light  III,VII: ptosis None  III,IV,VI: extraocular muscles  Full ROM  V: mastication Normal  V: facial light touch sensation  Normal  V,VII: corneal reflex  Present  VII: facial muscle function - upper  Normal  VII: facial muscle function - lower Normal  VIII: hearing Not tested  IX: soft palate elevation  Normal  IX,X: gag reflex  Present  XI: trapezius strength  5/5  XI: sternocleidomastoid strength 5/5  XI: neck flexion strength  5/5  XII: tongue strength  Normal    Data Review Lab Results  Component Value Date   WBC 8.7 08/06/2021   HGB 11.6 (L) 08/06/2021   HCT 37.4 08/06/2021   MCV 70.3 (L) 08/06/2021   PLT 277 08/06/2021   Lab Results  Component Value Date   NA 137 08/06/2021   K 4.3 08/06/2021   CL 107 08/06/2021   CO2 25 08/06/2021   BUN 21 08/06/2021   CREATININE 1.01 (H) 08/06/2021   GLUCOSE 282 (H) 08/06/2021   No results found for: INR, PROTIME  Radiology: CT Head Wo Contrast  Result Date: 08/06/2021 CLINICAL DATA:  Syncopal episode, seizure like activity EXAM: CT HEAD WITHOUT CONTRAST TECHNIQUE: Contiguous axial images were obtained from the base of the skull through the vertex without intravenous contrast. COMPARISON:  06/24/2019 FINDINGS: Brain: No evidence of acute infarction, hemorrhage, hydrocephalus, extra-axial collection or mass lesion/mass effect. Vascular: No hyperdense vessel or unexpected calcification. Skull: Normal. Negative for fracture or focal lesion. Sinuses/Orbits: No acute finding. Other: None. IMPRESSION: No acute intracranial pathology. Electronically Signed   By: Delanna Ahmadi M.D.   On: 08/06/2021 18:16   MR BRAIN WO CONTRAST  Result Date: 08/07/2021 CLINICAL DATA:  75 year old female with recurrent syncope. EXAM: MRI HEAD WITHOUT CONTRAST TECHNIQUE: Multiplanar, multiecho pulse sequences of the brain and surrounding structures were obtained without intravenous contrast. COMPARISON:  Head CT 08/06/2021. FINDINGS: Brain: Cerebral volume is within normal limits for age. No restricted diffusion to suggest acute infarction. Relatively large, round, mildly lobulated extra-axial mass at the left middle cranial fossa appears to be arising at or near the low bony left clinoid process and measures up to 29 mm diameter (series 10, image 13 and series 17 image 20. The lesion is  isointense to gray matter on T1 and T2 imaging, and mildly displaces or possibly encases the left ICA terminus and left MCA M1 segment (series 10, image 13 and series 9, image 16). However, only mild regional mass effect and no associated cerebral edema. The left cavernous sinus appears relatively spared on this noncontrast exam. However, the lesion does appear to track to the midline at the anterior sella or planum sphenoidale (series 17, image 21 and series 9, image 13. Pituitary appears only mildly affected. But no midline shift. No ventriculomegaly, or acute intracranial hemorrhage. Cervicomedullary junction is within normal limits. Largely normal for age gray and white matter signal throughout the brain. Minimal to mild scattered subcortical white matter T2 and FLAIR hyperintensity, nonspecific. No cortical encephalomalacia. No chronic cerebral blood products identified. Deep gray matter nuclei, brainstem and cerebellum are normal for age.  Vascular: Major intracranial vascular flow voids are preserved. Skull and upper cervical spine: Mild degenerative spinal stenosis suspected at C4-C5 related to disc bulging. Visualized bone marrow signal is within normal limits. Sinuses/Orbits: Left optic chiasm abutted by the extra-axial mass, and the left optic nerve might traverse the mass on series 17, image 21. But the left orbital apex appears spared. Intraorbital soft tissues appear normal. Paranasal Visualized paranasal sinuses and mastoids are clear. Other: Visible internal auditory structures appear normal. Negative visible scalp and face. IMPRESSION: 1. Relatively large, 2.9 cm lobulated extra-axial mass centered at the medial left middle cranial fossa. Isointensity to brain on both CT and MRI strongly suggests Meningioma. And there is no associated cerebral edema. Follow-up post-contrast brain MRI recommended to confirm and further characterize. But on this noncontrast exam it appears the lesion engulfs the left  ICA terminus and MCA, probably tracks to the anterior sella/planum sphenoidale, but relatively spares the cavernous sinus and left orbital apex. 2. No other acute intracranial abnormality. And otherwise largely normal for age noncontrast MRI appearance of the brain. Electronically Signed   By: Genevie Ann M.D.   On: 08/07/2021 05:47   DG Chest Port 1 View  Result Date: 08/06/2021 CLINICAL DATA:  Syncope, seizure EXAM: PORTABLE CHEST 1 VIEW COMPARISON:  07/23/2020 FINDINGS: The heart size and mediastinal contours are within normal limits. Both lungs are clear. The visualized skeletal structures are unremarkable. IMPRESSION: No active disease. Electronically Signed   By: Randa Ngo M.D.   On: 08/06/2021 18:20     Assessment/Plan: Estimated body mass index is 25.7 kg/m as calculated from the following:   Height as of this encounter: 5\' 9"  (1.753 m).   Weight as of this encounter: 27.63 kg.   75 year old female with a probable incidental finding of a left medial sphenoid wing meningioma.  May or may not have had a seizure, and if so not likely from this lesion.  Recommend MRI of brain with and without contrast to further evaluate the lesion  Recommend follow-up with [place] as an outpatient to discuss management options   Eustace Moore 08/07/2021 11:27 AM

## 2021-08-07 NOTE — ED Notes (Signed)
Pt CBG collected and resulted [id number]

## 2021-08-07 NOTE — ED Notes (Signed)
Pharmacy tech notified that pt family now has updated med list available for reconciliation

## 2021-08-07 NOTE — ED Notes (Signed)
Diet was ordered for pt

## 2021-08-08 ENCOUNTER — Encounter: Payer: Self-pay | Admitting: *Deleted

## 2021-08-08 ENCOUNTER — Other Ambulatory Visit: Payer: Self-pay | Admitting: Cardiology

## 2021-08-08 DIAGNOSIS — R55 Syncope and collapse: Secondary | ICD-10-CM

## 2021-08-08 LAB — BASIC METABOLIC PANEL WITH GFR
Anion gap: 6 (ref 5–15)
BUN: 18 mg/dL (ref 8–23)
CO2: 28 mmol/L (ref 22–32)
Calcium: 9.3 mg/dL (ref 8.9–10.3)
Chloride: 103 mmol/L (ref 98–111)
Creatinine, Ser: 0.75 mg/dL (ref 0.44–1.00)
GFR, Estimated: >60 mL/min
Glucose, Bld: 199 mg/dL — ABNORMAL HIGH (ref 70–99)
Potassium: 4.2 mmol/L (ref 3.5–5.1)
Sodium: 137 mmol/L (ref 135–145)

## 2021-08-08 LAB — GLUCOSE, CAPILLARY: Glucose-Capillary: 226 mg/dL — ABNORMAL HIGH (ref 70–99)

## 2021-08-08 LAB — MAGNESIUM: Magnesium: 2.1 mg/dL (ref 1.7–2.4)

## 2021-08-08 NOTE — Discharge Summary (Signed)
Physician Discharge Summary  Candice Pineda JQG:[id number] DOB: October 09, 1945 DOA: 08/06/2021  PCP: Haydee Salter, MD  Admit date: 08/06/2021 Discharge date: 08/08/2021  Admitted From: Home Disposition:    Recommendations for Outpatient Follow-up:  Follow up with PCP in 1-2 weeks Please obtain BMP/CBC in one week your next doctors visit.  Ambulatory cardiac monitor placed Outpatient follow-up with neurosurgery in 2 weeks, Dr. Sherley Bounds to discuss abnormal MRI findings. Advised to avoid any sudden change in position  Home Health: PT/OT Equipment/Devices: None Discharge Condition: Stable CODE STATUS: Full code Diet recommendation: Diabetic  Brief/Interim Summary: 75 y.o. female with medical history significant for Orthostatic hypotension, bronchiectasis, history of GIST, insulin-dependent type 2 diabetes, osteoporosis anxiety who presents with concerns of seizure-like activity.  Patient had extensive work-up regarding syncope including EEG, MRI brain, echocardiogram which was all unremarkable.  She was seen by physical therapy as well.  There was incidental finding of meningioma which was eval by neurosurgery who recommends outpatient follow-up to discuss further management options. Today patient is medically stable for discharge.  Daughter is present at bedside during my evaluation.     Assessment & Plan:   Principal Problem:   Seizure-like activity ([place]) Active Problems:   Essential hypertension   Type 2 diabetes mellitus (HCC)   Orthostatic hypotension   Bronchiectasis (HCC)     2.9cm lobulated extra-axial mass centered at the medial left middle cranial fossa, no associated cerebral edema -Patient was seen by neurosurgery who recommends outpatient follow-up in 1-2 weeks to discuss further management options.  Currently its not causing any compressive symptoms.  Incidental finding of left medial sphenoid wing meningioma.   Seizure-like activity /syncope Orthostasis, autonomic  dysfunction -I suspect this could have been secondary to autonomic dysfunction or intravascular volume depletion.  Advised to currently hold off on outpatient hydrochlorothiazide but eventually can be restarted.  At this time EKG/telemetry is overall unremarkable.  Echocardiogram is normal.  EEG is also negative.  MRI showed incidental finding of meningioma without any compressive symptoms, seen by neurosurgery as mentioned above.   Chronic orthostatic hypotension Seen by neurology, on pyridostigmine chronically   Insulin-dependent type 2 diabetes, uncontrolled with hyperglycemia A1c 8.3 in 06/21/2021 Continue insulin, adjust as needed   Hypertension -Resume Cozaar for now.  Eventually can restart HCTZ if necessary.  Otherwise discuss with outpatient PCP        Body mass index is 25.7 kg/m.         Discharge Diagnoses:  Principal Problem:   Seizure-like activity ([place]) Active Problems:   Essential hypertension   Type 2 diabetes mellitus ([place])   Orthostatic hypotension   Bronchiectasis ([place])      Consultations: Neurosurgery  Subjective: Feeling okay no complaints.  Daughter is present at bedside  Discharge Exam: Vitals:   08/08/21 0350 08/08/21 0852  BP: (!) 125/58 (!) 127/57  Pulse: 76 87  Resp: (!) 22 16  Temp: 98.4 F (36.9 C) 97.9 F (36.6 C)  SpO2: 99% 100%   Vitals:   08/07/21 1604 08/07/21 1800 08/08/21 0350 08/08/21 0852  BP: (!) 127/55 128/68 (!) 125/58 (!) 127/57  Pulse: 70  76 87  Resp: 18 18 (!) 22 16  Temp: 98 F (36.7 C) 98.7 F (37.1 C) 98.4 F (36.9 C) 97.9 F (36.6 C)  TempSrc:  Oral Oral   SpO2: 100% 100% 99% 100%  Weight:      Height:        General: Pt is alert, awake, not in acute distress  Cardiovascular: RRR, S1/S2 +, no rubs, no gallops Respiratory: CTA bilaterally, no wheezing, no rhonchi Abdominal: Soft, NT, ND, bowel sounds + Extremities: no edema, no cyanosis  Discharge Instructions  Discharge Instructions      Ambulatory referral to Physical Therapy   Complete by: As directed       Allergies as of 08/08/2021       Reactions   Celexa [citalopram Hydrobromide] Other (See Comments)   Drowsiness   Metformin Hcl Diarrhea   Montelukast    Other reaction(s): Other (See Comments) Dizziness Dizziness   Tetanus Toxoids    Arm extremely painful, arm turned red and swollen   Alendronate Nausea Only   And loose stools. Patient states that she can and wants to continue this medication at this time. And loose stools. Patient states that she can and wants to continue this medication at this time.        Medication List     TAKE these medications    Accu-Chek Aviva device Use daily to check blood sugar daily   Accu-Chek Aviva Soln USE AS DIRECTED   alendronate 70 MG tablet Commonly known as: FOSAMAX Take 1 tablet (70 mg total) by mouth every 7 (seven) days. Take with a full glass of water on an empty stomach.   aspirin EC 81 MG tablet Take 81 mg by mouth daily.   BD Pen Needle Nano 2nd Gen 32G X 4 MM Misc Generic drug: Insulin Pen Needle USE TO INJECT INSULIN TWICE DAILY   fluticasone 50 MCG/ACT nasal spray Commonly known as: FLONASE Place 2 sprays into both nostrils daily.   glipiZIDE 5 MG 24 hr tablet Commonly known as: GLUCOTROL XL Take 1 tablet (5 mg total) by mouth daily with breakfast.   hydrochlorothiazide 25 MG tablet Commonly known as: HYDRODIURIL Take 0.5 tablets (12.5 mg total) by mouth daily.   Lancet Device Misc For use with Onetouch Ultra Lancets to check blood sugar twice daily   losartan 25 MG tablet Commonly known as: COZAAR Take 1 tablet (25 mg total) by mouth daily.   mirabegron ER 50 MG Tb24 tablet Commonly known as: MYRBETRIQ Take 50 mg by mouth daily.   MULTI-VITAMIN DAILY PO Take 1 tablet by mouth daily.   NovoLIN 70/30 Kwikpen (70-30) 100 UNIT/ML KwikPen Generic drug: insulin isophane & regular human KwikPen Inject 14 units into the skin  before breakfast and 20 units into skin before supper   omeprazole 40 MG capsule Commonly known as: PRILOSEC Take 1 capsule (40 mg total) by mouth 2 (two) times daily.   OneTouch Ultra test strip Generic drug: glucose blood USE AS DIRECTED EVERY MORNING AND EVERY NIGHT AT BEDTIME   onetouch ultrasoft lancets FOR USE WITH ONETOUCH ULTRA LANCETS TO CHECK BLOOD SUGAR TWICE DAILY.   potassium chloride SA 20 MEQ tablet Commonly known as: KLOR-CON Take 1 tablet (20 mEq total) by mouth daily.   pyridostigmine 60 MG tablet Commonly known as: MESTINON Take 0.5 tablets (30 mg total) by mouth 3 (three) times daily.   simvastatin 40 MG tablet Commonly known as: ZOCOR Take 1.5 tablets (60 mg total) by mouth daily at 6 PM.   Vitamin D (Cholecalciferol) 10 MCG (400 UNIT) Caps Take 2 capsules by mouth daily.        Follow-up Information     Eustace Moore, MD. Schedule an appointment as soon as possible for a visit in 2 week(s).   Specialty: Neurosurgery Contact information: [address]  [place] [address] [phone]         Haydee Salter, MD Follow up.   Specialty: Family Medicine Contact information: [place] [address] [phone]         [facility] Follow up.   Specialty: Cardiology Why: please call cardiology office if you do not hear from them in three business days, a referral for outpatient heart monitoring was made during your hospital stay Contact information: [address] [place] [phone]        Britt Bottom, MD Follow up on 08/15/2021.   Specialty: Neurology Why: possible seizure Contact information: [place] [address] [phone]         [facility] outpatient therapy Follow up.   Why: The outpatient therapy will contact you for the first appointment Contact information: [address] [place], [place] [address]  [phone]        Haydee Salter, MD Follow up in 1 week(s).   Specialty: Family Medicine Contact information: [place] [address] [phone]                Allergies  Allergen Reactions   Celexa [Citalopram Hydrobromide] Other (See Comments)    Drowsiness   Metformin Hcl Diarrhea   Montelukast     Other reaction(s): Other (See Comments) Dizziness Dizziness    Tetanus Toxoids     Arm extremely painful, arm turned red and swollen   Alendronate Nausea Only    And loose stools. Patient states that she can and wants to continue this medication at this time. And loose stools. Patient states that she can and wants to continue this medication at this time.     You were cared for by a hospitalist during your hospital stay. If you have any questions about your discharge medications or the care you received while you were in the hospital after you are discharged, you can call the unit and asked to speak with the hospitalist on call if the hospitalist that took care of you is not available. Once you are discharged, your primary care physician will handle any further medical issues. Please note that no refills for any discharge medications will be authorized once you are discharged, as it is imperative that you return to your primary care physician (or establish a relationship with a primary care physician if you do not have one) for your aftercare needs so that they can reassess your need for medications and monitor your lab values.   Procedures/Studies: CT Head Wo Contrast  Result Date: 08/06/2021 CLINICAL DATA:  Syncopal episode, seizure like activity EXAM: CT HEAD WITHOUT CONTRAST TECHNIQUE: Contiguous axial images were obtained from the base of the skull through the vertex without intravenous contrast. COMPARISON:  06/24/2019 FINDINGS: Brain: No evidence of acute infarction, hemorrhage, hydrocephalus, extra-axial collection or  mass lesion/mass effect. Vascular: No hyperdense vessel or unexpected calcification. Skull: Normal. Negative for fracture or focal lesion. Sinuses/Orbits: No acute finding. Other: None. IMPRESSION: No acute intracranial pathology. Electronically Signed   By: Delanna Ahmadi M.D.   On: 08/06/2021 18:16   MR BRAIN WO CONTRAST  Result Date: 08/07/2021 CLINICAL DATA:  75 year old female with recurrent syncope. EXAM: MRI HEAD WITHOUT CONTRAST TECHNIQUE: Multiplanar, multiecho pulse sequences of the brain and surrounding structures were obtained without intravenous contrast. COMPARISON:  Head CT 08/06/2021. FINDINGS: Brain: Cerebral volume is within normal limits for age. No restricted  diffusion to suggest acute infarction. Relatively large, round, mildly lobulated extra-axial mass at the left middle cranial fossa appears to be arising at or near the low bony left clinoid process and measures up to 29 mm diameter (series 10, image 13 and series 17 image 20. The lesion is isointense to gray matter on T1 and T2 imaging, and mildly displaces or possibly encases the left ICA terminus and left MCA M1 segment (series 10, image 13 and series 9, image 16). However, only mild regional mass effect and no associated cerebral edema. The left cavernous sinus appears relatively spared on this noncontrast exam. However, the lesion does appear to track to the midline at the anterior sella or planum sphenoidale (series 17, image 21 and series 9, image 13. Pituitary appears only mildly affected. But no midline shift. No ventriculomegaly, or acute intracranial hemorrhage. Cervicomedullary junction is within normal limits. Largely normal for age gray and white matter signal throughout the brain. Minimal to mild scattered subcortical white matter T2 and FLAIR hyperintensity, nonspecific. No cortical encephalomalacia. No chronic cerebral blood products identified. Deep gray matter nuclei, brainstem and cerebellum are normal for age.  Vascular: Major intracranial vascular flow voids are preserved. Skull and upper cervical spine: Mild degenerative spinal stenosis suspected at C4-C5 related to disc bulging. Visualized bone marrow signal is within normal limits. Sinuses/Orbits: Left optic chiasm abutted by the extra-axial mass, and the left optic nerve might traverse the mass on series 17, image 21. But the left orbital apex appears spared. Intraorbital soft tissues appear normal. Paranasal Visualized paranasal sinuses and mastoids are clear. Other: Visible internal auditory structures appear normal. Negative visible scalp and face. IMPRESSION: 1. Relatively large, 2.9 cm lobulated extra-axial mass centered at the medial left middle cranial fossa. Isointensity to brain on both CT and MRI strongly suggests Meningioma. And there is no associated cerebral edema. Follow-up post-contrast brain MRI recommended to confirm and further characterize. But on this noncontrast exam it appears the lesion engulfs the left ICA terminus and MCA, probably tracks to the anterior sella/planum sphenoidale, but relatively spares the cavernous sinus and left orbital apex. 2. No other acute intracranial abnormality. And otherwise largely normal for age noncontrast MRI appearance of the brain. Electronically Signed   By: Genevie Ann M.D.   On: 08/07/2021 05:47   MR BRAIN W CONTRAST  Result Date: 08/07/2021 CLINICAL DATA:  Brain mass seen on prior noncontrast brain MRI EXAM: MRI HEAD WITH CONTRAST TECHNIQUE: Multiplanar, multiecho pulse sequences of the brain and surrounding structures were obtained with intravenous contrast. CONTRAST:  82mL GADAVIST GADOBUTROL 1 MMOL/ML IV SOLN COMPARISON:  Same-day noncontrast brain MRI FINDINGS: Brain: Homogeneous enhancement of the previously seen lesion centered at the left sphenoid wing, consistent with a meningioma. The lesion measuresr up to 2.6 cm AP by 2.4 cm TV by 2.1 cm cc. The lesion completely encases a portion of the left ICA  terminus and M1 segment (8-14). The lesion tracks inferolaterally along the middle cranial fossa. There is also medial extent along the planum sphenoidale and into the anterior aspect of the sella (8-11 (7-20). The lesion also protrudes into the suprasellar cistern, but there is no evidence of mass effect on the pituitary infundibulum or optic chiasm. There is no evidence of extension into the orbital apex or through skull base foramina. There is no definite invasion of the cavernous sinus. There is an additional 9 mm meningioma overlying the right frontal lobe (6-28). Parenchymal volume is stable. The ventricles are stable in size.  There is minimal mass effect on the brain parenchyma by the above described lesions with no midline shift. Vascular: The vessels enhance normally. As above, there is partial vascular encasement by the above-described left sphenoid wing meningioma. Skull and upper cervical spine: Normal marrow signal. Sinuses/Orbits: No acute findings Other: None. IMPRESSION: 1. Homogeneous enhancement of the previously seen lesion along the left sphenoid wing consistent with a meningioma. The lesion encases the left ICA terminus and a portion of the left M1 segment and extends along the planum sphenoidale and anterior aspect of the sella, as above. There is no definite cavernous sinus invasion. 2. Additional 9 mm meningioma overlying the right frontal lobe. Electronically Signed   By: Valetta Mole M.D.   On: 08/07/2021 17:37   DG Chest Port 1 View  Result Date: 08/06/2021 CLINICAL DATA:  Syncope, seizure EXAM: PORTABLE CHEST 1 VIEW COMPARISON:  07/23/2020 FINDINGS: The heart size and mediastinal contours are within normal limits. Both lungs are clear. The visualized skeletal structures are unremarkable. IMPRESSION: No active disease. Electronically Signed   By: Randa Ngo M.D.   On: 08/06/2021 18:20   EEG adult  Result Date: 08/07/2021 Lora Havens, MD     08/07/2021 12:04 PM Patient  Name: Analea Muller MRN: [id number] Epilepsy Attending: Lora Havens Referring Physician/Provider: Dr Ileene Musa Date: 08/07/2021 Duration: 22.55 mins Patient history: 75yo F with recurrent syncope associated with head flushing and urinary incontinence. EEG to evaluate for seizure Level of alertness: Awake AEDs during EEG study: None Technical aspects: This EEG study was done with scalp electrodes positioned according to the 10-20 International system of electrode placement. Electrical activity was acquired at a sampling rate of 500Hz  and reviewed with a high frequency filter of 70Hz  and a low frequency filter of 1Hz . EEG data were recorded continuously and digitally stored. Description: The posterior dominant rhythm consists of 9-10 Hz activity of moderate voltage (25-35 uV) seen predominantly in posterior head regions, symmetric and reactive to eye opening and eye closing. Hyperventilation and photic stimulation were not performed.   IMPRESSION: This study is within normal limits. No seizures or epileptiform discharges were seen throughout the recording. Lora Havens   ECHOCARDIOGRAM COMPLETE  Result Date: 08/07/2021    ECHOCARDIOGRAM REPORT   Patient Name:   KAYLEA MOUNSEY Date of Exam: 08/07/2021 Medical Rec #:  [id number]  Height:       69.0 in Accession #:    [id number] Weight:       174.0 lb Date of Birth:  06-11-1946   BSA:          1.947 m Patient Age:    10 years   BP:           129/53 mmHg Patient Gender: F          HR:           75 bpm. Exam Location:  Inpatient Procedure: 2D Echo, Color Doppler and Cardiac Doppler Indications:    R55 Syncope  History:        Patient has prior history of Echocardiogram examinations, most                 recent 04/27/2020. Risk Factors:Hypertension and Diabetes.  Sonographer:    Raquel Sarna Senior RDCS Referring Phys: [id number] [place] T TU  Sonographer Comments: Very lateral apical window, images would be improved in echo bed with dropout. IMPRESSIONS  1. Left ventricular ejection  fraction, by estimation, is 60 to 65%. The left ventricle has  normal function. The left ventricle has no regional wall motion abnormalities. Left ventricular diastolic parameters were normal.  2. Right ventricular systolic function is normal. The right ventricular size is normal. There is normal pulmonary artery systolic pressure.  3. The mitral valve is normal in structure. Trivial mitral valve regurgitation. No evidence of mitral stenosis.  4. The aortic valve is tricuspid. Aortic valve regurgitation is not visualized. No aortic stenosis is present.  5. The inferior vena cava is normal in size with greater than 50% respiratory variability, suggesting right atrial pressure of 3 mmHg. FINDINGS  Left Ventricle: Left ventricular ejection fraction, by estimation, is 60 to 65%. The left ventricle has normal function. The left ventricle has no regional wall motion abnormalities. The left ventricular internal cavity size was normal in size. There is  no left ventricular hypertrophy. Left ventricular diastolic parameters were normal. Right Ventricle: The right ventricular size is normal. Right ventricular systolic function is normal. There is normal pulmonary artery systolic pressure. The tricuspid regurgitant velocity is 2.13 m/s, and with an assumed right atrial pressure of 3 mmHg,  the estimated right ventricular systolic pressure is 35.4 mmHg. Left Atrium: Left atrial size was normal in size. Right Atrium: Right atrial size was normal in size. Pericardium: There is no evidence of pericardial effusion. Mitral Valve: The mitral valve is normal in structure. Trivial mitral valve regurgitation. No evidence of mitral valve stenosis. Tricuspid Valve: The tricuspid valve is normal in structure. Tricuspid valve regurgitation is mild . No evidence of tricuspid stenosis. Aortic Valve: The aortic valve is tricuspid. Aortic valve regurgitation is not visualized. No aortic stenosis is present. Pulmonic Valve: The pulmonic valve was  normal in structure. Pulmonic valve regurgitation is not visualized. No evidence of pulmonic stenosis. Aorta: The aortic root is normal in size and structure. Venous: The inferior vena cava is normal in size with greater than 50% respiratory variability, suggesting right atrial pressure of 3 mmHg. IAS/Shunts: There is redundancy of the interatrial septum. No atrial level shunt detected by color flow Doppler.  LEFT VENTRICLE PLAX 2D LVIDd:         3.60 cm   Diastology LVIDs:         1.90 cm   LV e' medial:    7.62 cm/s LV PW:         0.90 cm   LV E/e' medial:  10.5 LV IVS:        0.80 cm   LV e' lateral:   9.36 cm/s LVOT diam:     1.90 cm   LV E/e' lateral: 8.6 LV SV:         78 LV SV Index:   40 LVOT Area:     2.84 cm  RIGHT VENTRICLE RV S prime:     13.40 cm/s TAPSE (M-mode): 2.4 cm LEFT ATRIUM             Index        RIGHT ATRIUM           Index LA diam:        2.90 cm 1.49 cm/m   RA Area:     17.90 cm LA Vol (A2C):   61.5 ml 31.58 ml/m  RA Volume:   48.30 ml  24.80 ml/m LA Vol (A4C):   53.4 ml 27.42 ml/m LA Biplane Vol: 60.9 ml 31.27 ml/m  AORTIC VALVE LVOT Vmax:   106.00 cm/s LVOT Vmean:  79.100 cm/s LVOT VTI:    0.275 m  AORTA  Ao Root diam: 3.00 cm Ao Asc diam:  3.20 cm MITRAL VALVE               TRICUSPID VALVE MV Area (PHT): 3.40 cm    TR Peak grad:   18.1 mmHg MV Decel Time: 223 msec    TR Vmax:        213.00 cm/s MV E velocity: 80.20 cm/s MV A velocity: 78.60 cm/s  SHUNTS MV E/A ratio:  1.02        Systemic VTI:  0.28 m                            Systemic Diam: 1.90 cm Kirk Ruths MD Electronically signed by Kirk Ruths MD Signature Date/Time: 08/07/2021/1:12:02 PM    Final      The results of significant diagnostics from this hospitalization (including imaging, microbiology, ancillary and laboratory) are listed below for reference.     Microbiology: Recent Results (from the past 240 hour(s))  Resp Panel by RT-PCR (Flu A&B, Covid) Nasopharyngeal Swab     Status: None   Collection  Time: 08/06/21  3:25 PM   Specimen: Nasopharyngeal Swab; Nasopharyngeal(NP) swabs in vial transport medium  Result Value Ref Range Status   SARS Coronavirus 2 by RT PCR NEGATIVE NEGATIVE Final    Comment: (NOTE) SARS-CoV-2 target nucleic acids are NOT DETECTED.  The SARS-CoV-2 RNA is generally detectable in upper respiratory specimens during the acute phase of infection. The lowest concentration of SARS-CoV-2 viral copies this assay can detect is 138 copies/mL. A negative result does not preclude SARS-Cov-2 infection and should not be used as the sole basis for treatment or other patient management decisions. A negative result may occur with  improper specimen collection/handling, submission of specimen other than nasopharyngeal swab, presence of viral mutation(s) within the areas targeted by this assay, and inadequate number of viral copies(<138 copies/mL). A negative result must be combined with clinical observations, patient history, and epidemiological information. The expected result is Negative.  Fact Sheet for Patients:  EntrepreneurPulse.com.au  Fact Sheet for Healthcare Providers:  IncredibleEmployment.be  This test is no t yet approved or cleared by the [place] FDA and  has been authorized for detection and/or diagnosis of SARS-CoV-2 by FDA under an Emergency Use Authorization (EUA). This EUA will remain  in effect (meaning this test can be used) for the duration of the COVID-19 declaration under Section 564(b)(1) of the Act, 21 U.S.C.section 360bbb-3(b)(1), unless the authorization is terminated  or revoked sooner.       Influenza A by PCR NEGATIVE NEGATIVE Final   Influenza B by PCR NEGATIVE NEGATIVE Final    Comment: (NOTE) The Xpert Xpress SARS-CoV-2/FLU/RSV plus assay is intended as an aid in the diagnosis of influenza from Nasopharyngeal swab specimens and should not be used as a sole basis for treatment. Nasal washings  and aspirates are unacceptable for Xpert Xpress SARS-CoV-2/FLU/RSV testing.  Fact Sheet for Patients: EntrepreneurPulse.com.au  Fact Sheet for Healthcare Providers: IncredibleEmployment.be  This test is not yet approved or cleared by the [place] FDA and has been authorized for detection and/or diagnosis of SARS-CoV-2 by FDA under an Emergency Use Authorization (EUA). This EUA will remain in effect (meaning this test can be used) for the duration of the COVID-19 declaration under Section 564(b)(1) of the Act, 21 U.S.C. section 360bbb-3(b)(1), unless the authorization is terminated or revoked.  Performed at [facility], [address] [place],  [place] [address]      Labs: BNP (last 3 results) No results for input(s): BNP in the last 8760 hours. Basic Metabolic Panel: Recent Labs  Lab 08/06/21 1700 08/08/21 0258  NA 137 137  K 4.3 4.2  CL 107 103  CO2 25 28  GLUCOSE 282* 199*  BUN 21 18  CREATININE 1.01* 0.75  CALCIUM 8.9 9.3  MG  --  2.1   Liver Function Tests: Recent Labs  Lab 08/06/21 1700  AST 28  ALT 19  ALKPHOS 55  BILITOT 0.3  PROT 7.0  ALBUMIN 3.2*   No results for input(s): LIPASE, AMYLASE in the last 168 hours. No results for input(s): AMMONIA in the last 168 hours. CBC: Recent Labs  Lab 08/06/21 1700  WBC 8.7  HGB 11.6*  HCT 37.4  MCV 70.3*  PLT 277   Cardiac Enzymes: No results for input(s): CKTOTAL, CKMB, CKMBINDEX, TROPONINI in the last 168 hours. BNP: Invalid input(s): POCBNP CBG: Recent Labs  Lab 08/07/21 0802 08/07/21 1253 08/07/21 1611 08/07/21 2109 08/08/21 0617  GLUCAP 138* 201* 180* 206* 226*   D-Dimer No results for input(s): DDIMER in the last 72 hours. Hgb A1c No results for input(s): HGBA1C in the last 72 hours. Lipid Profile No results for input(s): CHOL, HDL, LDLCALC, TRIG, CHOLHDL, LDLDIRECT in the last 72 hours. Thyroid function studies Recent Labs     08/07/21 0312  TSH 1.081   Anemia work up No results for input(s): VITAMINB12, FOLATE, FERRITIN, TIBC, IRON, RETICCTPCT in the last 72 hours. Urinalysis    Component Value Date/Time   COLORURINE YELLOW 06/21/2021 0802   APPEARANCEUR CLEAR 06/21/2021 0802   LABSPEC 1.010 06/21/2021 0802   PHURINE 6.0 06/21/2021 0802   GLUCOSEU NEGATIVE 06/21/2021 0802   HGBUR NEGATIVE 06/21/2021 0802   BILIRUBINUR NEGATIVE 06/21/2021 0802   KETONESUR NEGATIVE 06/21/2021 0802   PROTEINUR NEGATIVE 08/17/2020 1051   UROBILINOGEN 0.2 06/21/2021 0802   NITRITE NEGATIVE 06/21/2021 0802   LEUKOCYTESUR TRACE (A) 06/21/2021 0802   Sepsis Labs Invalid input(s): PROCALCITONIN,  WBC,  LACTICIDVEN Microbiology Recent Results (from the past 240 hour(s))  Resp Panel by RT-PCR (Flu A&B, Covid) Nasopharyngeal Swab     Status: None   Collection Time: 08/06/21  3:25 PM   Specimen: Nasopharyngeal Swab; Nasopharyngeal(NP) swabs in vial transport medium  Result Value Ref Range Status   SARS Coronavirus 2 by RT PCR NEGATIVE NEGATIVE Final    Comment: (NOTE) SARS-CoV-2 target nucleic acids are NOT DETECTED.  The SARS-CoV-2 RNA is generally detectable in upper respiratory specimens during the acute phase of infection. The lowest concentration of SARS-CoV-2 viral copies this assay can detect is 138 copies/mL. A negative result does not preclude SARS-Cov-2 infection and should not be used as the sole basis for treatment or other patient management decisions. A negative result may occur with  improper specimen collection/handling, submission of specimen other than nasopharyngeal swab, presence of viral mutation(s) within the areas targeted by this assay, and inadequate number of viral copies(<138 copies/mL). A negative result must be combined with clinical observations, patient history, and epidemiological information. The expected result is Negative.  Fact Sheet for Patients:   EntrepreneurPulse.com.au  Fact Sheet for Healthcare Providers:  IncredibleEmployment.be  This test is no t yet approved or cleared by the [place] FDA and  has been authorized for detection and/or diagnosis of SARS-CoV-2 by FDA under an Emergency Use Authorization (EUA). This EUA will remain  in effect (meaning this test can be used) for  the duration of the COVID-19 declaration under Section 564(b)(1) of the Act, 21 U.S.C.section 360bbb-3(b)(1), unless the authorization is terminated  or revoked sooner.       Influenza A by PCR NEGATIVE NEGATIVE Final   Influenza B by PCR NEGATIVE NEGATIVE Final    Comment: (NOTE) The Xpert Xpress SARS-CoV-2/FLU/RSV plus assay is intended as an aid in the diagnosis of influenza from Nasopharyngeal swab specimens and should not be used as a sole basis for treatment. Nasal washings and aspirates are unacceptable for Xpert Xpress SARS-CoV-2/FLU/RSV testing.  Fact Sheet for Patients: EntrepreneurPulse.com.au  Fact Sheet for Healthcare Providers: IncredibleEmployment.be  This test is not yet approved or cleared by the [place] FDA and has been authorized for detection and/or diagnosis of SARS-CoV-2 by FDA under an Emergency Use Authorization (EUA). This EUA will remain in effect (meaning this test can be used) for the duration of the COVID-19 declaration under Section 564(b)(1) of the Act, 21 U.S.C. section 360bbb-3(b)(1), unless the authorization is terminated or revoked.  Performed at [facility], [place] [address] [place], [place] [address]      Time coordinating discharge:  I have spent 35 minutes face to face with the patient and on the ward discussing the patients care, assessment, plan and disposition with other care givers. >50% of the time was devoted counseling the patient about the risks and benefits of treatment/Discharge disposition and  coordinating care.   SIGNED:   Damita Lack, MD  [facility] 08/08/2021, 1:52 PM   If 7PM-7AM, please contact night-coverage

## 2021-08-08 NOTE — Plan of Care (Signed)

## 2021-08-08 NOTE — Evaluation (Signed)
Physical Therapy Evaluation and Discharge Patient Details Name: Candice Pineda MRN: [id number] DOB: [birth year] Today's Date: 08/08/2021  History of Present Illness  75 yo F with recurrent syncope associated with head flushing and urinary incontinence, EEG wihtin normal limites, MRI (+)  medial sphenoid wing mass consistent with meningioma. Pt with medical history significant for Orthostatic hypotension, bronchiectasis, history of GIST, insulin-dependent type 2 diabetes, osteoporosis anxiety.   Clinical Impression  Patient evaluated by Physical Therapy with no further acute PT needs identified. All education has been completed and the patient has no further questions. At the time of PT eval pt was able to perform transfers and ambulation with gross modified independence and no AD. Pt scored 20/24 on the DGI. Pt reports she feels almost back to normal however appears guarded at times. Feel pt could benefit from outpatient follow up to maximize return to PLOF and for guidance for safe exercise program when she is able to transition back to [employer].  See below for any follow-up Physical Therapy or equipment needs. PT is signing off. Thank you for this referral.      Recommendations for follow up therapy are one component of a multi-disciplinary discharge planning process, led by the attending physician.  Recommendations may be updated based on patient status, additional functional criteria and insurance authorization.  Follow Up Recommendations Outpatient PT    Assistance Recommended at Discharge PRN  Functional Status Assessment Patient has had a recent decline in their functional status and demonstrates the ability to make significant improvements in function in a reasonable and predictable amount of time.  Equipment Recommendations  None recommended by PT    Recommendations for Other Services       Precautions / Restrictions Precautions Precautions: Fall Restrictions Weight Bearing  Restrictions: No      Mobility  Bed Mobility Overal bed mobility: Independent             General bed mobility comments: Pt received sitting up in the chair.    Transfers Overall transfer level: Independent Equipment used: None               General transfer comment: Pt was able to power up to full stand without assistance. No unsteadiness or LOB noted. Pt immediately initiated ambulation upon reaching stand.    Ambulation/Gait Ambulation/Gait assistance: Modified independent (Device/Increase time) Gait Distance (Feet): 400 Feet Assistive device: None Gait Pattern/deviations: Step-through pattern;Decreased stride length Gait velocity: Decreased Gait velocity interpretation: <1.31 ft/sec, indicative of household ambulator   General Gait Details: Grossly pt without unsteadiness or LOB however appeared guarded.  Stairs Stairs: Yes Stairs assistance: Modified independent (Device/Increase time) Stair Management: One rail Right;Alternating pattern;Forwards Number of Stairs: 5 General stair comments: Practice steps iin the rehab gym. No assist  Wheelchair Mobility    Modified Rankin (Stroke Patients Only)       Balance Overall balance assessment: Modified Independent;No apparent balance deficits (not formally assessed)                               Standardized Balance Assessment Standardized Balance Assessment : Dynamic Gait Index   Dynamic Gait Index Level Surface: Normal Change in Gait Speed: Normal Gait with Horizontal Head Turns: Normal Gait with Vertical Head Turns: Normal Gait and Pivot Turn: Mild Impairment Step Over Obstacle: Mild Impairment Step Around Obstacles: Mild Impairment Steps: Mild Impairment Total Score: 20       Pertinent Vitals/Pain Pain Assessment: No/denies  pain    Home Living Family/patient expects to be discharged to:: Private residence Living Arrangements: Alone Available Help at Discharge:  Family;Available PRN/intermittently Type of Home: House Home Access: Stairs to enter Entrance Stairs-Rails: Right Entrance Stairs-Number of Steps: 4   Home Layout: One level Home Equipment: Cane - single point;Toilet riser      Prior Function Prior Level of Function : Independent/Modified Independent             Mobility Comments: Going to [employer] to exercise. Does not do group classes, uses machines but did not seem very confident.       Hand Dominance   Dominant Hand: Right    Extremity/Trunk Assessment   Upper Extremity Assessment Upper Extremity Assessment: Overall WFL for tasks assessed    Lower Extremity Assessment Lower Extremity Assessment: Overall WFL for tasks assessed    Cervical / Trunk Assessment Cervical / Trunk Assessment: Normal  Communication   Communication: No difficulties  Cognition Arousal/Alertness: Awake/alert Behavior During Therapy: WFL for tasks assessed/performed Overall Cognitive Status: Within Functional Limits for tasks assessed                                          General Comments General comments (skin integrity, edema, etc.): VSS on RA, pt's daughter present and supportive    Exercises     Assessment/Plan    PT Assessment All further PT needs can be met in the next venue of care  PT Problem List         PT Treatment Interventions      PT Goals (Current goals can be found in the Care Plan section)  Acute Rehab PT Goals Patient Stated Goal: back to [employer] PT Goal Formulation: All assessment and education complete, DC therapy    Frequency     Barriers to discharge        Co-evaluation               AM-PAC PT "6 Clicks" Mobility  Outcome Measure Help needed turning from your back to your side while in a flat bed without using bedrails?: None Help needed moving from lying on your back to sitting on the side of a flat bed without using bedrails?: None Help needed moving to  and from a bed to a chair (including a wheelchair)?: None Help needed standing up from a chair using your arms (e.g., wheelchair or bedside chair)?: None Help needed to walk in hospital room?: None Help needed climbing 3-5 steps with a railing? : None 6 Click Score: 24    End of Session Equipment Utilized During Treatment: Gait belt Activity Tolerance: Patient tolerated treatment well Patient left: in chair;with family/visitor present Nurse Communication: Mobility status PT Visit Diagnosis: Other symptoms and signs involving the nervous system (R29.898)    Time: 1036-1050 PT Time Calculation (min) (ACUTE ONLY): 14 min   Charges:   PT Evaluation $PT Eval Low Complexity: 1 Low          Rolinda Roan, PT, DPT Acute Rehabilitation Services Pager: [phone] Office: [phone]   Thelma Comp 08/08/2021, 12:50 PM

## 2021-08-08 NOTE — TOC Transition Note (Signed)
Transition of Care [facility]) - CM/SW Discharge Note   Patient Details  Name: Candice Pineda MRN: [id number] Date of Birth: 06-15-1946  Transition of [facility] CM/SW Contact:  Pollie Friar, RN Phone Number: 08/08/2021, 11:06 AM   Clinical Narrative:    Patient is discharging to Outpatient therapy at [facility]. CM will fax the orders to [facility] outpatient therpay: [phone].  Pt denies issues with home transportation or medications.  Pt has transport home today.   Final next level of care: OP Rehab Barriers to Discharge: No Barriers Identified   Patient Goals and CMS Choice     Choice offered to / list presented to : Patient  Discharge Placement                       Discharge Plan and Services                                     Social Determinants of Health (SDOH) Interventions     Readmission Risk Interventions No flowsheet data found.

## 2021-08-08 NOTE — Progress Notes (Signed)
Order to discharge pt home.  Discharge instructions/AVS given to patient and reviewed - education provided as needed.  Pt advised to call PCP and/or come back to the hospital if there are any problems. Pt verbalized understanding.    

## 2021-08-08 NOTE — Progress Notes (Signed)
[facility] asked to place cardiac monitor in the setting of syncope. Dr. Harrington Challenger to read. Follow up appt arranged.

## 2021-08-08 NOTE — Progress Notes (Unsigned)
Patient ID: Candice Pineda, female   DOB: 1945/12/13, 75 y.o.   MRN: [id number] Enrolled for [employer] to ship a 30 day cardiac event monitor to the patients address on file.  Letter with instructions mailed to patient.

## 2021-08-08 NOTE — Evaluation (Signed)
Occupational Therapy Evaluation Patient Details Name: Candice Pineda MRN: [id number] DOB: 11-29-1945 Today's Date: 08/08/2021   History of Present Illness 75yo F with recurrent syncope associated with head flushing and urinary incontinence, EEG wihtin normal limites, MRI (+)  medial sphenoid wing mass consistent with meningioma. Pt with medical history significant for Orthostatic hypotension, bronchiectasis, history of GIST, insulin-dependent type 2 diabetes, osteoporosis anxiety.   Clinical Impression   Dody was indep PTA with reports of a few fainting episodes but no falls. She lives alone in a 1 level home, 4 STE with family near-by who can assist as needed. Pt demonstrated great ability to complete ADLs at her indep baseline, as well as transfer and ambulate without AD independently. Provided pt with some fall education and safety techniques for syncopal episodes, she verbalized great understanding. Pt does not have an acute OT needs. Recommend d/c home without follow up OT.      Recommendations for follow up therapy are one component of a multi-disciplinary discharge planning process, led by the attending physician.  Recommendations may be updated based on patient status, additional functional criteria and insurance authorization.   Follow Up Recommendations  No OT follow up    Assistance Recommended at Discharge Intermittent Supervision/Assistance  Functional Status Assessment  Patient has had a recent decline in their functional status and demonstrates the ability to make significant improvements in function in a reasonable and predictable amount of time.  Equipment Recommendations  None recommended by OT       Precautions / Restrictions Precautions Precautions: Fall Restrictions Weight Bearing Restrictions: No      Mobility Bed Mobility Overal bed mobility: Independent                  Transfers Overall transfer level: Independent Equipment used: None                       Balance Overall balance assessment: Modified Independent;No apparent balance deficits (not formally assessed)                                         ADL either performed or assessed with clinical judgement   ADL Overall ADL's : Independent                                       General ADL Comments: pt completed all ADLs at her indep baseline, supervision provided throughout session for safety however pt demonstrated good balance, safety and independence with ADLs.     Vision Baseline Vision/History: 0 No visual deficits Ability to See in Adequate Light: 0 Adequate Patient Visual Report: No change from baseline Vision Assessment?: No apparent visual deficits            Pertinent Vitals/Pain Pain Assessment: No/denies pain     Hand Dominance Right   Extremity/Trunk Assessment Upper Extremity Assessment Upper Extremity Assessment: Overall WFL for tasks assessed   Lower Extremity Assessment Lower Extremity Assessment: Overall WFL for tasks assessed   Cervical / Trunk Assessment Cervical / Trunk Assessment: Normal   Communication Communication Communication: No difficulties   Cognition Arousal/Alertness: Awake/alert Behavior During Therapy: WFL for tasks assessed/performed Overall Cognitive Status: Within Functional Limits for tasks assessed  General Comments  VSS on RA, pt's daughter present and supportive            Home Living Family/patient expects to be discharged to:: Private residence Living Arrangements: Alone Available Help at Discharge: Family;Available PRN/intermittently Type of Home: House Home Access: Stairs to enter [employer] of Steps: 4 Entrance Stairs-Rails: Right Home Layout: One level     Bathroom Shower/Tub: Tub/shower unit;Curtain   Bathroom Toilet: Standard (with riser)     Home Equipment: Cane - single point;Toilet  riser          Prior Functioning/Environment Prior Level of Function : Independent/Modified Independent                        OT Problem List: Decreased activity tolerance;Decreased safety awareness         OT Goals(Current goals can be found in the care plan section) Acute Rehab OT Goals Patient Stated Goal: home soon OT Goal Formulation: All assessment and education complete, DC therapy   AM-PAC OT "6 Clicks" Daily Activity     Outcome Measure Help from another person eating meals?: None Help from another person taking care of personal grooming?: None Help from another person toileting, which includes using toliet, bedpan, or urinal?: None Help from another person bathing (including washing, rinsing, drying)?: None Help from another person to put on and taking off regular upper body clothing?: None Help from another person to put on and taking off regular lower body clothing?: None 6 Click Score: 24   End of Session Nurse Communication: Mobility status  Activity Tolerance: Patient tolerated treatment well Patient left: in bed;with call bell/phone within reach;with family/visitor present  OT Visit Diagnosis: Other abnormalities of gait and mobility (R26.89)                Time: 8675-4492 OT Time Calculation (min): 15 min Charges:  OT General Charges $OT Visit: 1 Visit OT Evaluation $OT Eval Low Complexity: 1 Low   Darlyn Repsher A Rykker Coviello 08/08/2021, 10:35 AM

## 2021-08-09 ENCOUNTER — Telehealth: Payer: Self-pay

## 2021-08-09 ENCOUNTER — Telehealth: Payer: Self-pay | Admitting: Family Medicine

## 2021-08-09 NOTE — Telephone Encounter (Signed)
Transition Care Management Follow-up Telephone Call Date of discharge and from where: Candice Pineda 08/08/2021 How have you been since you were released from the hospital? better Any questions or concerns? No  Items Reviewed: Did the pt receive and understand the discharge instructions provided? Yes  Medications obtained and verified? Yes  Other?  N/A Any new allergies since your discharge? No  Dietary orders reviewed? Yes Do you have support at home? Yes   Home Care and Equipment/Supplies: Were home health services ordered? no If so, what is the name of the agency? N/a  Has the agency set up a time to come to the patient's home? not applicable Were any new equipment or medical supplies ordered?  No What is the name of the medical supply agency? N/a Were you able to get the supplies/equipment? not applicable Do you have any questions related to the use of the equipment or supplies? No  Functional Questionnaire: (I = Independent and D = Dependent) ADLs: I  Bathing/Dressing- i  Meal Prep- i  Eating- i  Maintaining continence- i  Transferring/Ambulation- i  Managing Meds- i  Follow up appointments reviewed:  PCP Hospital f/u appt confirmed? Yes  Scheduled to see Candice Pineda on 08/16/2021 @ 1130. [facility] f/u appt confirmed? Yes  Scheduled to see Candice Pineda on 08/15/21 @ 1100. Are transportation arrangements needed? No  If their condition worsens, is the pt aware to call PCP or go to the Emergency Dept.? Yes Was the patient provided with contact information for the PCP's office or ED? Yes Was to pt encouraged to call back with questions or concerns? Yes

## 2021-08-09 NOTE — Telephone Encounter (Signed)
Pt was admitted to [facility] on 08/06/21 for Seizure-like activity [facility]) Principal problem and released on 08/08/21. The doctor in the hospital took he off of her hydrochlorothiazide (HYDRODIURIL) 25 MG tablet [[id number]]. I scheduled her a hosp f/up on 08/16/21. She wanted to get in contact with [place], because she has  been taken off of the med listed above. If any further questions please advise pt at [phone].

## 2021-08-15 ENCOUNTER — Telehealth: Payer: Self-pay | Admitting: Neurology

## 2021-08-15 ENCOUNTER — Ambulatory Visit: Payer: HMO | Admitting: Neurology

## 2021-08-15 ENCOUNTER — Encounter: Payer: Self-pay | Admitting: Neurology

## 2021-08-15 VITALS — BP 145/63 | HR 73 | Ht 69.5 in | Wt 182.0 lb

## 2021-08-15 DIAGNOSIS — D329 Benign neoplasm of meninges, unspecified: Secondary | ICD-10-CM | POA: Diagnosis not present

## 2021-08-15 DIAGNOSIS — R404 Transient alteration of awareness: Secondary | ICD-10-CM | POA: Diagnosis not present

## 2021-08-15 DIAGNOSIS — M542 Cervicalgia: Secondary | ICD-10-CM | POA: Diagnosis not present

## 2021-08-15 DIAGNOSIS — M5412 Radiculopathy, cervical region: Secondary | ICD-10-CM

## 2021-08-15 NOTE — Telephone Encounter (Signed)
Health team order sent to [facility], [facility] they will reach out to the patient to schedule.

## 2021-08-15 NOTE — Progress Notes (Addendum)
[facility]  PATIENT: Candice Pineda DOB: Oct 11, 1945  REFERRING DOCTOR OR PCP: Arlester Marker, MD SOURCE: Patient, notes from primary care  _________________________________   HISTORICAL  CHIEF COMPLAINT:  Chief Complaint  Patient presents with   New Patient (Initial Visit)    New room, alone. Internal referral for right arm numbness. Sx constant. When she turns head to right, gets pain radiates down arm into fingers. Denies any injuries. Sx started 3-4 months ago.     HISTORY OF PRESENT ILLNESS:  I had the pleasure of seeing your patient, Candice Pineda, at [facility] for neurologic consultation regarding her arm numbness/pain, recent episode of altered awareness and abnormal brain MRI.  She is a 75 yo woman who has had aching and numbness down the right arm x 3-4 months.   She has more pain in her shoulder if she looks to the right.    She has not had any imaging.    She denies weakness in the arms or legs.  She denies numbness in the legs.  No change in her bladder.  She had a syncopal episode 08/06/2021 and went to the ED.   She was eating (sitting) and then felt her head get hot and she felt lightheaded.  She laid her head down and friends helped her walk to the couch and she had incontinence.  She does not think she actually lost consciousness but had some altered awareness and felt she was about to pass out.    She was better 5 minutes later.   Her daughter told her that the right arm shook some but no GTC.    She was taken to the ED.   She had  a CT and MRI.  No evidence of acute change or CVA.  However,  a recent MRI of the brain showed a meningioma from the left sphenoid wing encasing the left ICA/left M1 extending towards the sella turcica.   She is set to see neurosurgery 08/25/2021.   She does not experience diplopia.  She has had sone intermittent numbness in the face (either side) but no continuous numbness.   She gets occasional HA over the left  eye.      She also has mild carotid stenosis (21-39%).   Vascular risks are HTN, hyperlipidemia, Type 2 IDDM  MRI of the brain 08/07/2021 shows a large left sphenoid wing meningioma.  It extends to the sella turcica.  The tumor appears to wrap around the M1 segment of the left middle cerebral artery and the left internal carotid artery.  There does not appear to be direct invasion of the cavernous sinus.  Incidental note of another small meningioma over the right frontal convexity  REVIEW OF SYSTEMS: Constitutional: No fevers, chills, sweats, or change in appetite Eyes: No visual changes, double vision, eye pain Ear, nose and throat: No hearing loss, ear pain, nasal congestion, sore throat Cardiovascular: No chest pain, palpitations Respiratory:  No shortness of breath at rest or with exertion.   No wheezes GastrointestinaI: No nausea, vomiting, diarrhea, abdominal pain, fecal incontinence Genitourinary:  No dysuria, urinary retention or frequency.  No nocturia. Musculoskeletal:  No neck pain, back pain Integumentary: No rash, pruritus, skin lesions Neurological: as above Psychiatric: No depression at this time.  No anxiety Endocrine: She has insulin-dependent type 2 Diabetes mellitus  hematologic/Lymphatic:  No anemia, purpura, petechiae. Allergic/Immunologic: No itchy/runny eyes, nasal congestion, recent allergic reactions, rashes  ALLERGIES: Allergies  Allergen Reactions   Celexa [Citalopram Hydrobromide]  Other (See Comments)    Drowsiness   Metformin Hcl Diarrhea   Montelukast     Other reaction(s): Other (See Comments) Dizziness Dizziness    Tetanus Toxoids     Arm extremely painful, arm turned red and swollen   Alendronate Nausea Only    And loose stools. Patient states that she can and wants to continue this medication at this time. And loose stools. Patient states that she can and wants to continue this medication at this time.     HOME MEDICATIONS:  Current  Outpatient Medications:    alendronate (FOSAMAX) 70 MG tablet, Take 1 tablet (70 mg total) by mouth every 7 (seven) days. Take with a full glass of water on an empty stomach., Disp: 4 tablet, Rfl: 11   aspirin EC 81 MG tablet, Take 81 mg by mouth daily., Disp: , Rfl:    Blood Glucose Calibration (ACCU-CHEK AVIVA) SOLN, USE AS DIRECTED, Disp: , Rfl:    Blood Glucose Monitoring Suppl (ACCU-CHEK AVIVA) device, Use daily to check blood sugar daily, Disp: , Rfl:    fluticasone (FLONASE) 50 MCG/ACT nasal spray, Place 2 sprays into both nostrils daily., Disp: , Rfl:    glipiZIDE (GLUCOTROL XL) 5 MG 24 hr tablet, Take 1 tablet (5 mg total) by mouth daily with breakfast., Disp: 30 tablet, Rfl: 5   hydrochlorothiazide (HYDRODIURIL) 25 MG tablet, Take 0.5 tablets (12.5 mg total) by mouth daily., Disp: 45 tablet, Rfl: 1   insulin isophane & regular human KwikPen (NOVOLIN 70/30 KWIKPEN) (70-30) 100 UNIT/ML KwikPen, Inject 14 units into the skin before breakfast and 20 units into skin before supper, Disp: 15 mL, Rfl: 11   Insulin Pen Needle (BD PEN NEEDLE NANO 2ND GEN) 32G X 4 MM MISC, USE TO INJECT INSULIN TWICE DAILY, Disp: 200 each, Rfl: 1   Lancet Device MISC, For use with Onetouch Ultra Lancets to check blood sugar twice daily, Disp: 1 each, Rfl: 0   Lancets (ONETOUCH ULTRASOFT) lancets, FOR USE WITH ONETOUCH ULTRA LANCETS TO CHECK BLOOD SUGAR TWICE DAILY., Disp: 100 each, Rfl: 1   losartan (COZAAR) 25 MG tablet, Take 1 tablet (25 mg total) by mouth daily., Disp: 90 tablet, Rfl: 3   mirabegron ER (MYRBETRIQ) 50 MG TB24 tablet, Take 50 mg by mouth daily., Disp: , Rfl:    Multiple Vitamin (MULTI-VITAMIN DAILY PO), Take 1 tablet by mouth daily., Disp: , Rfl:    omeprazole (PRILOSEC) 40 MG capsule, Take 1 capsule (40 mg total) by mouth 2 (two) times daily., Disp: 180 capsule, Rfl: 0   ONETOUCH ULTRA test strip, USE AS DIRECTED EVERY MORNING AND EVERY NIGHT AT BEDTIME, Disp: 200 strip, Rfl: 0   potassium chloride  SA (KLOR-CON) 20 MEQ tablet, Take 1 tablet (20 mEq total) by mouth daily., Disp: 90 tablet, Rfl: 1   pyridostigmine (MESTINON) 60 MG tablet, Take 0.5 tablets (30 mg total) by mouth 3 (three) times daily., Disp: 126 tablet, Rfl: 5   simvastatin (ZOCOR) 40 MG tablet, Take 1.5 tablets (60 mg total) by mouth daily at 6 PM., Disp: 90 tablet, Rfl: 3   Vitamin D, Cholecalciferol, 10 MCG (400 UNIT) CAPS, Take 2 capsules by mouth daily., Disp: , Rfl:   PAST MEDICAL HISTORY: Past Medical History:  Diagnosis Date   Allergy    Bronchiectasis ([place])    Diabetes mellitus without complication ([place])    Hypertension     PAST SURGICAL HISTORY: Past Surgical History:  Procedure Laterality Date   ABDOMINAL HYSTERECTOMY  BREAST BIOPSY Right    COLONOSCOPY  2021   HAND SURGERY Right    cyst removed   STOMACH SURGERY      FAMILY HISTORY: Family History  Problem Relation Age of Onset   Alcohol abuse Mother    Diabetes Father    Hypertension Sister    Cancer Sister        Throat   Cancer Sister        Breast   Hypertension Brother    Diabetes Brother    Cancer Brother        Prostate   Cancer Brother        Prostate   Cancer Brother        Prostate   Cancer Brother        Prostate   Stroke Daughter     SOCIAL HISTORY:  Social History   Socioeconomic History   Marital status: Widowed    Spouse name: Not on file   Number of children: 2   Years of education: Not on file   Highest education level: Not on file  Occupational History   Occupation: Retired  Tobacco Use   Smoking status: Never   Smokeless tobacco: Never  Vaping Use   Vaping Use: Never used  Substance and Sexual Activity   Alcohol use: Not Currently    Comment: occasional   Drug use: Not Currently   Sexual activity: Not Currently  Other Topics Concern   Not on file  Social History Narrative   Right handed    Lives alone   Caffeine use: Coffee daily      Social Determinants of Health   Financial Resource  Strain: Low Risk    Difficulty of Paying Living Expenses: Not hard at all  Food Insecurity: No Food Insecurity   Worried About Charity fundraiser in the Last Year: Never true   [place] in the Last Year: Never true  Transportation Needs: No Transportation Needs   Lack of Transportation (Medical): No   Lack of Transportation (Non-Medical): No  Physical Activity: Insufficiently Active   Days of Exercise per Week: 2 days   Minutes of Exercise per Session: 30 min  Stress: No Stress Concern Present   Feeling of Stress : Not at all  Social Connections: Moderately Isolated   Frequency of Communication with Friends and Family: More than three times a week   Frequency of Social Gatherings with Friends and Family: More than three times a week   Attends Religious Services: 1 to 4 times per year   Active Member of [employer] or Organizations: No   Attends Archivist Meetings: Never   Marital Status: Widowed  Intimate Partner Violence: Not on file     PHYSICAL EXAM  Vitals:   08/15/21 1101  BP: (!) 145/63  Pulse: 73  Weight: 182 lb (82.6 kg)  Height: 5' 9.5" (1.765 m)    Body mass index is 26.49 kg/m.   General: The patient is well-developed and well-nourished and in no acute distress  HEENT:  Head is [place]/AT.  Sclera are anicteric.  Funduscopic exam shows normal optic discs and retinal vessels.  No papilledema.  Neck: No carotid bruits are noted.  The neck is nontender.  Cardiovascular: The heart has a regular rate and rhythm with a normal S1 and S2. There were no murmurs, gallops or rubs.    Skin: Extremities are without rash or  edema.  Musculoskeletal:  Back is nontender  Neurologic Exam  Mental status: The patient is alert and oriented x 3 at the time of the examination. The patient has apparent normal recent and remote memory, with an apparently normal attention span and concentration ability.   Speech is normal.  Cranial nerves: Extraocular movements are  full. Pupils are equal, round, and reactive to light and accomodation.  Visual fields are full.  Facial symmetry is present. There is good facial sensation to soft touch bilaterally.Facial strength is normal.  Trapezius and sternocleidomastoid strength is normal. No dysarthria is noted.  The tongue is midline, and the patient has symmetric elevation of the soft palate. No obvious hearing deficits are noted.  Motor:  Muscle bulk is normal.   Tone is normal. Strength is 4+/5 in the right biceps and left triceps and elsewhere is 5 / 5   Sensory: Sensory testing is intact to pinprick, soft touch and vibration sensation in all 4 extremities.  Normal sensation in the arms.  Slight reduced vibration sensation in feet.  Coordination: Cerebellar testing reveals good finger-nose-finger and heel-to-shin bilaterally.  Gait and station: Station is normal.   Gait is normal. Tandem gait is normal for age. Romberg is negative.   Reflexes: Deep tendon reflexes are symmetric and normal bilaterally.   Plantar responses are flexor.    DIAGNOSTIC DATA (LABS, IMAGING, TESTING) - I reviewed patient records, labs, notes, testing and imaging myself where available.  Lab Results  Component Value Date   WBC 8.7 08/06/2021   HGB 11.6 (L) 08/06/2021   HCT 37.4 08/06/2021   MCV 70.3 (L) 08/06/2021   PLT 277 08/06/2021      Component Value Date/Time   NA 137 08/08/2021 0258   NA 139 12/17/2020 0000   K 4.2 08/08/2021 0258   CL 103 08/08/2021 0258   CO2 28 08/08/2021 0258   GLUCOSE 199 (H) 08/08/2021 0258   BUN 18 08/08/2021 0258   BUN 24 (A) 12/17/2020 0000   CREATININE 0.75 08/08/2021 0258   CALCIUM 9.3 08/08/2021 0258   PROT 7.0 08/06/2021 1700   ALBUMIN 3.2 (L) 08/06/2021 1700   AST 28 08/06/2021 1700   ALT 19 08/06/2021 1700   ALKPHOS 55 08/06/2021 1700   BILITOT 0.3 08/06/2021 1700   GFRNONAA >60 08/08/2021 0258   Lab Results  Component Value Date   CHOL 238 (H) 06/21/2021   HDL 99.80  06/21/2021   LDLCALC 125 (H) 06/21/2021   TRIG 63.0 06/21/2021   CHOLHDL 2 06/21/2021   Lab Results  Component Value Date   HGBA1C 8.3 (H) 06/21/2021   No results found for: VITAMINB12 Lab Results  Component Value Date   TSH 1.081 08/07/2021       ASSESSMENT AND PLAN  Meningioma (HCC)  Transient alteration of awareness - Plan: EEG adult  Cervical radiculopathy - Plan: MR CERVICAL SPINE WO CONTRAST  Neck pain - Plan: MR CERVICAL SPINE WO CONTRAST   She had an episode of altered awareness without definite loss of consciousness 1 to 2 weeks ago.  She has not had any recurrence.  The etiology is uncertain.  Most likely this represents hypotension, possibly related to eating a large meal.  However, the daughter told her that her right arm shook and I cannot rule out that there was a partial seizure.  Therefore, we need to check an EEG. She has right arm pain and numbness with mild weakness.  This worsens with movements of her neck and she likely has a cervical radiculopathy.  We will check an MRI  of the cervical spine.  Based on results consider gabapentin, referral for injection or referral to neurosurgery. Due to the episode of syncope she had an MRI of the brain that showed a large sphenoid wing meningioma on the left.  It extended towards the sella turcica and appears to encase the terminal left ICA and the M1 segment.  The meningioma appears to be asymptomatic and there does not seem to be extension into the cavernous sinus on MRI.  She reports that she has an appointment to see neurosurgery in a week. Return as needed based on results of the EEG and MRI.  She should call with new or worsening neurologic symptoms.  Thank you for asking me to see Candice Pineda.  Please let me know if I can be of further assistance with her or other patients in the future.   Anetta Olvera A. Felecia Shelling, MD, [facility] 30/05/2329, 07:62 AM Certified in Neurology, Clinical Neurophysiology, Sleep Medicine and  Neuroimaging  [facility] [address], [place],  [address] [phone]

## 2021-08-16 ENCOUNTER — Other Ambulatory Visit: Payer: Self-pay

## 2021-08-16 ENCOUNTER — Ambulatory Visit (INDEPENDENT_AMBULATORY_CARE_PROVIDER_SITE_OTHER): Payer: HMO | Admitting: Family Medicine

## 2021-08-16 ENCOUNTER — Other Ambulatory Visit (HOSPITAL_BASED_OUTPATIENT_CLINIC_OR_DEPARTMENT_OTHER): Payer: Self-pay | Admitting: Neurology

## 2021-08-16 VITALS — BP 124/76 | HR 67 | Temp 97.7°F | Ht 69.5 in | Wt 183.4 lb

## 2021-08-16 DIAGNOSIS — R569 Unspecified convulsions: Secondary | ICD-10-CM | POA: Diagnosis not present

## 2021-08-16 DIAGNOSIS — M5412 Radiculopathy, cervical region: Secondary | ICD-10-CM

## 2021-08-16 DIAGNOSIS — I951 Orthostatic hypotension: Secondary | ICD-10-CM

## 2021-08-16 DIAGNOSIS — E1169 Type 2 diabetes mellitus with other specified complication: Secondary | ICD-10-CM | POA: Diagnosis not present

## 2021-08-16 DIAGNOSIS — D329 Benign neoplasm of meninges, unspecified: Secondary | ICD-10-CM | POA: Diagnosis not present

## 2021-08-16 DIAGNOSIS — Z794 Long term (current) use of insulin: Secondary | ICD-10-CM | POA: Diagnosis not present

## 2021-08-16 DIAGNOSIS — I10 Essential (primary) hypertension: Secondary | ICD-10-CM

## 2021-08-16 DIAGNOSIS — M542 Cervicalgia: Secondary | ICD-10-CM

## 2021-08-16 LAB — CBC
HCT: 41.7 % (ref 36.0–46.0)
Hemoglobin: 13.8 g/dL (ref 12.0–15.0)
MCHC: 33.1 g/dL (ref 30.0–36.0)
MCV: 91.9 fl (ref 78.0–100.0)
Platelets: 199 10*3/uL (ref 150.0–400.0)
RBC: 4.54 Mil/uL (ref 3.87–5.11)
RDW: 12.5 % (ref 11.5–15.5)
WBC: 5.6 10*3/uL (ref 4.0–10.5)

## 2021-08-16 LAB — COMPREHENSIVE METABOLIC PANEL
ALT: 5 U/L (ref 0–35)
AST: 16 U/L (ref 0–37)
Albumin: 4.4 g/dL (ref 3.5–5.2)
Alkaline Phosphatase: 52 U/L (ref 39–117)
BUN: 12 mg/dL (ref 6–23)
CO2: 30 mEq/L (ref 19–32)
Calcium: 9.5 mg/dL (ref 8.4–10.5)
Chloride: 100 mEq/L (ref 96–112)
Creatinine, Ser: 0.85 mg/dL (ref 0.40–1.20)
GFR: 66.4 mL/min (ref >60.00)
Glucose, Bld: 78 mg/dL (ref 70–99)
Potassium: 4.1 mEq/L (ref 3.5–5.1)
Sodium: 136 mEq/L (ref 135–145)
Total Bilirubin: 0.7 mg/dL (ref 0.2–1.2)
Total Protein: 6.6 g/dL (ref 6.0–8.3)

## 2021-08-16 MED ORDER — NOVOLIN 70/30 FLEXPEN (70-30) 100 UNIT/ML ~~LOC~~ SUPN: PEN_INJECTOR | SUBCUTANEOUS | 11 refills | Status: DC

## 2021-08-16 NOTE — Patient Instructions (Signed)
Increase morning insulin to 16 units. Continue evening insulin at 20 units.

## 2021-08-16 NOTE — Telephone Encounter (Signed)
Sent to [employer], sent Cecille Rubin a message she will reach out to the patient to schedule.

## 2021-08-16 NOTE — Progress Notes (Signed)
[facility] [address] [place] [address] Dept: [phone] Dept Fax: [phone]  Office Visit  Subjective:    Patient ID: Candice Pineda, female    DOB: May 22, 1946, 75 y.o..   MRN: [id number]  Chief Complaint  Patient presents with   Hospitalization [facility] f/u for seizure like activity.  Still having some dizziness.     History of Present Illness:  Patient is in today for follow-up from a recent hospitalization. Ms. Ringle was admitted at [facility] from 11/19-11/21 who had experienced a syncopal vs. a seizure episode. She had extensive work-up regarding syncope including EEG, MRI brain, and echocardiogram which was all unremarkable.  There was incidental finding of meningioma which was evaluated by neurosurgery who recommended outpatient follow-up to discuss further management options. Ms. Weider was seen by Dr. Felecia Shelling yesterday. He plans to perform an EEG to assess for possible epilepsy and a cervical MRI to assess for a cervical radiculopathy. As well, Ms. Wieser has been contact by cardiology that they will be sending a 30-day cardiac event monitor for her to wear.  Ms. Mckissack notes that she was told to stop her HCTZ at discharge and follow-up with me. She has not noted any concerns with her blood pressure since stopping this.  Ms. Weatherholtz states her blood sugars are running a bit high. Her fasting sugars have been 99-123. However, she has seen postprandial sugars of 268. She is currently on Novolin 70/30 14 units q am and 20 units q pm.  Ms. Montalban also notes a pending GI appointment. This relates to some bowel incontinence.  Past Medical History: Patient Active Problem List   Diagnosis Date Noted   Meningioma of left sphenoid wing involving cavernous sinus ([place]) 08/16/2021   Seizure-like activity ([place]) 08/06/2021   Bronchiectasis ([place]) 08/06/2021   Elevated sed rate 04/27/2020   Syncope 04/21/2020   Orthostatic  hypotension 04/21/2020   Excessive daytime sleepiness 04/21/2020   Hammertoes of both feet 05/29/2019   Astigmatism with presbyopia, bilateral 11/15/2018   Type 2 diabetes mellitus ([place]) 12/06/2016   Coccygeal pain, chronic 06/15/2016   Osteoporosis 02/23/2016   Elevated ferritin 02/01/2016   Age-related nuclear cataract of both eyes 01/22/2016   Mounier-Kuhn bronchiectasis with acute exacerbation ([place]) 10/21/2015   Breast mass, right 09/14/2015   Renal mass 09/14/2015   Gastrointestinal stromal tumor (GIST) ([place]) 08/26/2015   History of esophageal dilatation 05/25/2015   PVD (posterior vitreous detachment), right eye 02/04/2015   Vitreous floaters of both eyes 02/04/2015   Diverticulosis of large intestine without hemorrhage 01/11/2015   Essential hypertension 09/21/2014   Allergic rhinitis 01/23/2014   Carotid artery stenosis, asymptomatic 01/23/2014   Hypercholesterolemia 01/23/2014   Anemia 01/23/2014   Atopic dermatitis 01/23/2014   Constipation, chronic 01/23/2014   Esophageal reflux 06/19/2013   Background diabetic retinopathy ([place]) 02/22/2013   Generalized anxiety disorder 02/22/2013   Obesity 02/22/2013   Hearing loss 02/22/2013   Urinary incontinence 02/22/2013   Past Surgical History:  Procedure Laterality Date   ABDOMINAL HYSTERECTOMY     BREAST BIOPSY Right    COLONOSCOPY  2021   HAND SURGERY Right    cyst removed   STOMACH SURGERY     Family History  Problem Relation Age of Onset   Alcohol abuse Mother    Diabetes Father    Hypertension Sister    Cancer Sister        Throat   Cancer Sister  Breast   Hypertension Brother    Diabetes Brother    Cancer Brother        Prostate   Cancer Brother        Prostate   Cancer Brother        Prostate   Cancer Brother        Prostate   Stroke Daughter    Outpatient Medications Prior to Visit  Medication Sig Dispense Refill   alendronate (FOSAMAX) 70 MG tablet Take 1 tablet (70 mg total) by mouth  every 7 (seven) days. Take with a full glass of water on an empty stomach. 4 tablet 11   aspirin EC 81 MG tablet Take 81 mg by mouth daily.     Blood Glucose Calibration (ACCU-CHEK AVIVA) SOLN USE AS DIRECTED     Blood Glucose Monitoring Suppl (ACCU-CHEK AVIVA) device Use daily to check blood sugar daily     glipiZIDE (GLUCOTROL XL) 5 MG 24 hr tablet Take 1 tablet (5 mg total) by mouth daily with breakfast. 30 tablet 5   insulin isophane & regular human KwikPen (NOVOLIN 70/30 KWIKPEN) (70-30) 100 UNIT/ML KwikPen Inject 14 units into the skin before breakfast and 20 units into skin before supper 15 mL 11   Insulin Pen Needle (BD PEN NEEDLE NANO 2ND GEN) 32G X 4 MM MISC USE TO INJECT INSULIN TWICE DAILY 200 each 1   Lancet Device MISC For use with Onetouch Ultra Lancets to check blood sugar twice daily 1 each 0   Lancets (ONETOUCH ULTRASOFT) lancets FOR USE WITH ONETOUCH ULTRA LANCETS TO CHECK BLOOD SUGAR TWICE DAILY. 100 each 1   losartan (COZAAR) 25 MG tablet Take 1 tablet (25 mg total) by mouth daily. 90 tablet 3   mirabegron ER (MYRBETRIQ) 50 MG TB24 tablet Take 50 mg by mouth daily.     Multiple Vitamin (MULTI-VITAMIN DAILY PO) Take 1 tablet by mouth daily.     omeprazole (PRILOSEC) 40 MG capsule Take 1 capsule (40 mg total) by mouth 2 (two) times daily. 180 capsule 0   ONETOUCH ULTRA test strip USE AS DIRECTED EVERY MORNING AND EVERY NIGHT AT BEDTIME 200 strip 0   potassium chloride SA (KLOR-CON) 20 MEQ tablet Take 1 tablet (20 mEq total) by mouth daily. 90 tablet 1   pyridostigmine (MESTINON) 60 MG tablet Take 0.5 tablets (30 mg total) by mouth 3 (three) times daily. 126 tablet 5   simvastatin (ZOCOR) 40 MG tablet Take 1.5 tablets (60 mg total) by mouth daily at 6 PM. 90 tablet 3   Vitamin D, Cholecalciferol, 10 MCG (400 UNIT) CAPS Take 2 capsules by mouth daily.     fluticasone (FLONASE) 50 MCG/ACT nasal spray Place 2 sprays into both nostrils daily. (Patient not taking: Reported on  08/16/2021)     hydrochlorothiazide (HYDRODIURIL) 25 MG tablet Take 0.5 tablets (12.5 mg total) by mouth daily. (Patient not taking: Reported on 08/16/2021) 45 tablet 1   No facility-administered medications prior to visit.   Allergies  Allergen Reactions   Celexa [Citalopram Hydrobromide] Other (See Comments)    Drowsiness   Metformin Hcl Diarrhea   Montelukast     Other reaction(s): Other (See Comments) Dizziness Dizziness    Tetanus Toxoids     Arm extremely painful, arm turned red and swollen   Alendronate Nausea Only    And loose stools. Patient states that she can and wants to continue this medication at this time. And loose stools. Patient states that she can and wants  to continue this medication at this time.    Objective:   Today's Vitals   08/16/21 1123  BP: 124/76  Pulse: 67  Temp: 97.7 F (36.5 C)  TempSrc: Temporal  SpO2: 98%  Weight: 183 lb 6.4 oz (83.2 kg)  Height: 5' 9.5" (1.765 m)   Body mass index is 26.7 kg/m.   General: Well developed, well nourished. No acute distress. Psych: Alert and oriented x3. Normal mood and affect.  Health Maintenance Due  Topic Date Due   OPHTHALMOLOGY EXAM  Never done   Hepatitis C Screening  Never done   TETANUS/TDAP  Never done   Zoster Vaccines- Shingrix (1 of 2) Never done   COVID-19 Vaccine (4 - Booster for Pfizer series) 08/27/2020   Imaging MR of Brain w contrast (08/07/2021) IMPRESSION: 1. Homogeneous enhancement of the previously seen lesion along the left sphenoid wing consistent with a meningioma. The lesion encases the left ICA terminus and a portion of the left M1 segment and extends along the planum sphenoidale and anterior aspect of the sella, as above. There is no definite cavernous sinus invasion. 2. Additional 9 mm meningioma overlying the right frontal lobe.    Assessment & Plan:   1. Seizure-like activity [facility]) Reviewed discharge summary and consult notes from Dr. Felecia Shelling. I will order repeat  labs as requested. Plan for EEG through Dr. Felecia Shelling for follow-up on possible seizure activity.  - Comprehensive metabolic panel - CBC  2. Meningioma of left sphenoid wing without invasion of cavernous sinus ([place]) Ms. Atwater has been referred to neurosurgery to evaluate this tumor.  3. Orthostatic hypotension Blood pressures look good currently. I think it is reasonable that she stay off of the HCTZ due to reduce orthostasis.  4. Essential hypertension As above.  5. Type 2 diabetes mellitus with other specified complication, with long-term current use of insulin (HCC) I will increase her morning insulin dose to 16 units. I recommend she continue to monitor this and let me know if the blood sugars are below 60. She has a follow-up appointment with me in 6 weeks.  - insulin isophane & regular human KwikPen (NOVOLIN 70/30 KWIKPEN) (70-30) 100 UNIT/ML KwikPen; Inject 16 units into the skin before breakfast and 20 units into skin before supper  Dispense: 15 mL; Refill: 11  Haydee Salter, MD

## 2021-08-17 ENCOUNTER — Telehealth: Payer: Self-pay | Admitting: Family Medicine

## 2021-08-17 DIAGNOSIS — M6289 Other specified disorders of muscle: Secondary | ICD-10-CM | POA: Diagnosis not present

## 2021-08-17 DIAGNOSIS — N393 Stress incontinence (female) (male): Secondary | ICD-10-CM | POA: Diagnosis not present

## 2021-08-17 DIAGNOSIS — R151 Fecal smearing: Secondary | ICD-10-CM | POA: Diagnosis not present

## 2021-08-17 NOTE — Telephone Encounter (Signed)
Please review and advise. Thanks. Dm/cma  

## 2021-08-17 NOTE — Telephone Encounter (Signed)
Patient notified VIA phone. No further questions. Dm/cma  

## 2021-08-18 NOTE — Telephone Encounter (Signed)
Chart supports Rx Last seen 06/20/21 Next OV 09/27/2021

## 2021-08-23 DIAGNOSIS — R2689 Other abnormalities of gait and mobility: Secondary | ICD-10-CM | POA: Diagnosis not present

## 2021-08-23 DIAGNOSIS — R55 Syncope and collapse: Secondary | ICD-10-CM | POA: Diagnosis not present

## 2021-08-23 DIAGNOSIS — R569 Unspecified convulsions: Secondary | ICD-10-CM | POA: Diagnosis not present

## 2021-08-23 DIAGNOSIS — Z7409 Other reduced mobility: Secondary | ICD-10-CM | POA: Diagnosis not present

## 2021-08-25 DIAGNOSIS — D329 Benign neoplasm of meninges, unspecified: Secondary | ICD-10-CM | POA: Diagnosis not present

## 2021-08-27 ENCOUNTER — Ambulatory Visit (HOSPITAL_BASED_OUTPATIENT_CLINIC_OR_DEPARTMENT_OTHER)
Admission: RE | Admit: 2021-08-27 | Discharge: 2021-08-27 | Disposition: A | Discharge status: Home / Self Care | Payer: HMO | Source: Ambulatory Visit | Attending: Neurology | Admitting: Neurology

## 2021-08-27 ENCOUNTER — Other Ambulatory Visit: Payer: Self-pay

## 2021-08-27 DIAGNOSIS — R200 Anesthesia of skin: Secondary | ICD-10-CM | POA: Diagnosis not present

## 2021-08-27 DIAGNOSIS — M5412 Radiculopathy, cervical region: Secondary | ICD-10-CM | POA: Diagnosis not present

## 2021-08-27 DIAGNOSIS — M542 Cervicalgia: Secondary | ICD-10-CM | POA: Diagnosis not present

## 2021-08-27 DIAGNOSIS — M4802 Spinal stenosis, cervical region: Secondary | ICD-10-CM | POA: Diagnosis not present

## 2021-08-30 ENCOUNTER — Ambulatory Visit: Payer: HMO | Admitting: Neurology

## 2021-08-30 ENCOUNTER — Ambulatory Visit (INDEPENDENT_AMBULATORY_CARE_PROVIDER_SITE_OTHER): Payer: HMO

## 2021-08-30 DIAGNOSIS — R55 Syncope and collapse: Secondary | ICD-10-CM | POA: Diagnosis not present

## 2021-08-30 DIAGNOSIS — R404 Transient alteration of awareness: Secondary | ICD-10-CM

## 2021-09-01 NOTE — Progress Notes (Signed)
° °  [facility]  EEG (ELECTROENCEPHALOGRAM) REPORT   STUDY DATE: 08/30/2021 PATIENT NAME: Candice Pineda DOB: 1946-06-01 MRN: [id number]  ORDERING CLINICIAN: Yaniv Lage A. Izabellah Dadisman, MD. PhD  TECHNIQUE: Electroencephalogram was recorded utilizing standard 10-20 system of lead placement and reformatted into average and bipolar montages.  RECORDING TIME: 23 minutes 46 seconds  CLINICAL INFORMATION: 75 year old woman with syncope/transient alteration of awareness  FINDINGS: A digital EEG was performed while the patient was awake and drowsy. While awake and most alert there was a 10 hz posterior dominant rhythm. Voltages and frequencies were symmetric.  There were no focal, lateralizing, epileptiform activity or seizures seen.  Photic stimulation had a normal driving response. Hyperventilation and recovery did not change the underlying rhythms. EKG channel shows normal sinus rhythm.  She briefly became drowsy but did not enter definitive sleep  IMPRESSION: This is a normal EEG while the patient was awake and drowsy.   INTERPRETING PHYSICIAN:   Dilyn Smiles A. Felecia Shelling, MD, PhD, [facility] Certified in Neurology, Clinical Neurophysiology, Sleep Medicine, Pain Medicine and Neuroimaging  [facility] [address] [place], [place] [address] [phone]

## 2021-09-05 ENCOUNTER — Telehealth: Payer: Self-pay | Admitting: *Deleted

## 2021-09-05 MED ORDER — GABAPENTIN 100 MG PO CAPS: ORAL_CAPSULE | ORAL | 3 refills | Status: DC

## 2021-09-05 NOTE — Telephone Encounter (Signed)
-----   Message from Britt Bottom, MD sent at 09/01/2021  4:33 PM EST ----- 1.   Please let her know that the MRI of the cervical spine showed multilevel degenerative changes with disc protrusions and bone spurs.  This could cause the symptoms into the right arm.  We can start gabapentin 100 mg tablets 1 p.o. every morning, 1 p.o. every afternoon and 2 p.o. nightly  2.   The EEG was normal

## 2021-09-05 NOTE — Telephone Encounter (Signed)
Called and spoke w/ pt about results per Dr. Garth Bigness note. Pt verbalized understanding and agreeable to plan. I e-scribed gabapentin to [id number] - [place], [place] - [address]. Recommended she try taking gabapentin for at least a couple weeks to allow for it to reach max benefit before determining how effective it is. She will call back at that time if ineffective.

## 2021-09-07 DIAGNOSIS — H35363 Drusen (degenerative) of macula, bilateral: Secondary | ICD-10-CM | POA: Diagnosis not present

## 2021-09-07 DIAGNOSIS — H2513 Age-related nuclear cataract, bilateral: Secondary | ICD-10-CM | POA: Diagnosis not present

## 2021-09-07 DIAGNOSIS — H52203 Unspecified astigmatism, bilateral: Secondary | ICD-10-CM | POA: Diagnosis not present

## 2021-09-07 DIAGNOSIS — D329 Benign neoplasm of meninges, unspecified: Secondary | ICD-10-CM | POA: Diagnosis not present

## 2021-09-07 DIAGNOSIS — H04123 Dry eye syndrome of bilateral lacrimal glands: Secondary | ICD-10-CM | POA: Diagnosis not present

## 2021-09-07 DIAGNOSIS — H524 Presbyopia: Secondary | ICD-10-CM | POA: Diagnosis not present

## 2021-09-07 DIAGNOSIS — H5212 Myopia, left eye: Secondary | ICD-10-CM | POA: Diagnosis not present

## 2021-09-07 LAB — HM DIABETES EYE EXAM

## 2021-09-13 ENCOUNTER — Other Ambulatory Visit: Payer: Self-pay | Admitting: Physician Assistant

## 2021-09-13 DIAGNOSIS — J309 Allergic rhinitis, unspecified: Secondary | ICD-10-CM

## 2021-09-16 ENCOUNTER — Other Ambulatory Visit: Payer: Self-pay | Admitting: Nurse Practitioner

## 2021-09-16 DIAGNOSIS — I10 Essential (primary) hypertension: Secondary | ICD-10-CM

## 2021-09-16 DIAGNOSIS — K219 Gastro-esophageal reflux disease without esophagitis: Secondary | ICD-10-CM

## 2021-09-16 NOTE — Telephone Encounter (Signed)
Chart supports Rx Last seen 06/20/21 Next OV 09/27/2021

## 2021-09-21 ENCOUNTER — Ambulatory Visit: Payer: HMO | Admitting: Family Medicine

## 2021-09-23 ENCOUNTER — Ambulatory Visit: Payer: HMO | Admitting: Internal Medicine

## 2021-09-27 ENCOUNTER — Telehealth: Payer: Self-pay

## 2021-09-27 ENCOUNTER — Ambulatory Visit (INDEPENDENT_AMBULATORY_CARE_PROVIDER_SITE_OTHER): Payer: HMO | Admitting: Family Medicine

## 2021-09-27 ENCOUNTER — Other Ambulatory Visit: Payer: Self-pay

## 2021-09-27 ENCOUNTER — Encounter: Payer: Self-pay | Admitting: Family Medicine

## 2021-09-27 VITALS — BP 122/74 | HR 85 | Temp 97.5°F | Ht 69.5 in | Wt 185.2 lb

## 2021-09-27 DIAGNOSIS — D329 Benign neoplasm of meninges, unspecified: Secondary | ICD-10-CM

## 2021-09-27 DIAGNOSIS — M5090 Cervical disc disorder, unspecified, unspecified cervical region: Secondary | ICD-10-CM | POA: Diagnosis not present

## 2021-09-27 DIAGNOSIS — Z1159 Encounter for screening for other viral diseases: Secondary | ICD-10-CM

## 2021-09-27 DIAGNOSIS — I951 Orthostatic hypotension: Secondary | ICD-10-CM

## 2021-09-27 DIAGNOSIS — Z794 Long term (current) use of insulin: Secondary | ICD-10-CM | POA: Diagnosis not present

## 2021-09-27 DIAGNOSIS — M810 Age-related osteoporosis without current pathological fracture: Secondary | ICD-10-CM | POA: Diagnosis not present

## 2021-09-27 DIAGNOSIS — E1169 Type 2 diabetes mellitus with other specified complication: Secondary | ICD-10-CM

## 2021-09-27 DIAGNOSIS — J479 Bronchiectasis, uncomplicated: Secondary | ICD-10-CM | POA: Diagnosis not present

## 2021-09-27 DIAGNOSIS — R32 Unspecified urinary incontinence: Secondary | ICD-10-CM | POA: Diagnosis not present

## 2021-09-27 LAB — GLUCOSE, RANDOM: Glucose, Bld: 48 mg/dL — CL (ref 70–99)

## 2021-09-27 LAB — HEMOGLOBIN A1C: Hgb A1c MFr Bld: 7.3 % — ABNORMAL HIGH (ref 4.6–6.5)

## 2021-09-27 MED ORDER — OXYBUTYNIN CHLORIDE ER 5 MG PO TB24: 5.0000 mg | ORAL_TABLET | Freq: Every day | ORAL | 1 refills | Status: DC

## 2021-09-27 MED ORDER — OYSTER SHELL CALCIUM/D3 500-5 MG-MCG PO TABS
1.0000 | ORAL_TABLET | Freq: Two times a day (BID) | ORAL | 3 refills | Status: AC
Start: 2021-09-27 — End: ?

## 2021-09-27 NOTE — Telephone Encounter (Signed)
Received a Critical Glucose: 48 from lab.   Called patient, she felt fine this morning with a fasting BS 89 this am, ate an egg, and corn beef hash, then took her insulin (16 units). She reports feeling weak when she got home from appointment today, but ate a good lunch and BS at 4:05 pm was 145.    Per Dr Gena Fray continue checking fasting BS and report the numbers to [place] on Friday to see if an adjustment needs to be done.    Patient notified VIA phone and will check fasting sugars in the am and also check afternoon BS, then report to the office (by phone) the numbers. Patient agreeable. Dm/cma

## 2021-09-27 NOTE — Progress Notes (Signed)
[facility] [address] [place] [address] Dept: [phone] Dept Fax: [phone]  Chronic Care Office Visit  Subjective:    Patient ID: Candice Pineda, female    DOB: 09/16/1946, 76 y.o..   MRN: [id number]  Chief Complaint  Patient presents with   Follow-up    3 month f/u.      History of Present Illness:  Patient is in today for reassessment of chronic medical issues.  Candice Pineda has a history of hypertension. She had been managed on HCTZ and losartan. As she was having some orthostatic symptoms the HCTZ was stopped. She tells me that she continues to have an occasional episode upon standing of feeling lightheaded and suddenly flushed.  She has hyperlipidemia and is on a daily aspirin and simvastatin.   Candice Pineda also has a history of Type 2 diabetes and is managed on Novulin 70/30, 16 units each morning and 20 units each evening. She notes her fasting blood sugars have been in the 80s more recently.   Candice Pineda has a history of bronchiectasis. She is currently on Breo Ellipta and albuterol. She does not feel that this has really helped since the switch. At times she still feels some breathlessness.   Candice Pineda has seen by GI in late Nov. related to fecal smearing. She was advised about using daily fiber and was offered PT for pelvic muscle dysfunction, but declined this.   Candice Pineda has a history of osteoporosis and is currently on alendronate and Vitamin D. We had previously discussed her taking calcium as well and she would like a combined supplement.   Candice Pineda had complained of right arm numbness for several months. She denies any associated neck pain. She was referred to neurology and did have a recent CT scan.  Past Medical History: Patient Active Problem List   Diagnosis Date Noted   Meningioma of left sphenoid wing without invasion of cavernous sinus ([place]) 08/16/2021   Seizure-like activity ([place]) 08/06/2021    Bronchiectasis ([place]) 08/06/2021   Elevated sed rate 04/27/2020   Syncope 04/21/2020   Orthostatic hypotension 04/21/2020   Excessive daytime sleepiness 04/21/2020   Hammertoes of both feet 05/29/2019   Astigmatism with presbyopia, bilateral 11/15/2018   Type 2 diabetes mellitus ([place]) 12/06/2016   Coccygeal pain, chronic 06/15/2016   Osteoporosis 02/23/2016   Elevated ferritin 02/01/2016   Age-related nuclear cataract of both eyes 01/22/2016   Mounier-Kuhn bronchiectasis with acute exacerbation ([place]) 10/21/2015   Breast mass, right 09/14/2015   Renal mass 09/14/2015   Gastrointestinal stromal tumor (GIST) ([place]) 08/26/2015   History of esophageal dilatation 05/25/2015   PVD (posterior vitreous detachment), right eye 02/04/2015   Vitreous floaters of both eyes 02/04/2015   Diverticulosis of large intestine without hemorrhage 01/11/2015   Essential hypertension 09/21/2014   Allergic rhinitis 01/23/2014   Carotid artery stenosis, asymptomatic 01/23/2014   Hypercholesterolemia 01/23/2014   Anemia 01/23/2014   Atopic dermatitis 01/23/2014   Constipation, chronic 01/23/2014   Esophageal reflux 06/19/2013   Background diabetic retinopathy ([place]) 02/22/2013   Generalized anxiety disorder 02/22/2013   Obesity 02/22/2013   Hearing loss 02/22/2013   Urinary incontinence 02/22/2013   Past Surgical History:  Procedure Laterality Date   ABDOMINAL HYSTERECTOMY     BREAST BIOPSY Right    COLONOSCOPY  2021   HAND SURGERY Right    cyst removed   STOMACH SURGERY     Family History  Problem Relation Age of Onset  Alcohol abuse Mother    Diabetes Father    Hypertension Sister    Cancer Sister        Throat   Cancer Sister        Breast   Hypertension Brother    Diabetes Brother    Cancer Brother        Prostate   Cancer Brother        Prostate   Cancer Brother        Prostate   Cancer Brother        Prostate   Stroke Daughter    Outpatient Medications Prior to Visit   Medication Sig Dispense Refill   albuterol (VENTOLIN HFA) 108 (90 Base) MCG/ACT inhaler Inhale 2 puffs into the lungs every 4 (four) hours as needed.     alendronate (FOSAMAX) 70 MG tablet Take 1 tablet (70 mg total) by mouth every 7 (seven) days. Take with a full glass of water on an empty stomach. 4 tablet 11   aspirin EC 81 MG tablet Take 81 mg by mouth daily.     Blood Glucose Calibration (ACCU-CHEK AVIVA) SOLN USE AS DIRECTED     Blood Glucose Monitoring Suppl (ACCU-CHEK AVIVA) device Use daily to check blood sugar daily     BREO ELLIPTA 200-25 MCG/ACT AEPB Inhale 1 puff into the lungs daily.     gabapentin (NEURONTIN) 100 MG capsule Take 1 capsule by mouth every morning, 1 capsule every afternoon and 2 capsules at bedtime 120 capsule 3   glipiZIDE (GLUCOTROL XL) 5 MG 24 hr tablet TAKE 1 TABLET BY MOUTH EVERY MORNING 30 tablet 5   insulin isophane & regular human KwikPen (NOVOLIN 70/30 KWIKPEN) (70-30) 100 UNIT/ML KwikPen Inject 16 units into the skin before breakfast and 20 units into skin before supper 15 mL 11   Insulin Pen Needle (BD PEN NEEDLE NANO 2ND GEN) 32G X 4 MM MISC USE TO INJECT INSULIN TWICE DAILY 200 each 1   Lancet Device MISC For use with Onetouch Ultra Lancets to check blood sugar twice daily 1 each 0   Lancets (ONETOUCH ULTRASOFT) lancets FOR USE WITH ONE TOUCH LANCET TO CHECK BLOOD SUGAR TWICE DAILY 100 each 1   Multiple Vitamin (MULTI-VITAMIN DAILY PO) Take 1 tablet by mouth daily.     omeprazole (PRILOSEC) 40 MG capsule TAKE 1 CAPSULE BY MOUTH TWICE DAILY 180 capsule 0   ONETOUCH ULTRA test strip USE AS DIRECTED EVERY MORNING AND EVERY NIGHT AT BEDTIME 200 strip 0   pyridostigmine (MESTINON) 60 MG tablet Take 0.5 tablets (30 mg total) by mouth 3 (three) times daily. 126 tablet 5   simvastatin (ZOCOR) 40 MG tablet Take 1.5 tablets (60 mg total) by mouth daily at 6 PM. 90 tablet 3   triamcinolone cream (KENALOG) 0.1 % Apply topically 2 (two) times daily as needed.      losartan (COZAAR) 25 MG tablet TAKE 1 TABLET(25 MG) BY MOUTH DAILY 90 tablet 3   mirabegron ER (MYRBETRIQ) 50 MG TB24 tablet Take 50 mg by mouth daily.     potassium chloride SA (KLOR-CON M) 20 MEQ tablet TAKE 1 TABLET(20 MEQ) BY MOUTH DAILY 90 tablet 1   Vitamin D, Cholecalciferol, 10 MCG (400 UNIT) CAPS Take 2 capsules by mouth daily.     No facility-administered medications prior to visit.   Allergies  Allergen Reactions   Celexa [Citalopram Hydrobromide] Other (See Comments)    Drowsiness   Metformin Hcl Diarrhea   Montelukast  Other reaction(s): Other (See Comments) Dizziness Dizziness    Tetanus Toxoids     Arm extremely painful, arm turned red and swollen   Alendronate Nausea Only    And loose stools. Patient states that she can and wants to continue this medication at this time. And loose stools. Patient states that she can and wants to continue this medication at this time.     Objective:   Today's Vitals   09/27/21 1023  BP: 122/74  Pulse: 85  Temp: (!) 97.5 F (36.4 C)  TempSrc: Temporal  SpO2: 96%  Weight: 185 lb 3.2 oz (84 kg)  Height: 5' 9.5" (1.765 m)   Body mass index is 26.96 kg/m.   General: Well developed, well nourished. No acute distress. Lungs: Clear to ausculation bilaterally, though with prolonged expiration. No wheezing, rales, or rhonchi. Psych: Alert and oriented. Normal mood and affect.  Health Maintenance Due  Topic Date Due   OPHTHALMOLOGY EXAM  Never done   Hepatitis C Screening  Never done   TETANUS/TDAP  Never done   Zoster Vaccines- Shingrix (1 of 2) Never done   COVID-19 Vaccine (4 - Booster for Pfizer series) 08/27/2020   Lab Results Last CBC Lab Results  Component Value Date   WBC 5.6 08/16/2021   HGB 13.8 08/16/2021   HCT 41.7 08/16/2021   MCV 91.9 08/16/2021   MCH 21.8 (L) 08/06/2021   RDW 12.5 08/16/2021   PLT 199.0 93/23/5573   Last metabolic panel Lab Results  Component Value Date   GLUCOSE 78 08/16/2021    NA 136 08/16/2021   K 4.1 08/16/2021   CL 100 08/16/2021   CO2 30 08/16/2021   BUN 12 08/16/2021   CREATININE 0.85 08/16/2021   GFRNONAA >60 08/08/2021   CALCIUM 9.5 08/16/2021   PROT 6.6 08/16/2021   ALBUMIN 4.4 08/16/2021   BILITOT 0.7 08/16/2021   ALKPHOS 52 08/16/2021   AST 16 08/16/2021   ALT 5 08/16/2021   ANIONGAP 6 08/08/2021   Imaging: MR Cervical Spine (08/27/2021) IMPRESSION: 1. Mild multilevel cervical spinal stenosis related to disc bulging and endplate spurring. Up to mild spinal cord mass effect at C4-C5 and C5-C6, but no spinal cord signal abnormality. 2. Moderate to severe degenerative neural foraminal stenosis at the right C5 nerve level. Mild to moderate foraminal stenosis elsewhere. 3. 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    Assessment & Plan:  1. Orthostatic hypotension Candice Pineda continues to have symptoms suggestive or orthostasis. her blood pressure continues to be in excellent control off her HCTZ. I recommend she stop her losartan and see if those symptoms improve. Addiitonally, she likely no longer needs potassium now that she is not on a diuretic, so she can stoip this as well.  2. Bronchiectasis without complication ([place]) Seeing pulmonary. On Breo and albuterol without improvement. I recommend she reach back to pulmonology about this.  3. Type 2 diabetes mellitus with other specified complication, with long-term current use of insulin (HCC) On insulin and glipizide. Due for repeat A1c. I would consider stopping the glipizide in the future, as she could likely be well controlled on insulin monotherapy.  - Glucose, random - Hemoglobin A1c  4. Meningioma of left sphenoid wing without invasion of cavernous sinus (HCC) Stable. Will continue to follow with neurosurgery.  5. Urinary incontinence, unspecified type Candice Pineda is concerned about the cost of Myrbetriq. I will try switching her to  oxybutynin, which should be  more affordable.  - oxybutynin (DITROPAN-XL) 5 MG 24 hr tablet; Take 1 tablet (5 mg total) by mouth at bedtime.  Dispense: 90 tablet; Refill: 1  6. Age-related osteoporosis without current pathological fracture I will substitute Calcium + Vit D for her Vitamin D alone. - calcium-vitamin D (OSCAL WITH D) 500-5 MG-MCG tablet; Take 1 tablet by mouth 2 (two) times daily.  Dispense: 180 tablet; Refill: 3  7. Encounter for hepatitis C screening test for low risk patient  - HCV Ab w Reflex to Quant PCR  8. Cervical disc disease Engaged in physical therapy. We will continue this for now. Consider neurosurgical referral if pain or arm weakness becomes progressive.   Haydee Salter, MD

## 2021-09-27 NOTE — Patient Instructions (Signed)
Stop potassium Stop losartan (Cozaar)

## 2021-09-28 ENCOUNTER — Telehealth: Payer: Self-pay | Admitting: Family Medicine

## 2021-09-28 ENCOUNTER — Ambulatory Visit: Payer: HMO | Admitting: Internal Medicine

## 2021-09-28 LAB — HCV AB W REFLEX TO QUANT PCR: HCV Ab: <0.1 s/co ratio (ref 0.0–0.9)

## 2021-09-28 LAB — HCV INTERPRETATION

## 2021-09-28 NOTE — Progress Notes (Deleted)
Cardiology Office Note:    Date:  09/28/2021   ID:  Candice Pineda, DOB 1946-06-05, MRN [id number]  PCP:  Haydee Salter, MD   [facility] Providers Cardiologist:  None { Click to update primary MD,subspecialty MD or APP then REFRESH:1}    Referring MD: Haydee Salter, MD   No chief complaint on file. Syncope  History of Present Illness:    Candice Pineda is a 76 y.o. female with a hx of *** referral for syncope  Patient is in today for follow-up from a recent hospitalization. Candice Pineda was admitted at [facility] from 11/19-11/21 who had experienced a syncopal vs. a seizure episode. She had extensive work-up regarding syncope including EEG, MRI brain, and echocardiogram which was all unremarkable.  There was incidental finding of meningioma which was evaluated by neurosurgery who recommended outpatient follow-up to discuss further management options. Candice Pineda was seen by Dr. Felecia Shelling yesterday. He plans to perform an EEG to assess for possible epilepsy and a cervical MRI to assess for a cervical radiculopathy. As well, Ms. Overholt has been contact by cardiology that they will be sending a 30-day cardiac event monitor for her to wear.  Cardiac event monitor bending  Past Medical History:  Diagnosis Date   Allergy    Brain tumor ([place])    Bronchiectasis ([place])    Diabetes mellitus without complication ([place])    Hypertension     Past Surgical History:  Procedure Laterality Date   ABDOMINAL HYSTERECTOMY     BREAST BIOPSY Right    COLONOSCOPY  2021   HAND SURGERY Right    cyst removed   STOMACH SURGERY      Current Medications: No outpatient medications have been marked as taking for the 09/28/21 encounter (Appointment) with Janina Mayo, MD.     Allergies:   Celexa [citalopram hydrobromide], Metformin hcl, Montelukast, Tetanus toxoids, and Alendronate   Social History   Socioeconomic History   Marital status: Widowed    Spouse name: Not on file   Number of children: 2   Years of  education: Not on file   Highest education level: Not on file  Occupational History   Occupation: Retired  Tobacco Use   Smoking status: Never   Smokeless tobacco: Never  Vaping Use   Vaping Use: Never used  Substance and Sexual Activity   Alcohol use: Not Currently    Comment: occasional   Drug use: Not Currently   Sexual activity: Not Currently  Other Topics Concern   Not on file  Social History Narrative   Right handed    Lives alone   Caffeine use: Coffee daily      Social Determinants of Health   Financial Resource Strain: Low Risk    Difficulty of Paying Living Expenses: Not hard at all  Food Insecurity: No Food Insecurity   Worried About Charity fundraiser in the Last Year: Never true   [place] in the Last Year: Never true  Transportation Needs: No Transportation Needs   Lack of Transportation (Medical): No   Lack of Transportation (Non-Medical): No  Physical Activity: Insufficiently Active   Days of Exercise per Week: 2 days   Minutes of Exercise per Session: 30 min  Stress: No Stress Concern Present   Feeling of Stress : Not at all  Social Connections: Moderately Isolated   Frequency of Communication with Friends and Family: More than three times a week   Frequency of Social Gatherings with Friends and  Family: More than three times a week   Attends Religious Services: 1 to 4 times per year   Active Member of Clubs or Organizations: No   Attends Archivist Meetings: Never   Marital Status: Widowed     Family History: The patient's ***family history includes Alcohol abuse in her mother; Cancer in her brother, brother, brother, brother, sister, and sister; Diabetes in her brother and father; Hypertension in her brother and sister; Stroke in her daughter.  ROS:   Please see the history of present illness.    *** All other systems reviewed and are negative.  EKGs/Labs/Other Studies Reviewed:    The following studies were reviewed  today: ***  EKG:  EKG is *** ordered today.  The ekg ordered today demonstrates ***  Recent Labs: 08/07/2021: TSH 1.081 08/08/2021: Magnesium 2.1 08/16/2021: ALT 5; BUN 12; Creatinine, Ser 0.85; Hemoglobin 13.8; Platelets 199.0; Potassium 4.1; Sodium 136  Recent Lipid Panel    Component Value Date/Time   CHOL 238 (H) 06/21/2021 0802   TRIG 63.0 06/21/2021 0802   HDL 99.80 06/21/2021 0802   CHOLHDL 2 06/21/2021 0802   VLDL 12.6 06/21/2021 0802   LDLCALC 125 (H) 06/21/2021 0802     Risk Assessment/Calculations:   {Does this patient have ATRIAL FIBRILLATION?:[id number]}       Physical Exam:    VS:  There were no vitals taken for this visit.    Wt Readings from Last 3 Encounters:  09/27/21 185 lb 3.2 oz (84 kg)  08/16/21 183 lb 6.4 oz (83.2 kg)  08/15/21 182 lb (82.6 kg)     GEN: *** Well nourished, well developed in no acute distress HEENT: Normal NECK: No JVD; No carotid bruits LYMPHATICS: No lymphadenopathy CARDIAC: ***RRR, no murmurs, rubs, gallops RESPIRATORY:  Clear to auscultation without rales, wheezing or rhonchi  ABDOMEN: Soft, non-tender, non-distended MUSCULOSKELETAL:  No edema; No deformity  SKIN: Warm and dry NEUROLOGIC:  Alert and oriented x 3 PSYCHIATRIC:  Normal affect   ASSESSMENT:    No diagnosis found. PLAN:    In order of problems listed above:  ***      {Are you ordering a CV Procedure (e.g. stress test, cath, DCCV, TEE, etc)?   Press F2        :[id number]}    Medication Adjustments/Labs and Tests Ordered: Current medicines are reviewed at length with the patient today.  Concerns regarding medicines are outlined above.  No orders of the defined types were placed in this encounter.  No orders of the defined types were placed in this encounter.   There are no Patient Instructions on file for this visit.   Signed, Janina Mayo, MD  09/28/2021 1:58 PM    [facility]

## 2021-09-28 NOTE — Telephone Encounter (Signed)
Tried to call home number. Unable to leave VM. Will try later. Dm/cma

## 2021-09-28 NOTE — Telephone Encounter (Signed)
Noted. Dm/cma  

## 2021-09-29 ENCOUNTER — Encounter: Payer: Self-pay | Admitting: Internal Medicine

## 2021-09-29 ENCOUNTER — Ambulatory Visit: Payer: HMO | Admitting: Internal Medicine

## 2021-09-29 ENCOUNTER — Other Ambulatory Visit: Payer: Self-pay

## 2021-09-29 DIAGNOSIS — E7800 Pure hypercholesterolemia, unspecified: Secondary | ICD-10-CM

## 2021-09-29 MED ORDER — SIMVASTATIN 80 MG PO TABS: 80.0000 mg | ORAL_TABLET | Freq: Every day | ORAL | 3 refills | Status: DC

## 2021-09-29 NOTE — Telephone Encounter (Signed)
Tried to call home number. Unable to leave VM. Will try later. Dm/cma

## 2021-09-29 NOTE — Patient Instructions (Signed)
Medication Instructions:  INCREASE SIMVASTATIN TO 80mg  ONCE DAILY  *If you need a refill on your cardiac medications before your next appointment, please call your pharmacy*  Follow-Up: At [facility], you and your health needs are our priority.  As part of our continuing mission to provide you with exceptional heart care, we have created designated Provider Care Teams.  These Care Teams include your primary Cardiologist (physician) and Advanced Practice Providers (APPs -  Physician Assistants and Nurse Practitioners) who all work together to provide you with the care you need, when you need it.  We recommend signing up for the patient portal called "MyChart".  Sign up information is provided on this After Visit Summary.  MyChart is used to connect with patients for Virtual Visits (Telemedicine).  Patients are able to view lab/test results, encounter notes, upcoming appointments, etc.  Non-urgent messages can be sent to your provider as well.   To learn more about what you can do with MyChart, go to [email or web address].    Your next appointment:   AS NEEDED   The format for your next appointment:   In Person  Provider:   Janina Mayo, MD

## 2021-09-29 NOTE — Progress Notes (Signed)
Cardiology Office Note:    Date:  09/29/2021   ID:  Candice Pineda, DOB 15-Aug-1946, MRN [id number]  PCP:  Candice Salter, MD   [facility] Providers Cardiologist:  Candice Mayo, MD     Referring MD: Candice Salter, MD   No chief complaint on file. Loss of Consciousness   History of Present Illness:    Candice Pineda is a 76 y.o. female with a hx of DM2, referral for syncope, normal echo, patient s/p cardiac monitor order which is pending referral from [facility] to assess for arrhythmia  She states that on 11/19 she was eating. When she started to stand up she got woozy, and dizzy. She felt her head get hot. She said she didn't loose consciousness but could not talk. She was told that her arm was shaking, had a facial droop and her arm dropped.    Per hospital notes Patient is in today for follow-up from a recent hospitalization. Candice Pineda was admitted at [facility] from 11/19-11/21 who had experienced a syncopal vs. a seizure episode. She had extensive work-up regarding syncope including EEG, MRI brain, and echocardiogram which was all unremarkable.  There was incidental finding of meningioma which was evaluated by neurosurgery who recommended outpatient follow-up to discuss further management options. Candice Pineda was seen by Candice Pineda yesterday. He plans to perform an EEG to assess for possible epilepsy and a cervical MRI to assess for a cervical radiculopathy. As well, Candice Pineda has been contact by cardiology that they will be sending a 30-day cardiac event monitor for her to wear  Her meningioma she was told was benign but unlikely to contribute to seizure. Her EEG was normal.   In December, she was sitting at the table. She felt dizzy and felt the same way. Candice Pineda stopped her BP medicine. She was on losartan and HCTz.  Her symptoms were thought to be related orthostatic hypotension. She wore her event monitor and is sending it in tomorrow. Her blood pressure is in good control today.  She denies  chest pain. She has chronic SOB with bronchiectasis. She was not a consistent smoker. Her daughter had a stroke a few years ago.  She had a stress test years ago. She could not finish it. She did not need LHC.    Past Medical History:  Diagnosis Date   Allergy    Brain tumor ([place])    Bronchiectasis ([place])    Diabetes mellitus without complication ([place])    Hypertension     Past Surgical History:  Procedure Laterality Date   ABDOMINAL HYSTERECTOMY     BREAST BIOPSY Right    COLONOSCOPY  2021   HAND SURGERY Right    cyst removed   STOMACH SURGERY      Current Medications: Current Meds  Medication Sig   albuterol (VENTOLIN HFA) 108 (90 Base) MCG/ACT inhaler Inhale 2 puffs into the lungs every 4 (four) hours as needed.   alendronate (FOSAMAX) 70 MG tablet Take 1 tablet (70 mg total) by mouth every 7 (seven) days. Take with a full glass of water on an empty stomach.   aspirin EC 81 MG tablet Take 81 mg by mouth daily.   Blood Glucose Calibration (ACCU-CHEK AVIVA) SOLN USE AS DIRECTED   Blood Glucose Monitoring Suppl (ACCU-CHEK AVIVA) device Use daily to check blood sugar daily   BREO ELLIPTA 200-25 MCG/ACT AEPB Inhale 1 puff into the lungs daily.   calcium-vitamin D (OSCAL WITH D) 500-5 MG-MCG tablet Take  1 tablet by mouth 2 (two) times daily.   gabapentin (NEURONTIN) 100 MG capsule Take 1 capsule by mouth every morning, 1 capsule every afternoon and 2 capsules at bedtime   glipiZIDE (GLUCOTROL XL) 5 MG 24 hr tablet TAKE 1 TABLET BY MOUTH EVERY MORNING   insulin isophane & regular human KwikPen (NOVOLIN 70/30 KWIKPEN) (70-30) 100 UNIT/ML KwikPen Inject 16 units into the skin before breakfast and 20 units into skin before supper   Insulin Pen Needle (BD PEN NEEDLE NANO 2ND GEN) 32G X 4 MM MISC USE TO INJECT INSULIN TWICE DAILY   Lancet Device MISC For use with Onetouch Ultra Lancets to check blood sugar twice daily   Lancets (ONETOUCH ULTRASOFT) lancets FOR USE WITH ONE TOUCH LANCET TO  CHECK BLOOD SUGAR TWICE DAILY   Multiple Vitamin (MULTI-VITAMIN DAILY PO) Take 1 tablet by mouth daily.   omeprazole (PRILOSEC) 40 MG capsule TAKE 1 CAPSULE BY MOUTH TWICE DAILY   ONETOUCH ULTRA test strip USE AS DIRECTED EVERY MORNING AND EVERY NIGHT AT BEDTIME   oxybutynin (DITROPAN-XL) 5 MG 24 hr tablet Take 1 tablet (5 mg total) by mouth at bedtime.   pyridostigmine (MESTINON) 60 MG tablet Take 0.5 tablets (30 mg total) by mouth 3 (three) times daily.   triamcinolone cream (KENALOG) 0.1 % Apply topically 2 (two) times daily as needed.   [DISCONTINUED] simvastatin (ZOCOR) 40 MG tablet Take 1.5 tablets (60 mg total) by mouth daily at 6 PM.     Allergies:   Celexa [citalopram hydrobromide], Metformin hcl, Montelukast, Tetanus toxoids, and Alendronate   Social History   Socioeconomic History   Marital status: Widowed    Spouse name: Not on file   Number of children: 2   Years of education: Not on file   Highest education level: Not on file  Occupational History   Occupation: Retired  Tobacco Use   Smoking status: Never   Smokeless tobacco: Never  Vaping Use   Vaping Use: Never used  Substance and Sexual Activity   Alcohol use: Not Currently    Comment: occasional   Drug use: Not Currently   Sexual activity: Not Currently  Other Topics Concern   Not on file  Social History Narrative   Right handed    Lives alone   Caffeine use: Coffee daily      Social Determinants of Health   Financial Resource Strain: Low Risk    Difficulty of Paying Living Expenses: Not hard at all  Food Insecurity: No Food Insecurity   Worried About Charity fundraiser in the Last Year: Never true   [place] in the Last Year: Never true  Transportation Needs: No Transportation Needs   Lack of Transportation (Medical): No   Lack of Transportation (Non-Medical): No  Physical Activity: Insufficiently Active   Days of Exercise per Week: 2 days   Minutes of Exercise per Session: 30 min   Stress: No Stress Concern Present   Feeling of Stress : Not at all  Social Connections: Moderately Isolated   Frequency of Communication with Friends and Family: More than three times a week   Frequency of Social Gatherings with Friends and Family: More than three times a week   Attends Religious Services: 1 to 4 times per year   Active Member of [employer] or Organizations: No   Attends Archivist Meetings: Never   Marital Status: Widowed     Family History: The patient's family history includes Alcohol abuse in her  mother; Cancer in her brother, brother, brother, brother, sister, and sister; Diabetes in her brother and father; Hypertension in her brother and sister; Stroke in her daughter.  ROS:   Please see the history of present illness.     All other systems reviewed and are negative.  EKGs/Labs/Other Studies Reviewed:    The following studies were reviewed today:   EKG:  EKG is  ordered today.  The ekg ordered today demonstrates   NSR, LAD, poor r wave progression  Recent Labs: 08/07/2021: TSH 1.081 08/08/2021: Magnesium 2.1 08/16/2021: ALT 5; BUN 12; Creatinine, Ser 0.85; Hemoglobin 13.8; Platelets 199.0; Potassium 4.1; Sodium 136  Recent Lipid Panel    Component Value Date/Time   CHOL 238 (H) 06/21/2021 0802   TRIG 63.0 06/21/2021 0802   HDL 99.80 06/21/2021 0802   CHOLHDL 2 06/21/2021 0802   VLDL 12.6 06/21/2021 0802   LDLCALC 125 (H) 06/21/2021 0802     Risk Assessment/Calculations:           Physical Exam:    VS:  BP (!) 116/58    Pulse 73    Ht 5' 9.5" (1.765 m)    Wt 182 lb 6.4 oz (82.7 kg)    SpO2 98%    BMI 26.55 kg/m     Wt Readings from Last 3 Encounters:  09/29/21 182 lb 6.4 oz (82.7 kg)  09/27/21 185 lb 3.2 oz (84 kg)  08/16/21 183 lb 6.4 oz (83.2 kg)     GEN:  Well nourished, well developed in no acute distress HEENT: Normal NECK: No JVD; No carotid bruits LYMPHATICS: No lymphadenopathy CARDIAC: RRR, no murmurs, rubs,  gallops RESPIRATORY:  Clear to auscultation without rales, wheezing or rhonchi  ABDOMEN:  non-distended MUSCULOSKELETAL:  No edema; No deformity  SKIN: Warm and dry NEUROLOGIC:  Alert and oriented x 3 PSYCHIATRIC:  Normal affect   ASSESSMENT:    #Loss of consciousness: She had stroke like symptoms but her MRI did not show acute stroke. She has a meningioma which encases the left ICA and M1 segment.  Her echo was normal. She has no palpitations, no hx of atrial fibrillation. She was in sinus rhythm. She had symptoms of orthostatic hypotension which has improved off of ant-hypertensives. I have low suspicion for a cardiac cause. She had a monitor ordered and is planning to send it in tomorrow. - will FU on cardiac monitor - if it is normal, she can follow up on an as needed basis  #HLD: at least < 100mg /dL - increased simvastatin 80 mg daily - can repeat lipid profile with PCP  #HTN: agree off antihypertensives. Blood pressures are normal  PLAN:    In order of problems listed above:  Simvastatin 80 mg daily Pending cardiac monitor results Follow up as needed        Medication Adjustments/Labs and Tests Ordered: Current medicines are reviewed at length with the patient today.  Concerns regarding medicines are outlined above.  Orders Placed This Encounter  Procedures   EKG 12-Lead   Meds ordered this encounter  Medications   simvastatin (ZOCOR) 80 MG tablet    Sig: Take 1 tablet (80 mg total) by mouth daily at 6 PM.    Dispense:  90 tablet    Refill:  3    Refill plan. Patient will adjust dose to current medication.    Patient Instructions  Medication Instructions:  INCREASE SIMVASTATIN TO 80mg  ONCE DAILY  *If you need a refill on your cardiac medications before  your next appointment, please call your pharmacy*  Follow-Up: At [facility], you and your health needs are our priority.  As part of our continuing mission to provide you with exceptional heart care, we  have created designated Provider Care Teams.  These Care Teams include your primary Cardiologist (physician) and Advanced Practice Providers (APPs -  Physician Assistants and Nurse Practitioners) who all work together to provide you with the care you need, when you need it.  We recommend signing up for the patient portal called "MyChart".  Sign up information is provided on this After Visit Summary.  MyChart is used to connect with patients for Virtual Visits (Telemedicine).  Patients are able to view lab/test results, encounter notes, upcoming appointments, etc.  Non-urgent messages can be sent to your provider as well.   To learn more about what you can do with MyChart, go to [email or web address].    Your next appointment:   AS NEEDED   The format for your next appointment:   In Person  Provider:   Janina Mayo, MD     Signed, Candice Mayo, MD  09/29/2021 3:00 PM    [place]

## 2021-09-29 NOTE — Telephone Encounter (Signed)
Called patient on cell number advised that she can take the OTC Oscal/vit d.  Dm/cma

## 2021-09-30 ENCOUNTER — Telehealth: Payer: Self-pay

## 2021-09-30 NOTE — Telephone Encounter (Signed)
Unable to leave VM. Will try later. Dm/cma  

## 2021-09-30 NOTE — Telephone Encounter (Signed)
Patient notified VIA phone and no further questions.  Dm/cma

## 2021-09-30 NOTE — Telephone Encounter (Signed)
Patient called in with her BS readings fro this week:  1/10:  89 am, 145 pm 1/11:  90 am, 93 pm 1/12: 123 am 129 pm 1/13:  137 am  She also reports that this morning her BP is 95/60, she feels normal and saw cardiology yesterday.    Please review and advise.   Thanks. Dm/cma

## 2021-10-08 ENCOUNTER — Other Ambulatory Visit: Payer: Self-pay | Admitting: Nurse Practitioner

## 2021-10-08 DIAGNOSIS — E1169 Type 2 diabetes mellitus with other specified complication: Secondary | ICD-10-CM

## 2021-10-08 DIAGNOSIS — Z794 Long term (current) use of insulin: Secondary | ICD-10-CM

## 2021-10-16 ENCOUNTER — Other Ambulatory Visit: Payer: Self-pay | Admitting: Nurse Practitioner

## 2021-10-16 DIAGNOSIS — I10 Essential (primary) hypertension: Secondary | ICD-10-CM

## 2021-11-05 ENCOUNTER — Other Ambulatory Visit: Payer: Self-pay | Admitting: Nurse Practitioner

## 2021-11-05 DIAGNOSIS — Z794 Long term (current) use of insulin: Secondary | ICD-10-CM

## 2021-11-05 DIAGNOSIS — E1169 Type 2 diabetes mellitus with other specified complication: Secondary | ICD-10-CM

## 2021-11-07 ENCOUNTER — Other Ambulatory Visit: Payer: Self-pay | Admitting: Nurse Practitioner

## 2021-11-07 DIAGNOSIS — I10 Essential (primary) hypertension: Secondary | ICD-10-CM

## 2021-11-11 DIAGNOSIS — K130 Diseases of lips: Secondary | ICD-10-CM | POA: Diagnosis not present

## 2021-11-16 ENCOUNTER — Encounter: Payer: Self-pay | Admitting: Nurse Practitioner

## 2021-11-16 ENCOUNTER — Other Ambulatory Visit: Payer: Self-pay

## 2021-11-16 ENCOUNTER — Ambulatory Visit (INDEPENDENT_AMBULATORY_CARE_PROVIDER_SITE_OTHER): Payer: HMO | Admitting: Nurse Practitioner

## 2021-11-16 VITALS — BP 132/74 | HR 72 | Temp 96.6°F | Wt 189.8 lb

## 2021-11-16 DIAGNOSIS — M25473 Effusion, unspecified ankle: Secondary | ICD-10-CM | POA: Diagnosis not present

## 2021-11-16 DIAGNOSIS — L0291 Cutaneous abscess, unspecified: Secondary | ICD-10-CM | POA: Diagnosis not present

## 2021-11-16 MED ORDER — SULFAMETHOXAZOLE-TRIMETHOPRIM 800-160 MG PO TABS: 1.0000 | ORAL_TABLET | Freq: Two times a day (BID) | ORAL | 0 refills | Status: DC

## 2021-11-16 NOTE — Assessment & Plan Note (Signed)
2 small abscesses to right breast. She states that she has a history of these in the past that had turned into sepsis. Vital signs stable today. Will treat with bactrim 1 tablet twice a day x10 days. She can use warm compresses for 10 minutes 4 times a day. Follow up if symptoms worsen or don't improve.  ?

## 2021-11-16 NOTE — Assessment & Plan Note (Signed)
Acute ankle swelling for the last month. She was taken off HCTZ back in November due to dizziness and syncope. BP is well controlled today. Encouraged her to wear knee high compression socks daily and then take them off at night. She can also elevated her legs when sitting. Limit the amount of sodium in her diet. Keep next scheduled appointment with PCP next month.  ?

## 2021-11-16 NOTE — Patient Instructions (Signed)
It was great to see you! ? ?Start antibiotic 1 pill twice a day. You can use warm compresses on your breast and abdomen for 10-15 minutes 4 times a day.  ? ?You can wear compression socks to help with the swelling in your feet. Also prop your legs up when you are sitting and limit the amount of salt you are eating. ? ?Let's follow-up if your symptoms worsen or don't improve.  ? ?Take care, ? ?Vance Peper, NP ? ?

## 2021-11-16 NOTE — Progress Notes (Signed)
Acute Office Visit  Subjective:    Patient ID: Candice Pineda, female    DOB: [birth year], 76 y.o.   MRN: [id number]  Chief Complaint  Patient presents with   Rash    Pt c/o rash-like spot on the side of rt breast w/ similar rash outbreak on bottom lip for several days.     HPI Patient is in today for rash on her right breast. She also noticed some cracking on her lips and went to urgent care on 11/11/21 and was diagnosed with angular chelitis and was treated with mupriocin and vaseline. This has improved her symptoms.   RASH  Duration: this morning Location:  right breast   Itching: no Burning: no Redness: yes Oozing: no Scaling: no Blisters: no Painful: yes - tender Fevers: no Change in detergents/soaps/personal care products: no Recent illness: no Recent travel:no History of same: yes Context: worse Alleviating factors: none Treatments attempted: triamcinolone  Shortness of breath: yes - chronic Throat/tongue swelling: no Myalgias/arthralgias: yes - chronic  She also noticed swelling her both feet and ankles for the last month. She was taking lisinopril-hctz however was taken off of it with her dizziness and syncopal episode in November. She endorses chronic shortness of breath. Denies chest pain.    Past Medical History:  Diagnosis Date   Allergy    Brain tumor ([place])    Bronchiectasis ([place])    Diabetes mellitus without complication ([place])    Hypertension     Past Surgical History:  Procedure Laterality Date   ABDOMINAL HYSTERECTOMY     BREAST BIOPSY Right    COLONOSCOPY  2021   HAND SURGERY Right    cyst removed   STOMACH SURGERY      Family History  Problem Relation Age of Onset   Alcohol abuse Mother    Diabetes Father    Hypertension Sister    Cancer Sister        Throat   Cancer Sister        Breast   Hypertension Brother    Diabetes Brother    Cancer Brother        Prostate   Cancer Brother        Prostate   Cancer Brother        Prostate    Cancer Brother        Prostate   Stroke Daughter     Social History   Socioeconomic History   Marital status: Widowed    Spouse name: Not on file   Number of children: 2   Years of education: Not on file   Highest education level: Not on file  Occupational History   Occupation: Retired  Tobacco Use   Smoking status: Never   Smokeless tobacco: Never  Vaping Use   Vaping Use: Never used  Substance and Sexual Activity   Alcohol use: Not Currently    Comment: occasional   Drug use: Not Currently   Sexual activity: Not Currently  Other Topics Concern   Not on file  Social History Narrative   Right handed    Lives alone   Caffeine use: Coffee daily      Social Determinants of Health   Financial Resource Strain: Low Risk    Difficulty of Paying Living Expenses: Not hard at all  Food Insecurity: No Food Insecurity   Worried About Charity fundraiser in the Last Year: Never true   [place] in the Last Year: Never true  Transportation  Needs: No Transportation Needs   Lack of Transportation (Medical): No   Lack of Transportation (Non-Medical): No  Physical Activity: Insufficiently Active   Days of Exercise per Week: 2 days   Minutes of Exercise per Session: 30 min  Stress: No Stress Concern Present   Feeling of Stress : Not at all  Social Connections: Moderately Isolated   Frequency of Communication with Friends and Family: More than three times a week   Frequency of Social Gatherings with Friends and Family: More than three times a week   Attends Religious Services: 1 to 4 times per year   Active Member of [employer] or Organizations: No   Attends Archivist Meetings: Never   Marital Status: Widowed  Intimate Partner Violence: Not on file    Outpatient Medications Prior to Visit  Medication Sig Dispense Refill   albuterol (VENTOLIN HFA) 108 (90 Base) MCG/ACT inhaler Inhale 2 puffs into the lungs every 4 (four) hours as needed.     alendronate  (FOSAMAX) 70 MG tablet Take 1 tablet (70 mg total) by mouth every 7 (seven) days. Take with a full glass of water on an empty stomach. 4 tablet 11   aspirin EC 81 MG tablet Take 81 mg by mouth daily.     Blood Glucose Calibration (ACCU-CHEK AVIVA) SOLN USE AS DIRECTED     Blood Glucose Monitoring Suppl (ACCU-CHEK AVIVA) device Use daily to check blood sugar daily     BREO ELLIPTA 200-25 MCG/ACT AEPB Inhale 1 puff into the lungs daily.     calcium-vitamin D (OSCAL WITH D) 500-5 MG-MCG tablet Take 1 tablet by mouth 2 (two) times daily. 180 tablet 3   gabapentin (NEURONTIN) 100 MG capsule Take 1 capsule by mouth every morning, 1 capsule every afternoon and 2 capsules at bedtime 120 capsule 3   glipiZIDE (GLUCOTROL XL) 5 MG 24 hr tablet TAKE 1 TABLET BY MOUTH EVERY MORNING 30 tablet 5   insulin isophane & regular human KwikPen (NOVOLIN 70/30 KWIKPEN) (70-30) 100 UNIT/ML KwikPen Inject 16 units into the skin before breakfast and 20 units into skin before supper 15 mL 11   Insulin Pen Needle (BD PEN NEEDLE NANO 2ND GEN) 32G X 4 MM MISC USE TO INJECT INSULIN TWICE DAILY AS DIRECTED 200 each 1   Lancet Device MISC For use with Onetouch Ultra Lancets to check blood sugar twice daily 1 each 0   Lancets (ONETOUCH ULTRASOFT) lancets FOR USE WITH ONE TOUCH LANCET TO CHECK BLOOD SUGAR TWICE DAILY 100 each 1   Multiple Vitamin (MULTI-VITAMIN DAILY PO) Take 1 tablet by mouth daily.     omeprazole (PRILOSEC) 40 MG capsule TAKE 1 CAPSULE BY MOUTH TWICE DAILY 180 capsule 0   ONETOUCH ULTRA test strip USE ONCE EVERY MORNING AND ONCE EVERY NIGHT AT BEDTIME 200 strip 0   oxybutynin (DITROPAN-XL) 5 MG 24 hr tablet Take 1 tablet (5 mg total) by mouth at bedtime. 90 tablet 1   pyridostigmine (MESTINON) 60 MG tablet Take 0.5 tablets (30 mg total) by mouth 3 (three) times daily. 126 tablet 5   simvastatin (ZOCOR) 80 MG tablet Take 1 tablet (80 mg total) by mouth daily at 6 PM. 90 tablet 3   triamcinolone cream (KENALOG) 0.1  % Apply topically 2 (two) times daily as needed.     No facility-administered medications prior to visit.    Allergies  Allergen Reactions   Celexa [Citalopram Hydrobromide] Other (See Comments)    Drowsiness   Metformin Hcl  Diarrhea   Montelukast     Other reaction(s): Other (See Comments) Dizziness Dizziness    Tetanus Toxoids     Arm extremely painful, arm turned red and swollen   Alendronate Nausea Only    And loose stools. Patient states that she can and wants to continue this medication at this time. And loose stools. Patient states that she can and wants to continue this medication at this time.     Review of Systems See pertinent positives and negatives per HPI.    Objective:    Physical Exam Vitals and nursing note reviewed.  Constitutional:      General: She is not in acute distress.    Appearance: Normal appearance.  HENT:     Head: Normocephalic.  Eyes:     Conjunctiva/sclera: Conjunctivae normal.  Cardiovascular:     Rate and Rhythm: Normal rate and regular rhythm.     Pulses: Normal pulses.     Heart sounds: Normal heart sounds.  Pulmonary:     Effort: Pulmonary effort is normal.     Breath sounds: Normal breath sounds.  Musculoskeletal:        General: Deformity: 1+ pitting.     Cervical back: Normal range of motion.     Right lower leg: Edema present.     Left lower leg: Edema (1+ pitting) present.  Skin:    General: Skin is warm.     Findings: Erythema present.     Comments: 2 small, circular areas of raised redness to right breast. No drainage or warmth.   Neurological:     General: No focal deficit present.     Mental Status: She is alert and oriented to person, place, and time.  Psychiatric:        Mood and Affect: Mood normal.        Behavior: Behavior normal.        Thought Content: Thought content normal.        Judgment: Judgment normal.    BP 132/74    Pulse 72    Temp (!) 96.6 F (35.9 C) (Temporal)    Wt 189 lb 12.8 oz (86.1  kg)    SpO2 98%    BMI 27.63 kg/m  Wt Readings from Last 3 Encounters:  11/16/21 189 lb 12.8 oz (86.1 kg)  09/29/21 182 lb 6.4 oz (82.7 kg)  09/27/21 185 lb 3.2 oz (84 kg)    Health Maintenance Due  Topic Date Due   TETANUS/TDAP  Never done   Zoster Vaccines- Shingrix (1 of 2) Never done   COVID-19 Vaccine (4 - Booster for Pfizer series) 08/27/2020    There are no preventive care reminders to display for this patient.   Lab Results  Component Value Date   TSH 1.081 08/07/2021   Lab Results  Component Value Date   WBC 5.6 08/16/2021   HGB 13.8 08/16/2021   HCT 41.7 08/16/2021   MCV 91.9 08/16/2021   PLT 199.0 08/16/2021   Lab Results  Component Value Date   NA 136 08/16/2021   K 4.1 08/16/2021   CO2 30 08/16/2021   GLUCOSE 48 (LL) 09/27/2021   BUN 12 08/16/2021   CREATININE 0.85 08/16/2021   BILITOT 0.7 08/16/2021   ALKPHOS 52 08/16/2021   AST 16 08/16/2021   ALT 5 08/16/2021   PROT 6.6 08/16/2021   ALBUMIN 4.4 08/16/2021   CALCIUM 9.5 08/16/2021   ANIONGAP 6 08/08/2021   GFR 66.40 08/16/2021   Lab Results  Component Value Date   CHOL 238 (H) 06/21/2021   Lab Results  Component Value Date   HDL 99.80 06/21/2021   Lab Results  Component Value Date   LDLCALC 125 (H) 06/21/2021   Lab Results  Component Value Date   TRIG 63.0 06/21/2021   Lab Results  Component Value Date   CHOLHDL 2 06/21/2021   Lab Results  Component Value Date   HGBA1C 7.3 (H) 09/27/2021       Assessment & Plan:   Problem List Items Addressed This Visit       Other   Abscess - Primary    2 small abscesses to right breast. She states that she has a history of these in the past that had turned into sepsis. Vital signs stable today. Will treat with bactrim 1 tablet twice a day x10 days. She can use warm compresses for 10 minutes 4 times a day. Follow up if symptoms worsen or don't improve.       Ankle edema    Acute ankle swelling for the last month. She was taken off  HCTZ back in November due to dizziness and syncope. BP is well controlled today. Encouraged her to wear knee high compression socks daily and then take them off at night. She can also elevated her legs when sitting. Limit the amount of sodium in her diet. Keep next scheduled appointment with PCP next month.         Meds ordered this encounter  Medications   sulfamethoxazole-trimethoprim (BACTRIM DS) 800-160 MG tablet    Sig: Take 1 tablet by mouth 2 (two) times daily.    Dispense:  20 tablet    Refill:  0     Charyl Dancer, NP

## 2021-11-22 IMAGING — MG DIGITAL SCREENING BILAT W/ TOMO W/ CAD
8 of 14 series · 8 of 40 positions shown · non-contrast
Comparison: Previous exam(s).

CLINICAL DATA: Screening.

EXAM:
DIGITAL SCREENING BILATERAL MAMMOGRAM WITH TOMO AND CAD

[R XCCL synth-2D]
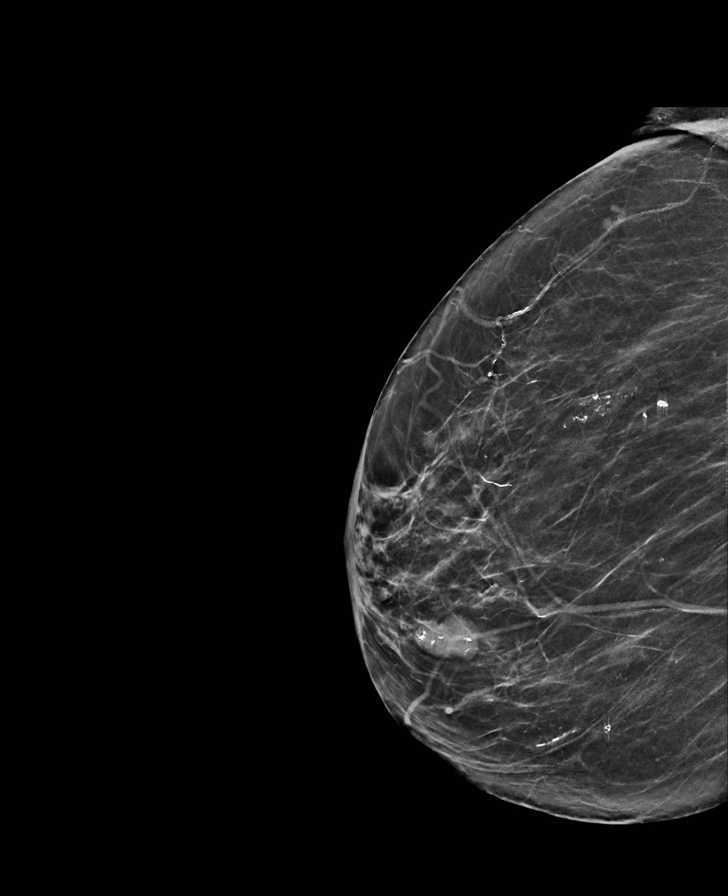

[R CC synth-2D]
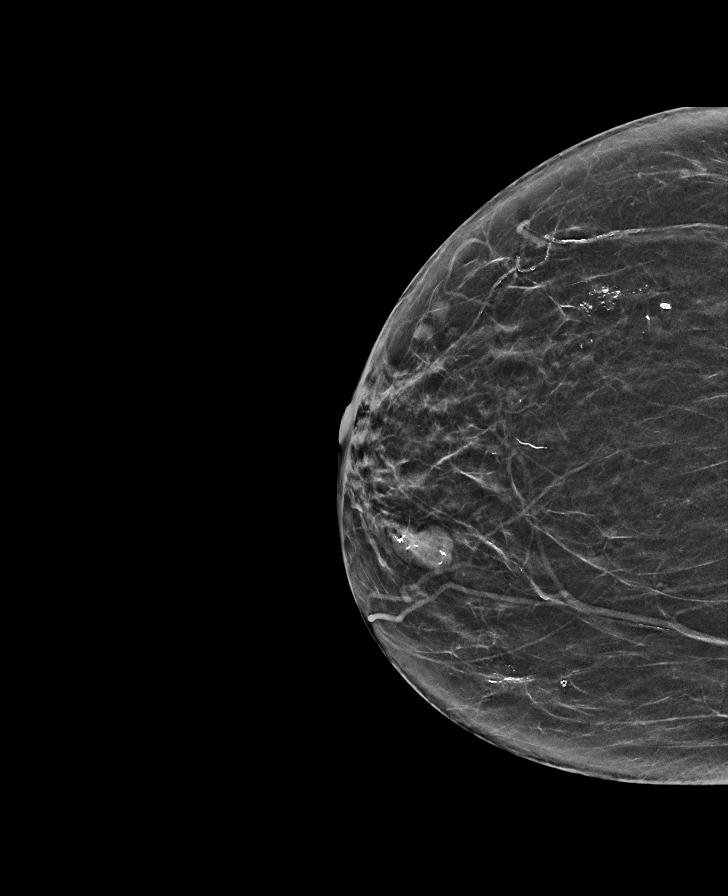

[L CC synth-2D]
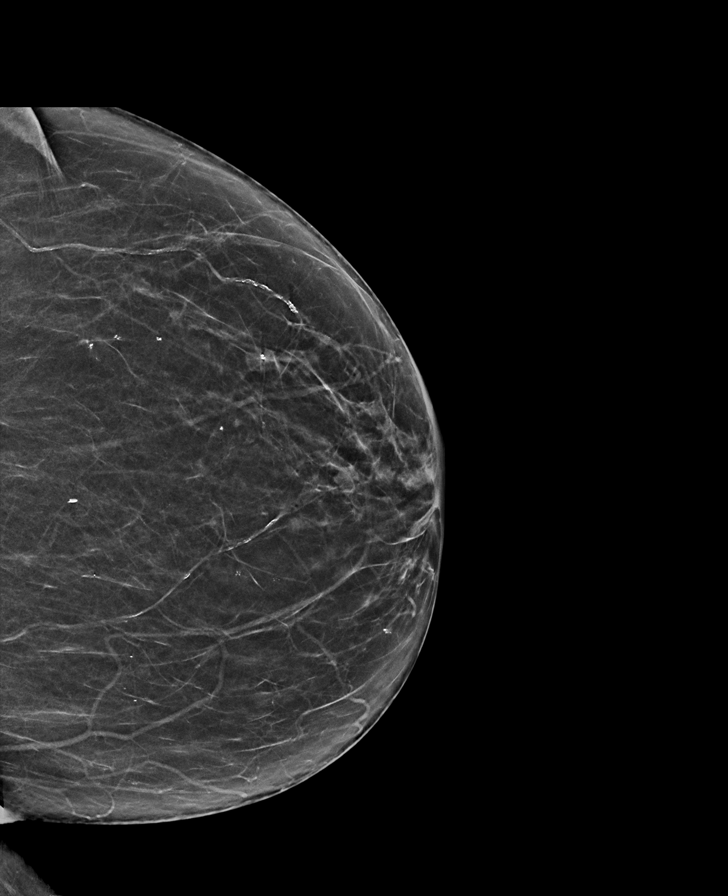

[R MLO synth-2D]
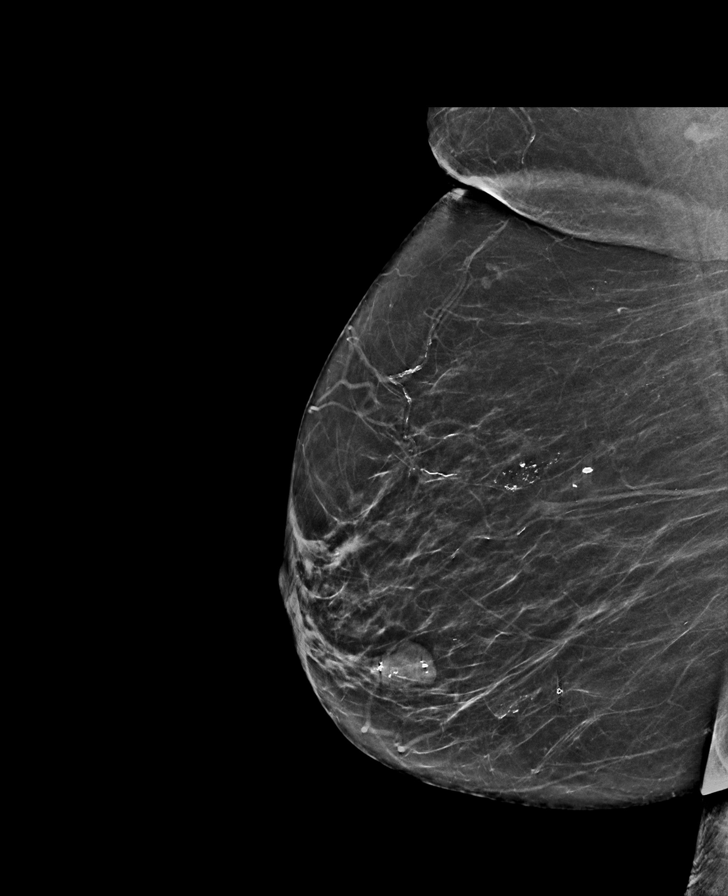

[L MLO synth-2D (1 of 2)]
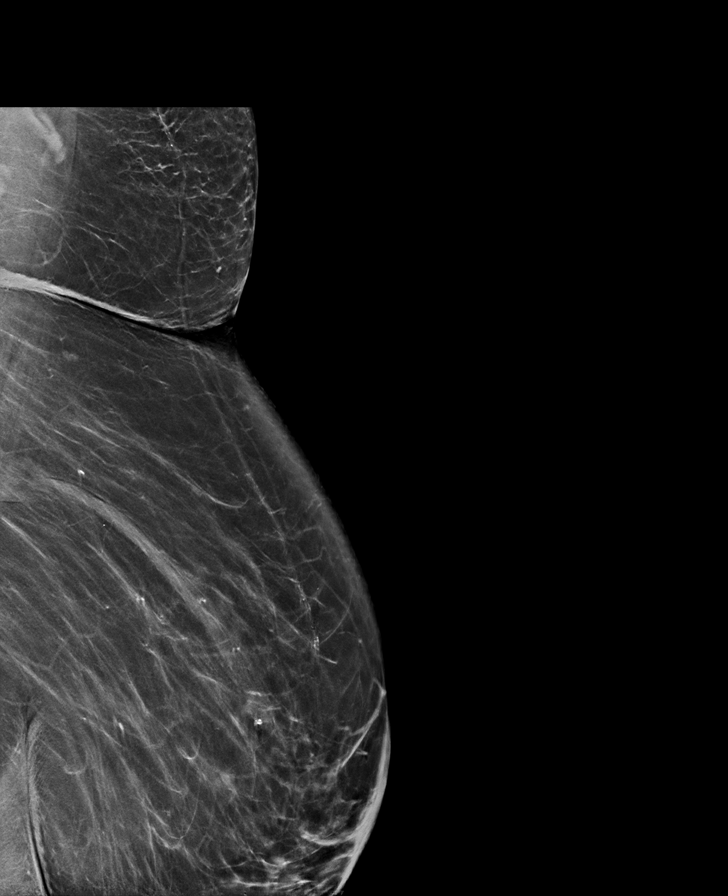

[L XCCL synth-2D]
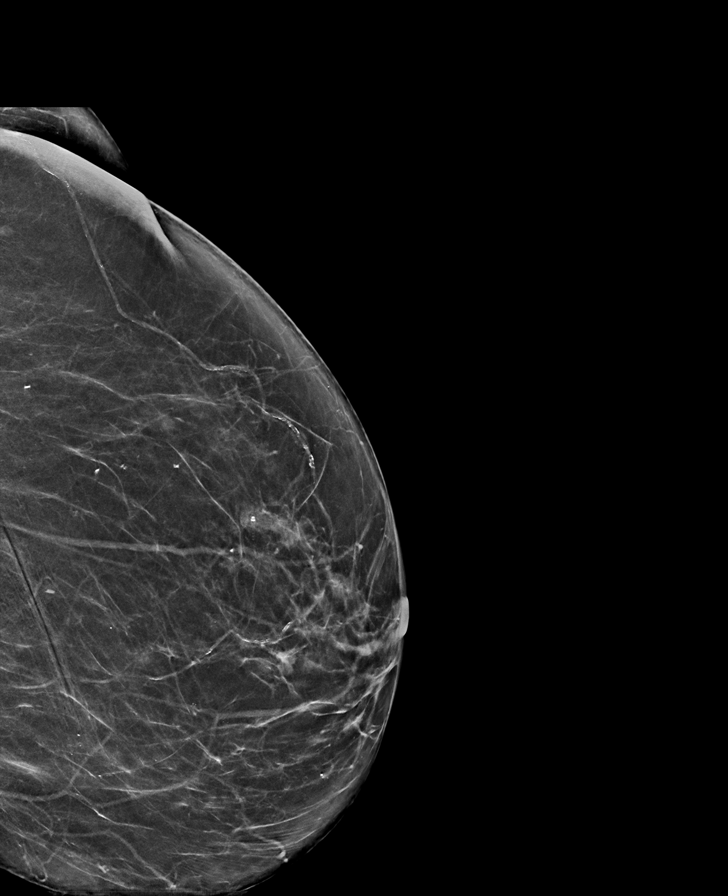

[L MLO synth-2D (2 of 2)]
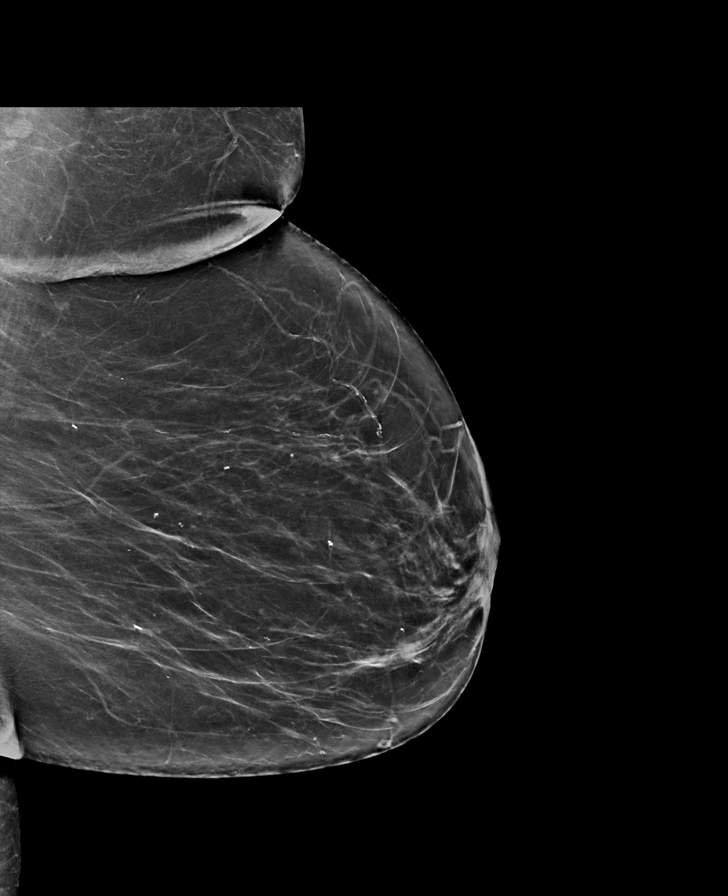

[R XCCL tomo · tomo slice 30/59.0]
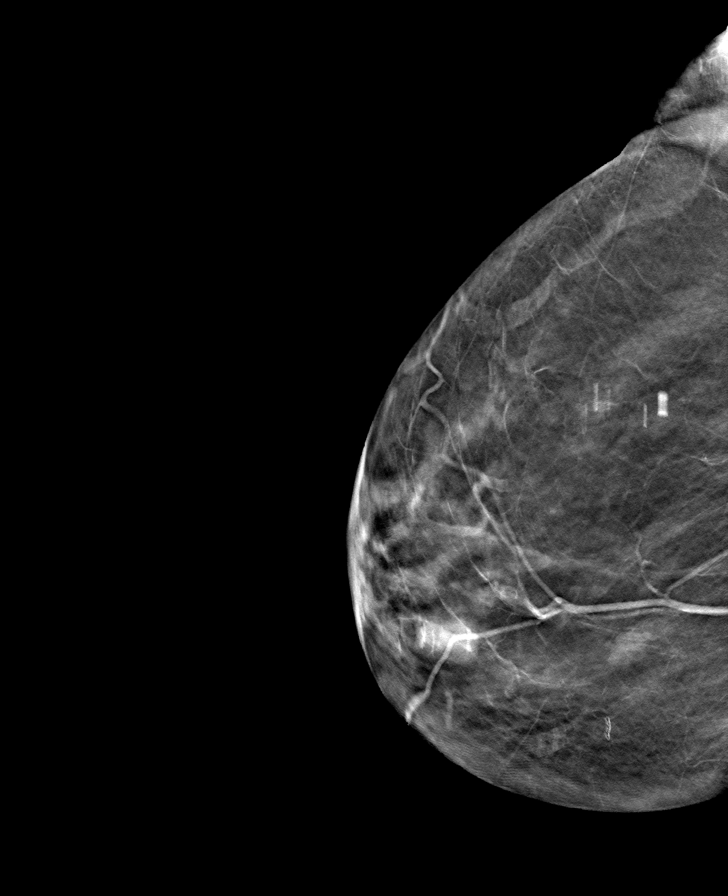

[8 of 40 positions shown; findings below may reference images not displayed]

ACR Breast Density Category b: There are scattered areas of
fibroglandular density.
FINDINGS: There are no findings suspicious for malignancy. Images were
processed with CAD.
IMPRESSION: No mammographic evidence of malignancy. A result letter of this
screening mammogram will be mailed directly to the patient.

RECOMMENDATION:
Screening mammogram in one year. (Code:[id number])

BI-RADS CATEGORY  1: Negative.

## 2021-11-24 DIAGNOSIS — Z6827 Body mass index (BMI) 27.0-27.9, adult: Secondary | ICD-10-CM | POA: Diagnosis not present

## 2021-11-24 DIAGNOSIS — D329 Benign neoplasm of meninges, unspecified: Secondary | ICD-10-CM | POA: Diagnosis not present

## 2021-11-24 DIAGNOSIS — I10 Essential (primary) hypertension: Secondary | ICD-10-CM | POA: Diagnosis not present

## 2021-11-27 ENCOUNTER — Other Ambulatory Visit: Payer: Self-pay | Admitting: Family Medicine

## 2021-11-27 DIAGNOSIS — E1169 Type 2 diabetes mellitus with other specified complication: Secondary | ICD-10-CM

## 2021-12-12 DIAGNOSIS — B351 Tinea unguium: Secondary | ICD-10-CM | POA: Diagnosis not present

## 2021-12-12 DIAGNOSIS — Z794 Long term (current) use of insulin: Secondary | ICD-10-CM | POA: Diagnosis not present

## 2021-12-12 DIAGNOSIS — E119 Type 2 diabetes mellitus without complications: Secondary | ICD-10-CM | POA: Diagnosis not present

## 2021-12-15 ENCOUNTER — Other Ambulatory Visit: Payer: Self-pay | Admitting: Family Medicine

## 2021-12-15 DIAGNOSIS — F411 Generalized anxiety disorder: Secondary | ICD-10-CM

## 2021-12-23 DIAGNOSIS — H524 Presbyopia: Secondary | ICD-10-CM | POA: Diagnosis not present

## 2021-12-23 DIAGNOSIS — H53421 Scotoma of blind spot area, right eye: Secondary | ICD-10-CM | POA: Diagnosis not present

## 2021-12-23 DIAGNOSIS — H52203 Unspecified astigmatism, bilateral: Secondary | ICD-10-CM | POA: Diagnosis not present

## 2021-12-23 DIAGNOSIS — D329 Benign neoplasm of meninges, unspecified: Secondary | ICD-10-CM | POA: Diagnosis not present

## 2021-12-28 ENCOUNTER — Ambulatory Visit (INDEPENDENT_AMBULATORY_CARE_PROVIDER_SITE_OTHER): Payer: HMO | Admitting: Family Medicine

## 2021-12-28 VITALS — BP 126/72 | HR 76 | Temp 97.8°F | Ht 69.5 in | Wt 188.0 lb

## 2021-12-28 DIAGNOSIS — Z794 Long term (current) use of insulin: Secondary | ICD-10-CM

## 2021-12-28 DIAGNOSIS — J309 Allergic rhinitis, unspecified: Secondary | ICD-10-CM

## 2021-12-28 DIAGNOSIS — E1169 Type 2 diabetes mellitus with other specified complication: Secondary | ICD-10-CM

## 2021-12-28 DIAGNOSIS — Z789 Other specified health status: Secondary | ICD-10-CM | POA: Diagnosis not present

## 2021-12-28 DIAGNOSIS — I10 Essential (primary) hypertension: Secondary | ICD-10-CM

## 2021-12-28 DIAGNOSIS — J479 Bronchiectasis, uncomplicated: Secondary | ICD-10-CM | POA: Diagnosis not present

## 2021-12-28 DIAGNOSIS — E782 Mixed hyperlipidemia: Secondary | ICD-10-CM | POA: Diagnosis not present

## 2021-12-28 LAB — LIPID PANEL
Cholesterol: 226 mg/dL — ABNORMAL HIGH (ref 0–200)
HDL: 107.7 mg/dL (ref >39.00)
LDL Cholesterol: 108 mg/dL — ABNORMAL HIGH (ref 0–99)
NonHDL: 118.33
Total CHOL/HDL Ratio: 2
Triglycerides: 54 mg/dL (ref 0.0–149.0)
VLDL: 10.8 mg/dL (ref 0.0–40.0)

## 2021-12-28 LAB — HEMOGLOBIN A1C: Hgb A1c MFr Bld: 7 % — ABNORMAL HIGH (ref 4.6–6.5)

## 2021-12-28 LAB — GLUCOSE, RANDOM: Glucose, Bld: 137 mg/dL — ABNORMAL HIGH (ref 70–99)

## 2021-12-28 MED ORDER — ROSUVASTATIN CALCIUM 20 MG PO TABS: 20.0000 mg | ORAL_TABLET | Freq: Every day | ORAL | 3 refills | Status: AC

## 2021-12-28 NOTE — Addendum Note (Signed)
Addended by: Haydee Salter on: 12/28/2021 03:33 PM ? ? Modules accepted: Orders ? ?

## 2021-12-28 NOTE — Progress Notes (Signed)
?[facility] ?[place] ?[place] [address] ?Dept: [phone] ?Dept Fax: [phone] ? ?Chronic Care Office Visit ? ?Subjective:  ? ? Patient ID: Patriece Archbold, female    DOB: [birth year], 76 y.o..   MRN: [id number] ? ?Chief Complaint  ?Patient presents with  ? Follow-up  ?  3 month f/u, fasting today. C/o having spots/biting inside and wants a handicap placard.   BS at home [id number]  ? ? ?History of Present Illness: ? ?Patient is in today for reassessment of chronic medical issues. ? ?Ms. Potvin has a history of hypertension. She has been off of her antihypertensives due to orthostatic symptoms. Her orthostasis has been improved. ?  ?She has hyperlipidemia and is on a daily aspirin and simvastatin. Cardiology increased her dose to 80 mg in Jan. ?  ?Ms. Hietpas also has a history of Type 2 diabetes and is managed on Novulin 70/30, 16 units each morning and 20 units each evening. She notes her fasting blood sugars have been in the 90-110 range. This morning it was 152, but she knows she ate heavier yesterday. ?  ?Ms. Lutzke has a history of bronchiectasis. She is currently on Breo Ellipta and albuterol. At times she still feels some breathlessness and notes a generalized sense of fatigue. She asks about a disabled parking placard. She does have to rest when walking distances as long as 200 feet. ?  ?Ms. Sachdev has a history of allergic rhinitis. She notes that more recently she has had trouble breathing through her right nostril. She is not having significant rhinorrhea. She has used a steroid nasal spray at times, but had not been using this recently. ? ?Ms. Funari notes some difficulties she is having at home. She finds that she struggles with keeping up with routine household duties. She also has limitations related to transportation. She used to get assistance with rides to get to medical appointments, but she states that program stopped. Her daughter has had a  previous stroke, so cannot help her with things. Her more distant relatives are busy, so do not have time to help her out. ? ?Past Medical History: ?Patient Active Problem List  ? Diagnosis Date Noted  ? Cervical disc disease 09/27/2021  ? Meningioma of left sphenoid wing without invasion of cavernous sinus ([place]) 08/16/2021  ? Seizure-like activity ([place]) 08/06/2021  ? Bronchiectasis ([place]) 08/06/2021  ? Elevated sed rate 04/27/2020  ? Syncope 04/21/2020  ? Orthostatic hypotension 04/21/2020  ? Hammertoes of both feet 05/29/2019  ? Astigmatism with presbyopia, bilateral 11/15/2018  ? Type 2 diabetes mellitus ([place]) 12/06/2016  ? Coccygeal pain, chronic 06/15/2016  ? Osteoporosis 02/23/2016  ? Elevated ferritin 02/01/2016  ? Age-related nuclear cataract of both eyes 01/22/2016  ? Mounier-Kuhn bronchiectasis with acute exacerbation ([place]) 10/21/2015  ? Breast mass, right 09/14/2015  ? Renal mass 09/14/2015  ? Gastrointestinal stromal tumor (GIST) ([place]) 08/26/2015  ? History of esophageal dilatation 05/25/2015  ? PVD (posterior vitreous detachment), right eye 02/04/2015  ? Vitreous floaters of both eyes 02/04/2015  ? Diverticulosis of large intestine without hemorrhage 01/11/2015  ? Essential hypertension 09/21/2014  ? Allergic rhinitis 01/23/2014  ? Carotid artery stenosis, asymptomatic 01/23/2014  ? Hyperlipidemia 01/23/2014  ? Anemia 01/23/2014  ? Atopic dermatitis 01/23/2014  ? Constipation, chronic 01/23/2014  ? Esophageal reflux 06/19/2013  ? Background diabetic retinopathy ([place]) 02/22/2013  ? Generalized anxiety disorder 02/22/2013  ? Obesity 02/22/2013  ? Hearing loss 02/22/2013  ? Urinary incontinence 02/22/2013  ? ?  Past Surgical History:  ?Procedure Laterality Date  ? ABDOMINAL HYSTERECTOMY    ? BREAST BIOPSY Right   ? COLONOSCOPY  2021  ? HAND SURGERY Right   ? cyst removed  ? STOMACH SURGERY    ? ?Family History  ?Problem Relation Age of Onset  ? Alcohol abuse Mother   ? Diabetes Father   ? Hypertension Sister    ? Cancer Sister   ?     Throat  ? Cancer Sister   ?     Breast  ? Hypertension Brother   ? Diabetes Brother   ? Cancer Brother   ?     Prostate  ? Cancer Brother   ?     Prostate  ? Cancer Brother   ?     Prostate  ? Cancer Brother   ?     Prostate  ? Stroke Daughter   ? ?Outpatient Medications Prior to Visit  ?Medication Sig Dispense Refill  ? albuterol (VENTOLIN HFA) 108 (90 Base) MCG/ACT inhaler Inhale 2 puffs into the lungs every 4 (four) hours as needed.    ? alendronate (FOSAMAX) 70 MG tablet Take 1 tablet (70 mg total) by mouth every 7 (seven) days. Take with a full glass of water on an empty stomach. 4 tablet 11  ? aspirin EC 81 MG tablet Take 81 mg by mouth daily.    ? Blood Glucose Calibration (ACCU-CHEK AVIVA) SOLN USE AS DIRECTED    ? Blood Glucose Monitoring Suppl (ACCU-CHEK AVIVA) device Use daily to check blood sugar daily    ? BREO ELLIPTA 200-25 MCG/ACT AEPB Inhale 1 puff into the lungs daily.    ? calcium-vitamin D (OSCAL WITH D) 500-5 MG-MCG tablet Take 1 tablet by mouth 2 (two) times daily. 180 tablet 3  ? gabapentin (NEURONTIN) 100 MG capsule Take 1 capsule by mouth every morning, 1 capsule every afternoon and 2 capsules at bedtime 120 capsule 3  ? glipiZIDE (GLUCOTROL XL) 5 MG 24 hr tablet TAKE 1 TABLET BY MOUTH EVERY MORNING 30 tablet 5  ? insulin isophane & regular human KwikPen (NOVOLIN 70/30 KWIKPEN) (70-30) 100 UNIT/ML KwikPen Inject 16 units into the skin before breakfast and 20 units into skin before supper 15 mL 11  ? Insulin Pen Needle (BD PEN NEEDLE NANO 2ND GEN) 32G X 4 MM MISC USE TO INJECT INSULIN TWICE DAILY AS DIRECTED 200 each 1  ? Lancet Device MISC For use with Onetouch Ultra Lancets to check blood sugar twice daily 1 each 0  ? Lancets (ONETOUCH ULTRASOFT) lancets USE TO CHECK BLOOD SUGAR TWICE DAILY 100 each 3  ? mirabegron ER (MYRBETRIQ) 50 MG TB24 tablet Take 1 tablet by mouth daily.    ? Multiple Vitamin (MULTI-VITAMIN DAILY PO) Take 1 tablet by mouth daily.    ?  omeprazole (PRILOSEC) 40 MG capsule TAKE 1 CAPSULE BY MOUTH TWICE DAILY 180 capsule 0  ? ONETOUCH ULTRA test strip USE ONCE EVERY MORNING AND ONCE EVERY NIGHT AT BEDTIME 200 strip 0  ? oxybutynin (DITROPAN-XL) 5 MG 24 hr tablet Take 1 tablet (5 mg total) by mouth at bedtime. 90 tablet 1  ? pyridostigmine (MESTINON) 60 MG tablet Take 0.5 tablets (30 mg total) by mouth 3 (three) times daily. 126 tablet 5  ? simvastatin (ZOCOR) 80 MG tablet Take 1 tablet (80 mg total) by mouth daily at 6 PM. 90 tablet 3  ? triamcinolone cream (KENALOG) 0.1 % Apply topically 2 (two) times daily as needed.    ?  ferrous sulfate 325 (65 FE) MG tablet Take 1 tablet by mouth daily with breakfast.    ? fluticasone (FLONASE) 50 MCG/ACT nasal spray Place into the nose.    ? folic acid (FOLVITE) 297 MCG tablet Take by mouth.    ? sulfamethoxazole-trimethoprim (BACTRIM DS) 800-160 MG tablet Take 1 tablet by mouth 2 (two) times daily. 20 tablet 0  ? ?No facility-administered medications prior to visit.  ? ?Allergies  ?Allergen Reactions  ? Celexa [Citalopram Hydrobromide] Other (See Comments)  ?  Drowsiness  ? Metformin Hcl Diarrhea  ? Montelukast   ?  Other reaction(s): Other (See Comments) ?Dizziness ?Dizziness ?  ? Tetanus Toxoids   ?  Arm extremely painful, arm turned red and swollen  ? Alendronate Nausea Only  ?  And loose stools. Patient states that she can and wants to continue this medication at this time. ?And loose stools. Patient states that she can and wants to continue this medication at this time. ?  ?  ?Objective:  ? ?Today's Vitals  ? 12/28/21 0920  ?BP: 126/72  ?Pulse: 76  ?Temp: 97.8 ?F (36.6 ?C)  ?TempSrc: Temporal  ?SpO2: 98%  ?Weight: 188 lb (85.3 kg)  ?Height: 5' 9.5" (1.765 m)  ? ?Body mass index is 27.36 kg/m?.  ? ?General: Well developed, well nourished. No acute distress. ?Psych: Alert and oriented. Normal mood and affect. ? ?Health Maintenance Due  ?Topic Date Due  ? TETANUS/TDAP  Never done  ? Zoster Vaccines- Shingrix  (1 of 2) Never done  ?   ?Assessment & Plan:  ? ?1. Type 2 diabetes mellitus with other specified complication, with long-term current use of insulin ([place]) ?We are due to check quarterly labs to assess diabete

## 2022-01-02 ENCOUNTER — Other Ambulatory Visit: Payer: Self-pay | Admitting: Neurology

## 2022-01-06 ENCOUNTER — Telehealth: Payer: Self-pay | Admitting: Family Medicine

## 2022-01-06 NOTE — Telephone Encounter (Signed)
Pt is wondering if she should be taking this script rosuvastatin (CRESTOR) 20 MG tablet [[id number]]. She feels the mg's are different. Please advise pt at [phone] ?

## 2022-01-09 NOTE — Telephone Encounter (Signed)
Spoke to patient, advised her to stop the Simvastatin and only take to Rosuvastatin .  No further questions. Dm/cma ? ?

## 2022-01-19 ENCOUNTER — Other Ambulatory Visit: Payer: Self-pay | Admitting: Nurse Practitioner

## 2022-01-19 DIAGNOSIS — Z794 Long term (current) use of insulin: Secondary | ICD-10-CM

## 2022-01-24 NOTE — Telephone Encounter (Signed)
Chart supports Rx ?Last seen 12/28/2021 ?Next appointment 03/29/2022 ?

## 2022-02-02 DIAGNOSIS — J479 Bronchiectasis, uncomplicated: Secondary | ICD-10-CM | POA: Diagnosis not present

## 2022-02-02 DIAGNOSIS — R0602 Shortness of breath: Secondary | ICD-10-CM | POA: Diagnosis not present

## 2022-02-04 ENCOUNTER — Other Ambulatory Visit: Payer: Self-pay | Admitting: Nurse Practitioner

## 2022-02-04 DIAGNOSIS — Z794 Long term (current) use of insulin: Secondary | ICD-10-CM

## 2022-02-04 DIAGNOSIS — E1169 Type 2 diabetes mellitus with other specified complication: Secondary | ICD-10-CM

## 2022-02-06 NOTE — Telephone Encounter (Signed)
Chart Supports Rx Next Ov: 7/23 Last OV: 4/23

## 2022-02-08 DIAGNOSIS — J479 Bronchiectasis, uncomplicated: Secondary | ICD-10-CM | POA: Diagnosis not present

## 2022-02-13 ENCOUNTER — Other Ambulatory Visit: Payer: Self-pay | Admitting: Family Medicine

## 2022-02-13 DIAGNOSIS — E1169 Type 2 diabetes mellitus with other specified complication: Secondary | ICD-10-CM

## 2022-03-10 ENCOUNTER — Other Ambulatory Visit: Payer: Self-pay | Admitting: Family Medicine

## 2022-03-10 ENCOUNTER — Telehealth: Payer: Self-pay | Admitting: Family Medicine

## 2022-03-10 DIAGNOSIS — R32 Unspecified urinary incontinence: Secondary | ICD-10-CM

## 2022-03-10 DIAGNOSIS — Z794 Long term (current) use of insulin: Secondary | ICD-10-CM

## 2022-03-10 MED ORDER — ALCOHOL PREP PAD 70 % PADS: 1.0000 | MEDICATED_PAD | 2 refills | Status: AC

## 2022-03-11 ENCOUNTER — Other Ambulatory Visit: Payer: Self-pay | Admitting: Family Medicine

## 2022-03-11 DIAGNOSIS — M810 Age-related osteoporosis without current pathological fracture: Secondary | ICD-10-CM

## 2022-03-15 ENCOUNTER — Other Ambulatory Visit: Payer: Self-pay | Admitting: Nurse Practitioner

## 2022-03-15 DIAGNOSIS — I951 Orthostatic hypotension: Secondary | ICD-10-CM

## 2022-03-16 NOTE — Telephone Encounter (Signed)
Refill request for  pyridostigmine 60 mg  LR 02/11/21, #126, 5 rf LOV 12/28/21 FOV 03/29/22  Please review and advise. Thanks.  Dm/cma

## 2022-03-17 DIAGNOSIS — R059 Cough, unspecified: Secondary | ICD-10-CM | POA: Diagnosis not present

## 2022-03-17 DIAGNOSIS — I252 Old myocardial infarction: Secondary | ICD-10-CM | POA: Diagnosis not present

## 2022-03-17 DIAGNOSIS — R5383 Other fatigue: Secondary | ICD-10-CM | POA: Diagnosis not present

## 2022-03-17 DIAGNOSIS — R42 Dizziness and giddiness: Secondary | ICD-10-CM | POA: Diagnosis not present

## 2022-03-17 DIAGNOSIS — I517 Cardiomegaly: Secondary | ICD-10-CM | POA: Diagnosis not present

## 2022-03-17 DIAGNOSIS — I700 Atherosclerosis of aorta: Secondary | ICD-10-CM | POA: Diagnosis not present

## 2022-03-17 DIAGNOSIS — R5381 Other malaise: Secondary | ICD-10-CM | POA: Diagnosis not present

## 2022-03-17 DIAGNOSIS — R519 Headache, unspecified: Secondary | ICD-10-CM | POA: Diagnosis not present

## 2022-03-17 DIAGNOSIS — R0602 Shortness of breath: Secondary | ICD-10-CM | POA: Diagnosis not present

## 2022-03-17 DIAGNOSIS — R600 Localized edema: Secondary | ICD-10-CM | POA: Diagnosis not present

## 2022-03-17 DIAGNOSIS — R531 Weakness: Secondary | ICD-10-CM | POA: Diagnosis not present

## 2022-03-17 DIAGNOSIS — R079 Chest pain, unspecified: Secondary | ICD-10-CM | POA: Diagnosis not present

## 2022-03-17 DIAGNOSIS — Z77098 Contact with and (suspected) exposure to other hazardous, chiefly nonmedicinal, chemicals: Secondary | ICD-10-CM | POA: Diagnosis not present

## 2022-03-17 DIAGNOSIS — J471 Bronchiectasis with (acute) exacerbation: Secondary | ICD-10-CM | POA: Diagnosis not present

## 2022-03-17 DIAGNOSIS — I493 Ventricular premature depolarization: Secondary | ICD-10-CM | POA: Diagnosis not present

## 2022-03-19 DIAGNOSIS — I493 Ventricular premature depolarization: Secondary | ICD-10-CM | POA: Diagnosis not present

## 2022-03-29 ENCOUNTER — Ambulatory Visit: Payer: HMO | Admitting: Family Medicine

## 2022-04-04 ENCOUNTER — Telehealth: Payer: Self-pay

## 2022-04-04 NOTE — Telephone Encounter (Signed)
Called & LVM adv pt to call back to schedule AWV

## 2022-05-08 ENCOUNTER — Other Ambulatory Visit: Payer: Self-pay | Admitting: Family Medicine

## 2022-05-08 DIAGNOSIS — E1169 Type 2 diabetes mellitus with other specified complication: Secondary | ICD-10-CM

## 2022-05-12 ENCOUNTER — Other Ambulatory Visit: Payer: Self-pay | Admitting: Nurse Practitioner

## 2022-05-12 DIAGNOSIS — E1169 Type 2 diabetes mellitus with other specified complication: Secondary | ICD-10-CM

## 2022-05-15 ENCOUNTER — Telehealth: Payer: Self-pay

## 2022-05-15 NOTE — Telephone Encounter (Signed)
Reached out to patient regarding receiving a PA for the One touch Ultra test strips.  Patient states that she has changed PCP to another office 2 months ago.  She will reach out to her new PCP to have them check on this.  Dm/cma

## 2022-06-02 ENCOUNTER — Telehealth: Payer: Self-pay | Admitting: Family Medicine

## 2022-06-02 NOTE — Telephone Encounter (Signed)
I called patient to schedule AWV.  Patient said she is seeing a new PCP. Please remove PCP.

## 2022-06-19 ENCOUNTER — Other Ambulatory Visit: Payer: Self-pay | Admitting: Family Medicine

## 2022-06-19 DIAGNOSIS — K219 Gastro-esophageal reflux disease without esophagitis: Secondary | ICD-10-CM

## 2022-07-04 ENCOUNTER — Other Ambulatory Visit: Payer: Self-pay | Admitting: Family Medicine

## 2022-07-04 DIAGNOSIS — E1169 Type 2 diabetes mellitus with other specified complication: Secondary | ICD-10-CM

## 2022-07-08 ENCOUNTER — Other Ambulatory Visit: Payer: Self-pay | Admitting: Family Medicine

## 2022-07-08 DIAGNOSIS — I10 Essential (primary) hypertension: Secondary | ICD-10-CM

## 2022-09-12 ENCOUNTER — Other Ambulatory Visit: Payer: Self-pay | Admitting: Family Medicine

## 2022-09-12 DIAGNOSIS — R32 Unspecified urinary incontinence: Secondary | ICD-10-CM

## 2022-10-30 ENCOUNTER — Other Ambulatory Visit: Payer: Self-pay | Admitting: Family Medicine

## 2022-10-30 DIAGNOSIS — I10 Essential (primary) hypertension: Secondary | ICD-10-CM

## 2022-11-19 ENCOUNTER — Emergency Department (HOSPITAL_BASED_OUTPATIENT_CLINIC_OR_DEPARTMENT_OTHER)
Admission: EM | Admit: 2022-11-19 | Discharge: 2022-11-19 | Disposition: A | Discharge status: Home / Self Care | Payer: 59 | Attending: Emergency Medicine | Admitting: Emergency Medicine

## 2022-11-19 ENCOUNTER — Other Ambulatory Visit: Payer: Self-pay

## 2022-11-19 ENCOUNTER — Emergency Department (HOSPITAL_BASED_OUTPATIENT_CLINIC_OR_DEPARTMENT_OTHER): Payer: 59

## 2022-11-19 ENCOUNTER — Encounter (HOSPITAL_BASED_OUTPATIENT_CLINIC_OR_DEPARTMENT_OTHER): Payer: Self-pay | Admitting: Emergency Medicine

## 2022-11-19 DIAGNOSIS — R531 Weakness: Secondary | ICD-10-CM | POA: Insufficient documentation

## 2022-11-19 DIAGNOSIS — Z794 Long term (current) use of insulin: Secondary | ICD-10-CM | POA: Insufficient documentation

## 2022-11-19 DIAGNOSIS — I10 Essential (primary) hypertension: Secondary | ICD-10-CM | POA: Diagnosis not present

## 2022-11-19 DIAGNOSIS — E119 Type 2 diabetes mellitus without complications: Secondary | ICD-10-CM | POA: Diagnosis not present

## 2022-11-19 DIAGNOSIS — Z7982 Long term (current) use of aspirin: Secondary | ICD-10-CM | POA: Insufficient documentation

## 2022-11-19 DIAGNOSIS — Z79899 Other long term (current) drug therapy: Secondary | ICD-10-CM | POA: Diagnosis not present

## 2022-11-19 DIAGNOSIS — Z7984 Long term (current) use of oral hypoglycemic drugs: Secondary | ICD-10-CM | POA: Diagnosis not present

## 2022-11-19 LAB — CBC WITH DIFFERENTIAL/PLATELET
Abs Immature Granulocytes: 0.04 10*3/uL (ref 0.00–0.07)
Basophils Absolute: 0 10*3/uL (ref 0.0–0.1)
Basophils Relative: 0 %
Eosinophils Absolute: 0 10*3/uL (ref 0.0–0.5)
Eosinophils Relative: 0 %
HCT: 36.1 % (ref 36.0–46.0)
Hemoglobin: 11.3 g/dL — ABNORMAL LOW (ref 12.0–15.0)
Immature Granulocytes: 0 %
Lymphocytes Relative: 22 %
Lymphs Abs: 2.8 10*3/uL (ref 0.7–4.0)
MCH: 21 pg — ABNORMAL LOW (ref 26.0–34.0)
MCHC: 31.3 g/dL (ref 30.0–36.0)
MCV: 67 fL — ABNORMAL LOW (ref 80.0–100.0)
Monocytes Absolute: 0.9 10*3/uL (ref 0.1–1.0)
Monocytes Relative: 7 %
Neutro Abs: 9 10*3/uL — ABNORMAL HIGH (ref 1.7–7.7)
Neutrophils Relative %: 71 %
Platelets: 228 10*3/uL (ref 150–400)
RBC: 5.39 MIL/uL — ABNORMAL HIGH (ref 3.87–5.11)
RDW: 15.9 % — ABNORMAL HIGH (ref 11.5–15.5)
WBC: 12.7 10*3/uL — ABNORMAL HIGH (ref 4.0–10.5)
nRBC: 0 % (ref 0.0–0.2)

## 2022-11-19 LAB — URINALYSIS, MICROSCOPIC (REFLEX)
Bacteria, UA: NONE SEEN
RBC / HPF: NONE SEEN RBC/hpf (ref 0–5)

## 2022-11-19 LAB — COMPREHENSIVE METABOLIC PANEL
ALT: 34 U/L (ref 0–44)
AST: 27 U/L (ref 15–41)
Albumin: 3.1 g/dL — ABNORMAL LOW (ref 3.5–5.0)
Alkaline Phosphatase: 76 U/L (ref 38–126)
Anion gap: 7 (ref 5–15)
BUN: 10 mg/dL (ref 8–23)
CO2: 27 mmol/L (ref 22–32)
Calcium: 9.1 mg/dL (ref 8.9–10.3)
Chloride: 99 mmol/L (ref 98–111)
Creatinine, Ser: 0.72 mg/dL (ref 0.44–1.00)
GFR, Estimated: >60 mL/min (ref >60)
Glucose, Bld: 296 mg/dL — ABNORMAL HIGH (ref 70–99)
Potassium: 3.8 mmol/L (ref 3.5–5.1)
Sodium: 133 mmol/L — ABNORMAL LOW (ref 135–145)
Total Bilirubin: 0.7 mg/dL (ref 0.3–1.2)
Total Protein: 8.1 g/dL (ref 6.5–8.1)

## 2022-11-19 LAB — URINALYSIS, ROUTINE W REFLEX MICROSCOPIC
Bilirubin Urine: NEGATIVE
Glucose, UA: 250 mg/dL — AB
Hgb urine dipstick: NEGATIVE
Ketones, ur: NEGATIVE mg/dL
Nitrite: NEGATIVE
Protein, ur: NEGATIVE mg/dL
Specific Gravity, Urine: 1.01 (ref 1.005–1.030)
pH: 5.5 (ref 5.0–8.0)

## 2022-11-19 NOTE — Discharge Instructions (Signed)
Follow-up with your primary care doctor tomorrow as scheduled.  Recommend that they get you some physical therapy outpatient.  Follow-up with your neurologist.  Your workup today is normal including your head CT.

## 2022-11-19 NOTE — ED Triage Notes (Addendum)
Pt sts she could not stand when trying to get out of car 1 hour PTA; she felt her whole body was weak; this is the second time this has happened since she was discharged from the hospital 2/17 and was offered rehab, but she declined; was able to transfer from car to w/c with one assist upon arrival to ED; denies injury from fall, sts she feels sore all over

## 2022-11-19 NOTE — ED Notes (Signed)
Pt discharged to home. Discharge instructions have been discussed with patient and/or family members. Pt verbally acknowledges understanding d/c instructions, and endorses comprehension to checkout at registration before leaving.  °

## 2022-11-19 NOTE — ED Notes (Addendum)
Pt stood and pivoted from car to locked wheelchair x1 MAX assist due to c/o weak legs + "multiple complaints". In NAD, A&O.

## 2022-11-19 NOTE — ED Provider Notes (Signed)
[facility] Provider Note   CSN: [id number] Arrival date & time: 11/19/22  1235     History  Chief Complaint  Patient presents with   Gait Problem    Candice Pineda is a 77 y.o. female.  Patient here with generalized weakness.  On and off for the last 2 weeks that she was discharged to the hospital with [facility].  She has not started on Keppra.  She had an episode today where her knees gave out and she fell very weak.  She did not lose consciousness or hit her head.  She is back at her baseline now.  She has had some deconditioning since being in the hospital.  She was recommended for skilled nursing prior to being discharged but she declined it.  She has a walker and wheelchair at home.  She has follow-up with her primary care doctor tomorrow.  She denies any chest pain or shortness of breath or abdominal pain or nausea or vomiting.  The history is provided by the patient.       Home Medications Prior to Admission medications   Medication Sig Start Date End Date Taking? Authorizing Provider  albuterol (VENTOLIN HFA) 108 (90 Base) MCG/ACT inhaler Inhale 2 puffs into the lungs every 4 (four) hours as needed. 08/25/21   [provider]  Alcohol Swabs (ALCOHOL PREP PAD) 70 % PADS 1 Box by Does not apply route as directed. 03/10/22   Haydee Salter, MD  alendronate (FOSAMAX) 70 MG tablet TAKE 1 TABLET(70 MG) BY MOUTH EVERY 7 DAYS WITH A FULL GLASS OF WATER AND ON AN EMPTY STOMACH 03/12/22   Haydee Salter, MD  aspirin EC 81 MG tablet Take 81 mg by mouth daily.    [provider]  BD PEN NEEDLE NANO 2ND GEN 32G X 4 MM MISC USE TO INJECT INSULIN TWICE DAILY AS DIRECTED 05/08/22   Haydee Salter, MD  Blood Glucose Calibration (ACCU-CHEK AVIVA) SOLN USE AS DIRECTED 06/12/18   [provider]  Blood Glucose Monitoring Suppl (ACCU-CHEK AVIVA) device Use daily to check blood sugar daily 08/14/18   [provider]  BREO  ELLIPTA 200-25 MCG/ACT AEPB Inhale 1 puff into the lungs daily. 08/25/21   [provider]  calcium-vitamin D (OSCAL WITH D) 500-5 MG-MCG tablet Take 1 tablet by mouth 2 (two) times daily. 09/27/21   Haydee Salter, MD  ferrous sulfate 325 (65 FE) MG tablet Take 1 tablet by mouth daily with breakfast.    [provider]  fluticasone (FLONASE) 50 MCG/ACT nasal spray Place into the nose.    [provider]  folic acid (FOLVITE) A999333 MCG tablet Take by mouth.    [provider]  gabapentin (NEURONTIN) 100 MG capsule TAKE ONE CAPSULE BY MOUTH EVERY MORNING, 1 CAPSULE EVERY AFTERNOON, AND 2 CAPSULES EVERY [place] AT BEDTIME 01/03/22   Sater, Richard A, MD  glipiZIDE (GLUCOTROL XL) 5 MG 24 hr tablet TAKE 1 TABLET BY MOUTH EVERY MORNING 02/13/22   Haydee Salter, MD  insulin isophane & regular human KwikPen (NOVOLIN 70/30 KWIKPEN) (70-30) 100 UNIT/ML KwikPen INJECT 10 UNITS UNDER THE SKIN IN [place], AND 15 UNITS AT BEDTIME 01/24/22   Haydee Salter, MD  Lancet Device MISC For use with Onetouch Ultra Lancets to check blood sugar twice daily 03/07/21   [name], [name], NP  Lancets [facility] ULTRASOFT) lancets USE TO CHECK BLOOD SUGAR TWICE DAILY 07/04/22   Arlester Marker  M, MD  mirabegron ER (MYRBETRIQ) 50 MG TB24 tablet Take 1 tablet by mouth daily. 01/22/21   [provider]  Multiple Vitamin (MULTI-VITAMIN DAILY PO) Take 1 tablet by mouth daily.    [provider]  omeprazole (PRILOSEC) 40 MG capsule TAKE 1 CAPSULE BY MOUTH TWICE DAILY 09/16/21   Haydee Salter, MD  [facility] ULTRA test strip USE TO TEST EVERY MORNING AND EVERY [place]ined[place] AT BEDTIME 05/12/22   [name], [name], NP  oxybutynin (DITROPAN-XL) 5 MG 24 hr tablet TAKE 1 TABLET(5 MG) BY MOUTH AT BEDTIME 09/12/22   Haydee Salter, MD  potassium chloride SA (KLOR-CON M) 20 MEQ tablet TAKE 1 TABLET BY MOUTH DAILY 10/31/22   Bonnita Hollow, MD  pyridostigmine (MESTINON) 60 MG tablet TAKE 1/2 TABLET(30  MG) BY MOUTH THREE TIMES DAILY 03/16/22   Haydee Salter, MD  rosuvastatin (CRESTOR) 20 MG tablet Take 1 tablet (20 mg total) by mouth daily. 12/28/21   Haydee Salter, MD  triamcinolone cream (KENALOG) 0.1 % Apply topically 2 (two) times daily as needed. 09/13/21   [provider]      Allergies    Celexa [citalopram hydrobromide], Metformin hcl, Montelukast, Tetanus toxoids, and Alendronate    Review of Systems   Review of Systems  Physical Exam Updated Vital Signs BP (!) 131/53   Pulse 80   Temp 98.6 F (37 C) (Oral)   Resp 16   Ht 5' 9.5" (1.765 m)   Wt 84.4 kg   SpO2 98%   BMI 27.07 kg/m  Physical Exam Vitals and nursing note reviewed.  Constitutional:      General: She is not in acute distress.    Appearance: She is well-developed. She is not ill-appearing.  HENT:     Head: Normocephalic and atraumatic.     Nose: Nose normal.     Mouth/Throat:     Mouth: Mucous membranes are moist.  Eyes:     Extraocular Movements: Extraocular movements intact.     Conjunctiva/sclera: Conjunctivae normal.     Pupils: Pupils are equal, round, and reactive to light.  Cardiovascular:     Rate and Rhythm: Normal rate and regular rhythm.     Pulses: Normal pulses.     Heart sounds: Normal heart sounds. No murmur heard. Pulmonary:     Effort: Pulmonary effort is normal. No respiratory distress.     Breath sounds: Normal breath sounds.  Abdominal:     Palpations: Abdomen is soft.     Tenderness: There is no abdominal tenderness.  Musculoskeletal:        General: No swelling.     Cervical back: Normal range of motion and neck supple.  Skin:    General: Skin is warm and dry.     Capillary Refill: Capillary refill takes less than 2 seconds.  Neurological:     General: No focal deficit present.     Mental Status: She is alert and oriented to person, place, and time.     Cranial Nerves: No cranial nerve deficit.     Sensory: No sensory deficit.     Motor: No weakness.      Coordination: Coordination normal.     Comments: 5+ out of 5 strength, normal sensation does need some assistance with ambulation but she states that this is basically been her baseline for the last few weeks  Psychiatric:        Mood and Affect: Mood normal.     ED Results /  Procedures / Treatments   Labs (all labs ordered are listed, but only abnormal results are displayed) Labs Reviewed  CBC WITH DIFFERENTIAL/PLATELET - Abnormal; Notable for the following components:      Result Value   WBC 12.7 (*)    RBC 5.39 (*)    Hemoglobin 11.3 (*)    MCV 67.0 (*)    MCH 21.0 (*)    RDW 15.9 (*)    Neutro Abs 9.0 (*)    All other components within normal limits  COMPREHENSIVE METABOLIC PANEL - Abnormal; Notable for the following components:   Sodium 133 (*)    Glucose, Bld 296 (*)    Albumin 3.1 (*)    All other components within normal limits  URINALYSIS, ROUTINE W REFLEX MICROSCOPIC - Abnormal; Notable for the following components:   Glucose, UA 250 (*)    Leukocytes,Ua TRACE (*)    All other components within normal limits  URINALYSIS, MICROSCOPIC (REFLEX)    EKG None  Radiology CT Head Wo Contrast  Result Date: 11/19/2022 CLINICAL DATA:  Headache, increasing frequency or severity. EXAM: CT HEAD WITHOUT CONTRAST TECHNIQUE: Contiguous axial images were obtained from the base of the skull through the vertex without intravenous contrast. RADIATION DOSE REDUCTION: This exam was performed according to the departmental dose-optimization program which includes automated exposure control, adjustment of the mA and/or kV according to patient size and/or use of iterative reconstruction technique. COMPARISON:  CT head dated November 01, 2022 FINDINGS: Brain: No evidence of acute infarction, hemorrhage, hydrocephalus, extra-axial collection or mass lesion/mass effect. 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 Vascular: No hyperdense vessel or unexpected calcification. Skull: Normal.  Negative for fracture or focal lesion. Sinuses/Orbits: No acute finding. Other: None. IMPRESSION: 1. No acute intracranial abnormality seen. 2. 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 Electronically Signed   By: Keane Police D.O.   On: 11/19/2022 15:45   DG Chest 2 View  Result Date: 11/19/2022 CLINICAL DATA:  Pain.  Weakness. EXAM: CHEST - 2 VIEW COMPARISON:  08/06/2021 FINDINGS: Normal heart size. No pleural fluid or airspace disease. No interstitial edema identified. Visualized osseous structures appear intact. IMPRESSION: No active cardiopulmonary abnormalities. Electronically Signed   By: Kerby Moors M.D.   On: 11/19/2022 15:38    Procedures Procedures    Medications Ordered in ED Medications - No data to display  ED Course/ Medical Decision Making/ A&P                             Medical Decision Making Amount and/or Complexity of Data Reviewed Labs: ordered. Radiology: ordered.   Deem Candice Pineda is here with generalized weakness.  Normal vitals.  No fever.  History of diabetes, hypertension, meningioma, recently started on Keppra for suspected seizures.  Differential today includes general deconditioning, dehydration, pneumonia or UTI.  Possible that she could have had a very atypical seizure but this seems less likely.  CBC, BMP, chest x-ray, urinalysis, head CT was obtained.  Per my review and interpretation of labs no significant anemia or electrolyte abnormality or kidney injury.  No UTI.  Chest x-ray negative for infection per my review and interpretation.  Head CT per radiology with no acute findings.  Overall she is at her baseline.  Suspect that she has some ongoing deconditioning from her recent hospitalization.  She has follow-up with her primary care doctor tomorrow.  She had extensive neurologic and cardiac workup at her recent hospitalization.  I  do not think there is any acute neurologic or cardiac process going on.  Family feels comfortable with discharge.  She  does have assistive devices at home but recommend that she talk with her primary care doctor tomorrow about outpatient rehab.  Discharged in good condition.  This chart was dictated using voice recognition software.  Despite best efforts to proofread,  errors can occur which can change the documentation meaning.         Final Clinical Impression(s) / ED Diagnoses Final diagnoses:  Weakness    Rx / DC Orders ED Discharge Orders     None         Lennice Sites, DO 11/19/22 1559

## 2023-02-01 ENCOUNTER — Other Ambulatory Visit: Payer: Self-pay | Admitting: Family Medicine

## 2023-02-01 DIAGNOSIS — M810 Age-related osteoporosis without current pathological fracture: Secondary | ICD-10-CM

## 2023-04-12 ENCOUNTER — Other Ambulatory Visit: Payer: Self-pay | Admitting: Family Medicine

## 2023-04-12 DIAGNOSIS — E1169 Type 2 diabetes mellitus with other specified complication: Secondary | ICD-10-CM

## 2023-07-03 ENCOUNTER — Other Ambulatory Visit: Payer: Self-pay | Admitting: Neurological Surgery

## 2023-07-03 DIAGNOSIS — D329 Benign neoplasm of meninges, unspecified: Secondary | ICD-10-CM

## 2023-07-26 ENCOUNTER — Other Ambulatory Visit: Payer: Self-pay | Admitting: Family Medicine

## 2023-07-26 DIAGNOSIS — I10 Essential (primary) hypertension: Secondary | ICD-10-CM

## 2023-08-09 ENCOUNTER — Encounter: Payer: Self-pay | Admitting: Neurological Surgery

## 2023-08-18 ENCOUNTER — Emergency Department (HOSPITAL_BASED_OUTPATIENT_CLINIC_OR_DEPARTMENT_OTHER): Payer: Medicare HMO

## 2023-08-18 ENCOUNTER — Other Ambulatory Visit: Payer: Self-pay

## 2023-08-18 ENCOUNTER — Emergency Department (HOSPITAL_BASED_OUTPATIENT_CLINIC_OR_DEPARTMENT_OTHER)
Admission: EM | Admit: 2023-08-18 | Discharge: 2023-08-18 | Disposition: A | Discharge status: Home / Self Care | Payer: Medicare HMO

## 2023-08-18 ENCOUNTER — Encounter (HOSPITAL_BASED_OUTPATIENT_CLINIC_OR_DEPARTMENT_OTHER): Payer: Self-pay

## 2023-08-18 DIAGNOSIS — Z794 Long term (current) use of insulin: Secondary | ICD-10-CM | POA: Diagnosis not present

## 2023-08-18 DIAGNOSIS — Z7982 Long term (current) use of aspirin: Secondary | ICD-10-CM | POA: Diagnosis not present

## 2023-08-18 DIAGNOSIS — K5730 Diverticulosis of large intestine without perforation or abscess without bleeding: Secondary | ICD-10-CM | POA: Diagnosis not present

## 2023-08-18 DIAGNOSIS — Z20822 Contact with and (suspected) exposure to covid-19: Secondary | ICD-10-CM | POA: Insufficient documentation

## 2023-08-18 DIAGNOSIS — R4182 Altered mental status, unspecified: Secondary | ICD-10-CM | POA: Diagnosis present

## 2023-08-18 DIAGNOSIS — J101 Influenza due to other identified influenza virus with other respiratory manifestations: Secondary | ICD-10-CM | POA: Insufficient documentation

## 2023-08-18 DIAGNOSIS — J111 Influenza due to unidentified influenza virus with other respiratory manifestations: Secondary | ICD-10-CM

## 2023-08-18 DIAGNOSIS — R531 Weakness: Secondary | ICD-10-CM | POA: Diagnosis not present

## 2023-08-18 LAB — RESP PANEL BY RT-PCR (RSV, FLU A&B, COVID)  RVPGX2
Influenza A by PCR: POSITIVE — AB
Influenza B by PCR: NEGATIVE
Resp Syncytial Virus by PCR: NEGATIVE
SARS Coronavirus 2 by RT PCR: NEGATIVE

## 2023-08-18 LAB — CBC WITH DIFFERENTIAL/PLATELET
Abs Immature Granulocytes: 0.03 10*3/uL (ref 0.00–0.07)
Basophils Absolute: 0 10*3/uL (ref 0.0–0.1)
Basophils Relative: 0 %
Eosinophils Absolute: 0 10*3/uL (ref 0.0–0.5)
Eosinophils Relative: 0 %
HCT: 39 % (ref 36.0–46.0)
Hemoglobin: 12.4 g/dL (ref 12.0–15.0)
Immature Granulocytes: 0 %
Lymphocytes Relative: 14 %
Lymphs Abs: 1.1 10*3/uL (ref 0.7–4.0)
MCH: 21.9 pg — ABNORMAL LOW (ref 26.0–34.0)
MCHC: 31.8 g/dL (ref 30.0–36.0)
MCV: 68.8 fL — ABNORMAL LOW (ref 80.0–100.0)
Monocytes Absolute: 1.5 10*3/uL — ABNORMAL HIGH (ref 0.1–1.0)
Monocytes Relative: 19 %
Neutro Abs: 5 10*3/uL (ref 1.7–7.7)
Neutrophils Relative %: 67 %
Platelets: 191 10*3/uL (ref 150–400)
RBC: 5.67 MIL/uL — ABNORMAL HIGH (ref 3.87–5.11)
RDW: 15.9 % — ABNORMAL HIGH (ref 11.5–15.5)
WBC: 7.6 10*3/uL (ref 4.0–10.5)
nRBC: 0 % (ref 0.0–0.2)

## 2023-08-18 LAB — HEPATIC FUNCTION PANEL
ALT: 17 U/L (ref 0–44)
AST: 28 U/L (ref 15–41)
Albumin: 3.3 g/dL — ABNORMAL LOW (ref 3.5–5.0)
Alkaline Phosphatase: 53 U/L (ref 38–126)
Bilirubin, Direct: 0.2 mg/dL (ref 0.0–0.2)
Indirect Bilirubin: 0.2 mg/dL — ABNORMAL LOW (ref 0.3–0.9)
Total Bilirubin: 0.4 mg/dL (ref <1.2)
Total Protein: 6.8 g/dL (ref 6.5–8.1)

## 2023-08-18 LAB — LIPASE, BLOOD: Lipase: 23 U/L (ref 11–51)

## 2023-08-18 LAB — I-STAT CHEM 8, ED
BUN: 16 mg/dL (ref 8–23)
Calcium, Ion: 1.13 mmol/L — ABNORMAL LOW (ref 1.15–1.40)
Chloride: 100 mmol/L (ref 98–111)
Creatinine, Ser: 0.9 mg/dL (ref 0.44–1.00)
Glucose, Bld: 161 mg/dL — ABNORMAL HIGH (ref 70–99)
HCT: 42 % (ref 36.0–46.0)
Hemoglobin: 14.3 g/dL (ref 12.0–15.0)
Potassium: 4.1 mmol/L (ref 3.5–5.1)
Sodium: 136 mmol/L (ref 135–145)
TCO2: 27 mmol/L (ref 22–32)

## 2023-08-18 LAB — GROUP A STREP BY PCR: Group A Strep by PCR: NOT DETECTED

## 2023-08-18 LAB — LACTIC ACID, PLASMA: Lactic Acid, Venous: 1.3 mmol/L (ref 0.5–1.9)

## 2023-08-18 LAB — CBG MONITORING, ED: Glucose-Capillary: 178 mg/dL — ABNORMAL HIGH (ref 70–99)

## 2023-08-18 MED ORDER — OSELTAMIVIR PHOSPHATE 75 MG PO CAPS
75.0000 mg | ORAL_CAPSULE | Freq: Once | ORAL | Status: AC
  Administered 2023-08-18: 75 mg via ORAL
  Filled 2023-08-18: qty 1

## 2023-08-18 MED ORDER — ONDANSETRON HCL 4 MG PO TABS: 4.0000 mg | ORAL_TABLET | Freq: Four times a day (QID) | ORAL | 0 refills | Status: AC

## 2023-08-18 MED ORDER — SODIUM CHLORIDE 0.9 % IV BOLUS
1000.0000 mL | Freq: Once | INTRAVENOUS | Status: AC
  Administered 2023-08-18: 1000 mL via INTRAVENOUS

## 2023-08-18 MED ORDER — IOHEXOL 300 MG/ML  SOLN
75.0000 mL | Freq: Once | INTRAMUSCULAR | Status: AC | PRN
  Administered 2023-08-18: 75 mL via INTRAVENOUS

## 2023-08-18 MED ORDER — OSELTAMIVIR PHOSPHATE 75 MG PO CAPS: 75.0000 mg | ORAL_CAPSULE | Freq: Two times a day (BID) | ORAL | 0 refills | Status: AC

## 2023-08-18 NOTE — ED Notes (Signed)
Pt informed of need for UA sample, will notify when she needs to use the restroom

## 2023-08-18 NOTE — Discharge Instructions (Addendum)
As discussed, please try to maintain adequate hydration with frequent sips of water, low sugar Gatorade.  May take over-the-counter Tylenol alternating with ibuprofen for fevers.  We are prescribing Tamiflu.  Take Zofran as needed for nausea.  Return immediately if symptoms worsen, inability drink due to nausea vomiting.  Chest pain, shortness of breath, persistent fevers, severe abdominal pain, or any new or worsening symptoms that are concerning to you.

## 2023-08-18 NOTE — ED Triage Notes (Signed)
Pt brought in by her daughter who reports she has not been "acting right" for 2 days. She is slower to answer questions, more weak than usual and and daughter doesn't think she took her insulin today. Pt reports her throat has been hurting for a month but has trouble elaborating. LSN 2 days ago by daughter. Pt is only alert to self but she is usually A&Ox4, per daughter.

## 2023-08-18 NOTE — ED Provider Notes (Signed)
[facility] Provider Note   CSN: [id number] Arrival date & time: 08/18/23  1558     History  Chief Complaint  Patient presents with   Altered Mental Status    Candice Pineda is a 77 y.o. female.  77 year old female present emergency department for altered mental status.  Daughter notes that mother last known normal 2 days ago since that time she has been seemingly less active, slower to respond and with generalized weakness.  No other particular complaints, sore throat, but present for the past month.  Triage note the patient is only alert to self, but she is alert and oriented x 3 on my interview.  She has no specific complaints denies chest pain shortness of breath.  Was seen a week ago for abdominal pain .   Altered Mental Status      Home Medications Prior to Admission medications   Medication Sig Start Date End Date Taking? Authorizing Provider  albuterol (VENTOLIN HFA) 108 (90 Base) MCG/ACT inhaler Inhale 2 puffs into the lungs every 4 (four) hours as needed. 08/25/21   [provider]  Alcohol Swabs (ALCOHOL PREP PAD) 70 % PADS 1 Box by Does not apply route as directed. 03/10/22   Loyola Mast, MD  alendronate (FOSAMAX) 70 MG tablet TAKE 1 TABLET(70 MG) BY MOUTH EVERY 7 DAYS WITH A FULL GLASS OF WATER AND ON AN EMPTY STOMACH 02/01/23   Loyola Mast, MD  aspirin EC 81 MG tablet Take 81 mg by mouth daily.    [provider]  BD PEN NEEDLE NANO 2ND GEN 32G X 4 MM MISC USE TO INJECT INSULIN TWICE DAILY AS DIRECTED 05/08/22   Loyola Mast, MD  Blood Glucose Calibration (ACCU-CHEK AVIVA) SOLN USE AS DIRECTED 06/12/18   [provider]  Blood Glucose Monitoring Suppl (ACCU-CHEK AVIVA) device Use daily to check blood sugar daily 08/14/18   [provider]  BREO ELLIPTA 200-25 MCG/ACT AEPB Inhale 1 puff into the lungs daily. 08/25/21   [provider]  calcium-vitamin D (OSCAL WITH D) 500-5  MG-MCG tablet Take 1 tablet by mouth 2 (two) times daily. 09/27/21   Loyola Mast, MD  ferrous sulfate 325 (65 FE) MG tablet Take 1 tablet by mouth daily with breakfast.    [provider]  fluticasone (FLONASE) 50 MCG/ACT nasal spray Place into the nose.    [provider]  folic acid (FOLVITE) 400 MCG tablet Take by mouth.    [provider]  gabapentin (NEURONTIN) 100 MG capsule TAKE ONE CAPSULE BY MOUTH EVERY MORNING, 1 CAPSULE EVERY AFTERNOON, AND 2 CAPSULES EVERY NIGHT AT BEDTIME 01/03/22   Sater, Richard A, MD  glipiZIDE (GLUCOTROL XL) 5 MG 24 hr tablet TAKE 1 TABLET BY MOUTH EVERY MORNING 04/12/23   Loyola Mast, MD  insulin isophane & regular human KwikPen (NOVOLIN 70/30 KWIKPEN) (70-30) 100 UNIT/ML KwikPen INJECT 10 UNITS UNDER THE SKIN IN [place], AND 15 UNITS AT BEDTIME 01/24/22   Loyola Mast, MD  Lancet Device MISC For use with Onetouch Ultra Lancets to check blood sugar twice daily 03/07/21   [name], Bonna Gains, NP  Lancets [facility] ULTRASOFT) lancets USE TO CHECK BLOOD SUGAR TWICE DAILY 07/04/22   Loyola Mast, MD  mirabegron ER (MYRBETRIQ) 50 MG TB24 tablet Take 1 tablet by mouth daily. 01/22/21   [provider]  Multiple Vitamin (MULTI-VITAMIN DAILY PO) Take 1 tablet by mouth daily.  [provider]  omeprazole (PRILOSEC) 40 MG capsule TAKE 1 CAPSULE BY MOUTH TWICE DAILY 09/16/21   Loyola Mast, MD  [facility] ULTRA test strip USE TO TEST EVERY MORNING AND EVERY NIGHT AT BEDTIME 05/12/22   [name], Bonna Gains, NP  oxybutynin (DITROPAN-XL) 5 MG 24 hr tablet TAKE 1 TABLET(5 MG) BY MOUTH AT BEDTIME 09/12/22   Loyola Mast, MD  potassium chloride SA (KLOR-CON M) 20 MEQ tablet TAKE 1 TABLET BY MOUTH DAILY 10/31/22   Garnette Gunner, MD  pyridostigmine (MESTINON) 60 MG tablet TAKE 1/2 TABLET(30 MG) BY MOUTH THREE TIMES DAILY 03/16/22   Loyola Mast, MD  rosuvastatin (CRESTOR) 20 MG tablet Take 1 tablet (20 mg total) by mouth  daily. 12/28/21   Loyola Mast, MD  triamcinolone cream (KENALOG) 0.1 % Apply topically 2 (two) times daily as needed. 09/13/21   [provider]      Allergies    Celexa [citalopram hydrobromide], Metformin hcl, Montelukast, Tetanus toxoids, and Alendronate    Review of Systems   Review of Systems  Physical Exam Updated Vital Signs BP 132/63   Pulse 88   Temp (!) 100.4 F (38 C) (Tympanic)   Resp 19   Ht 5\' 9"  (1.753 m)   Wt 84.4 kg   SpO2 100%   BMI 27.47 kg/m  Physical Exam Vitals and nursing note reviewed.  Constitutional:      General: She is not in acute distress.    Appearance: She is not ill-appearing or toxic-appearing.  HENT:     Nose: Nose normal.     Mouth/Throat:     Mouth: Mucous membranes are moist.  Eyes:     Conjunctiva/sclera: Conjunctivae normal.  Cardiovascular:     Rate and Rhythm: Regular rhythm.  Pulmonary:     Effort: Pulmonary effort is normal.     Breath sounds: Normal breath sounds.  Abdominal:     General: Abdomen is flat. There is no distension.     Palpations: Abdomen is soft.     Tenderness: There is no abdominal tenderness. There is no guarding or rebound.  Musculoskeletal:        General: Normal range of motion.     Cervical back: Normal range of motion and neck supple.  Skin:    General: Skin is warm and dry.     Capillary Refill: Capillary refill takes less than 2 seconds.  Neurological:     General: No focal deficit present.     Mental Status: She is oriented to person, place, and time.     Cranial Nerves: No cranial nerve deficit.     Sensory: No sensory deficit.     Motor: No weakness.     Coordination: Coordination normal.  Psychiatric:        Mood and Affect: Mood normal.        Behavior: Behavior normal.     ED Results / Procedures / Treatments   Labs (all labs ordered are listed, but only abnormal results are displayed) Labs Reviewed  RESP PANEL BY RT-PCR (RSV, FLU A&B, COVID)  RVPGX2 - Abnormal;  Notable for the following components:      Result Value   Influenza A by PCR POSITIVE (*)    All other components within normal limits  HEPATIC FUNCTION PANEL - Abnormal; Notable for the following components:   Albumin 3.3 (*)    Indirect Bilirubin 0.2 (*)    All other components within normal limits  CBC WITH  DIFFERENTIAL/PLATELET - Abnormal; Notable for the following components:   RBC 5.67 (*)    MCV 68.8 (*)    MCH 21.9 (*)    RDW 15.9 (*)    Monocytes Absolute 1.5 (*)    All other components within normal limits  CBG MONITORING, ED - Abnormal; Notable for the following components:   Glucose-Capillary 178 (*)    All other components within normal limits  I-STAT CHEM 8, ED - Abnormal; Notable for the following components:   Glucose, Bld 161 (*)    Calcium, Ion 1.13 (*)    All other components within normal limits  GROUP A STREP BY PCR  CULTURE, BLOOD (ROUTINE X 2)  CULTURE, BLOOD (ROUTINE X 2)  LACTIC ACID, PLASMA  LIPASE, BLOOD  CBG MONITORING, ED    EKG None  Radiology CT ABDOMEN PELVIS W CONTRAST  Result Date: 08/18/2023 CLINICAL DATA:  Altered mental status, sepsis. EXAM: CT ABDOMEN AND PELVIS WITH CONTRAST TECHNIQUE: Multidetector CT imaging of the abdomen and pelvis was performed using the standard protocol following bolus administration of intravenous contrast. RADIATION DOSE REDUCTION: This exam was performed according to the departmental dose-optimization program which includes automated exposure control, adjustment of the mA and/or kV according to patient size and/or use of iterative reconstruction technique. CONTRAST:  75mL OMNIPAQUE IOHEXOL 300 MG/ML  SOLN COMPARISON:  December 29, 2022. FINDINGS: Lower chest: No acute abnormality. Hepatobiliary: No focal liver abnormality is seen. No gallstones, gallbladder wall thickening, or biliary dilatation. Pancreas: Unremarkable. No pancreatic ductal dilatation or surrounding inflammatory changes. Spleen: Normal in size without  focal abnormality. Adrenals/Urinary Tract: Adrenal glands appear normal. Stable right renal cyst for which no further follow-up is required. No hydronephrosis or renal obstruction is noted. Urinary bladder is unremarkable. Stomach/Bowel: No acute gastric abnormality is noted. There is no evidence of bowel obstruction or inflammation. The appendix appears normal. Sigmoid diverticulosis without inflammation. Vascular/Lymphatic: Aortic atherosclerosis. No enlarged abdominal or pelvic lymph nodes. Reproductive: Status post hysterectomy. No adnexal masses. Other: No abdominal wall hernia or abnormality. No abdominopelvic ascites. Musculoskeletal: No acute or significant osseous findings. IMPRESSION: No acute abnormality seen in the abdomen or pelvis. Aortic Atherosclerosis (ICD10-I70.0). Electronically Signed   By: Lupita Raider M.D.   On: 08/18/2023 18:49   CT Head Wo Contrast  Result Date: 08/18/2023 CLINICAL DATA:  Mental status change, unknown cause EXAM: CT HEAD WITHOUT CONTRAST TECHNIQUE: Contiguous axial images were obtained from the base of the skull through the vertex without intravenous contrast. RADIATION DOSE REDUCTION: This exam was performed according to the departmental dose-optimization program which includes automated exposure control, adjustment of the mA and/or kV according to patient size and/or use of iterative reconstruction technique. COMPARISON:  CT head 11/19/2022 FINDINGS: Brain: Trace patchy and confluent areas of decreased attenuation are noted throughout the deep and periventricular white matter of the cerebral hemispheres bilaterally, compatible with chronic microvascular ischemic disease. no evidence of large-territorial acute infarction. No parenchymal hemorrhage. No mass lesion. No extra-axial collection. No mass effect or midline shift. No hydrocephalus. Basilar cisterns are patent. Vascular: No hyperdense vessel. Atherosclerotic calcifications are present within the cavernous  internal carotid arteries. Skull: No acute fracture or focal lesion. Sinuses/Orbits: Paranasal sinuses and mastoid air cells are clear. Right lens replacement. Otherwise the orbits are unremarkable. Other: None. IMPRESSION: No acute intracranial abnormality. Electronically Signed   By: Tish Frederickson M.D.   On: 08/18/2023 18:48   DG Chest Port 1 View  Result Date: 08/18/2023 CLINICAL DATA:  Questionable sepsis -  evaluate for abnormality EXAM: PORTABLE CHEST 1 VIEW COMPARISON:  Chest x-ray 08/08/2023 FINDINGS: The heart and mediastinal contours are within normal limits. Aortic calcification No focal consolidation. No pulmonary edema. No pleural effusion. No pneumothorax. No acute osseous abnormality. Bilateral acromioclavicular joint degenerative changes. IMPRESSION: 1. No active disease. 2.  Aortic Atherosclerosis (ICD10-I70.0). Electronically Signed   By: Tish Frederickson M.D.   On: 08/18/2023 17:00    Procedures Procedures    Medications Ordered in ED Medications  sodium chloride 0.9 % bolus 1,000 mL (0 mLs Intravenous Stopped 08/18/23 1907)  iohexol (OMNIPAQUE) 300 MG/ML solution 75 mL (75 mLs Intravenous Contrast Given 08/18/23 1810)  oseltamivir (TAMIFLU) capsule 75 mg (75 mg Oral Given 08/18/23 1845)    ED Course/ Medical Decision Making/ A&P Clinical Course as of 08/18/23 1952  Sat Aug 18, 2023  1718 DG Chest Port 1 View IMPRESSION: 1. No active disease. 2.  Aortic Atherosclerosis (ICD10-I70.0).   [TY]  1718 Group A Strep by PCR: NOT DETECTED [TY]  1728 I-stat chem 8, ED (not at [facility], [facility] or [facility])(!) No significant metabolic derangements. Normal kidney function  [TY]  1815 Influenza A By PCR(!): POSITIVE [TY]  1912 CT Head Wo Contrast IMPRESSION: No acute intracranial abnormality.   [TY]  1912 CT ABDOMEN PELVIS W CONTRAST IMPRESSION: No acute abnormality seen in the abdomen or pelvis.  Aortic Atherosclerosis (ICD10-I70.0).   [TY]  1947 Reevaluated.  Daughter states  that mother is back to normal self.  She is flu positive.  Was given Tamiflu.  She has no other systemic signs of infection.  No leukocytosis no elevated lactate.  Had vague complaint of sore throat, but seemingly this is subacute/chronic.  Strep test negative.  No significant metabolic derangements on CHEM panel.  No transaminitis to suggest hepatobiliary disease.  Lipase normal.  Given patient's initial concern for altered mental status CT head obtained.  Negative for acute pathology.  CT abdomen also with no acute pathology.  Chest x-ray without pneumonia.  Given patient clinically has improved and is back to neurologic baseline per family report feel that she is appropriate for discharge.  Daughter states that she will keep close eye on mother.  Will discharge with Tamiflu, Zofran.  Discussed maintaining adequate hydration.  Stable for discharge at this time. [TY]    Clinical Course User Index [TY] Coral Spikes, DO                                 Medical Decision Making This is a 77 year old female presenting emergency department for weakness, altered mental status.  Patient noted to have fever, tachycardia.  Hemodynamically stable.  Maintaining oxygen saturation on room air.  Concern for systemic infection, but no clear source.  Will get broad infectious workup.  Given reported altered mental status will get CT head.  Given she had some vague abdominal complaints recently will get CT abdomen.  Treat with IV fluids will hold off on antibiotics at this time as we have no clear source.   See ED course for further MDM and disposition.  Amount and/or Complexity of Data Reviewed Independent Historian:     Details: Daughter notes that mother has been lying around seemingly taking longer to respond to questions.  No focal deficits noted. External Data Reviewed:     Details: Recently seen for abdominal pain in the emergency department.  No CT scan done at that time. Labs: ordered. Decision-making  details documented in ED Course. Radiology: ordered. Decision-making details documented in ED Course.  Risk Prescription drug management. Decision regarding hospitalization.          Final Clinical Impression(s) / ED Diagnoses Final diagnoses:  None    Rx / DC Orders ED Discharge Orders     None         Coral Spikes, DO 08/18/23 1952

## 2023-08-18 NOTE — ED Notes (Signed)
ED Provider at bedside. 

## 2023-08-18 NOTE — ED Notes (Signed)
MD@bedside , attempt imaging in 10 min.

## 2023-08-24 LAB — CULTURE, BLOOD (ROUTINE X 2)
Culture: NO GROWTH
Culture: NO GROWTH
Special Requests: ADEQUATE
Special Requests: ADEQUATE

## 2023-08-25 ENCOUNTER — Ambulatory Visit
Admission: RE | Admit: 2023-08-25 | Discharge: 2023-08-25 | Disposition: A | Discharge status: Home / Self Care | Payer: Medicare HMO | Source: Ambulatory Visit | Attending: Neurological Surgery | Admitting: Neurological Surgery

## 2023-08-25 DIAGNOSIS — D329 Benign neoplasm of meninges, unspecified: Secondary | ICD-10-CM

## 2023-08-25 MED ORDER — GADOPICLENOL 0.5 MMOL/ML IV SOLN
8.0000 mL | Freq: Once | INTRAVENOUS | Status: AC | PRN
  Administered 2023-08-25: 8 mL via INTRAVENOUS

## 2023-10-18 ENCOUNTER — Other Ambulatory Visit: Payer: Self-pay | Admitting: Family Medicine

## 2023-10-18 DIAGNOSIS — R32 Unspecified urinary incontinence: Secondary | ICD-10-CM

## 2023-12-21 ENCOUNTER — Other Ambulatory Visit: Payer: Self-pay | Admitting: Family Medicine

## 2023-12-21 DIAGNOSIS — M810 Age-related osteoporosis without current pathological fracture: Secondary | ICD-10-CM

## 2024-01-05 ENCOUNTER — Other Ambulatory Visit: Payer: Self-pay | Admitting: Family Medicine

## 2024-01-05 DIAGNOSIS — M810 Age-related osteoporosis without current pathological fracture: Secondary | ICD-10-CM

## 2024-01-09 ENCOUNTER — Other Ambulatory Visit: Payer: Self-pay | Admitting: Family Medicine

## 2024-01-09 DIAGNOSIS — M810 Age-related osteoporosis without current pathological fracture: Secondary | ICD-10-CM

## 2024-04-17 ENCOUNTER — Other Ambulatory Visit: Payer: Self-pay | Admitting: Family Medicine

## 2024-04-17 DIAGNOSIS — E1169 Type 2 diabetes mellitus with other specified complication: Secondary | ICD-10-CM
